# Patient Record
Sex: Female | Born: 1970 | Race: White | Hispanic: No | Marital: Married | Smoking: Never smoker
Health system: Southern US, Community
[De-identification: ages and names within clinical notes are randomized; demographics above are authoritative.]

## PROBLEM LIST (undated history)

## (undated) DIAGNOSIS — J302 Other seasonal allergic rhinitis: Secondary | ICD-10-CM

## (undated) DIAGNOSIS — E119 Type 2 diabetes mellitus without complications: Secondary | ICD-10-CM

## (undated) DIAGNOSIS — T7840XA Allergy, unspecified, initial encounter: Secondary | ICD-10-CM

## (undated) DIAGNOSIS — R112 Nausea with vomiting, unspecified: Secondary | ICD-10-CM

## (undated) DIAGNOSIS — Z9889 Other specified postprocedural states: Secondary | ICD-10-CM

## (undated) DIAGNOSIS — R7303 Prediabetes: Secondary | ICD-10-CM

## (undated) HISTORY — DX: Other seasonal allergic rhinitis: J30.2

## (undated) HISTORY — DX: Type 2 diabetes mellitus without complications: E11.9

## (undated) HISTORY — DX: Nausea with vomiting, unspecified: R11.2

## (undated) HISTORY — DX: Prediabetes: R73.03

## (undated) HISTORY — DX: Allergy, unspecified, initial encounter: T78.40XA

## (undated) HISTORY — DX: Other specified postprocedural states: Z98.890

---
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
## 1996-08-27 HISTORY — PX: BREAST REDUCTION SURGERY: SHX8

## 2007-10-31 ENCOUNTER — Emergency Department (HOSPITAL_COMMUNITY): Admission: EM | Admit: 2007-10-31 | Discharge: 2007-10-31 | Payer: Self-pay | Admitting: Family Medicine

## 2009-10-11 ENCOUNTER — Emergency Department (HOSPITAL_COMMUNITY): Admission: EM | Admit: 2009-10-11 | Discharge: 2009-10-11 | Payer: Self-pay | Admitting: Family Medicine

## 2010-01-28 ENCOUNTER — Emergency Department (HOSPITAL_COMMUNITY): Admission: EM | Admit: 2010-01-28 | Discharge: 2010-01-28 | Payer: Self-pay | Admitting: Emergency Medicine

## 2010-04-01 ENCOUNTER — Ambulatory Visit (HOSPITAL_COMMUNITY): Admission: RE | Admit: 2010-04-01 | Discharge: 2010-04-01 | Payer: Self-pay | Admitting: Family Medicine

## 2010-04-01 ENCOUNTER — Emergency Department (HOSPITAL_COMMUNITY): Admission: EM | Admit: 2010-04-01 | Discharge: 2010-04-01 | Payer: Self-pay | Admitting: Family Medicine

## 2010-04-01 IMAGING — CR DG ANKLE COMPLETE 3+V*L*
3 series · 3 of 3 positions shown · non-contrast
Comparison: None

CLINICAL DATA: Lateral pain, swelling, and bruising status post
fall

LEFT ANKLE COMPLETE - 3+ VIEW

[t ankle joint ap left]
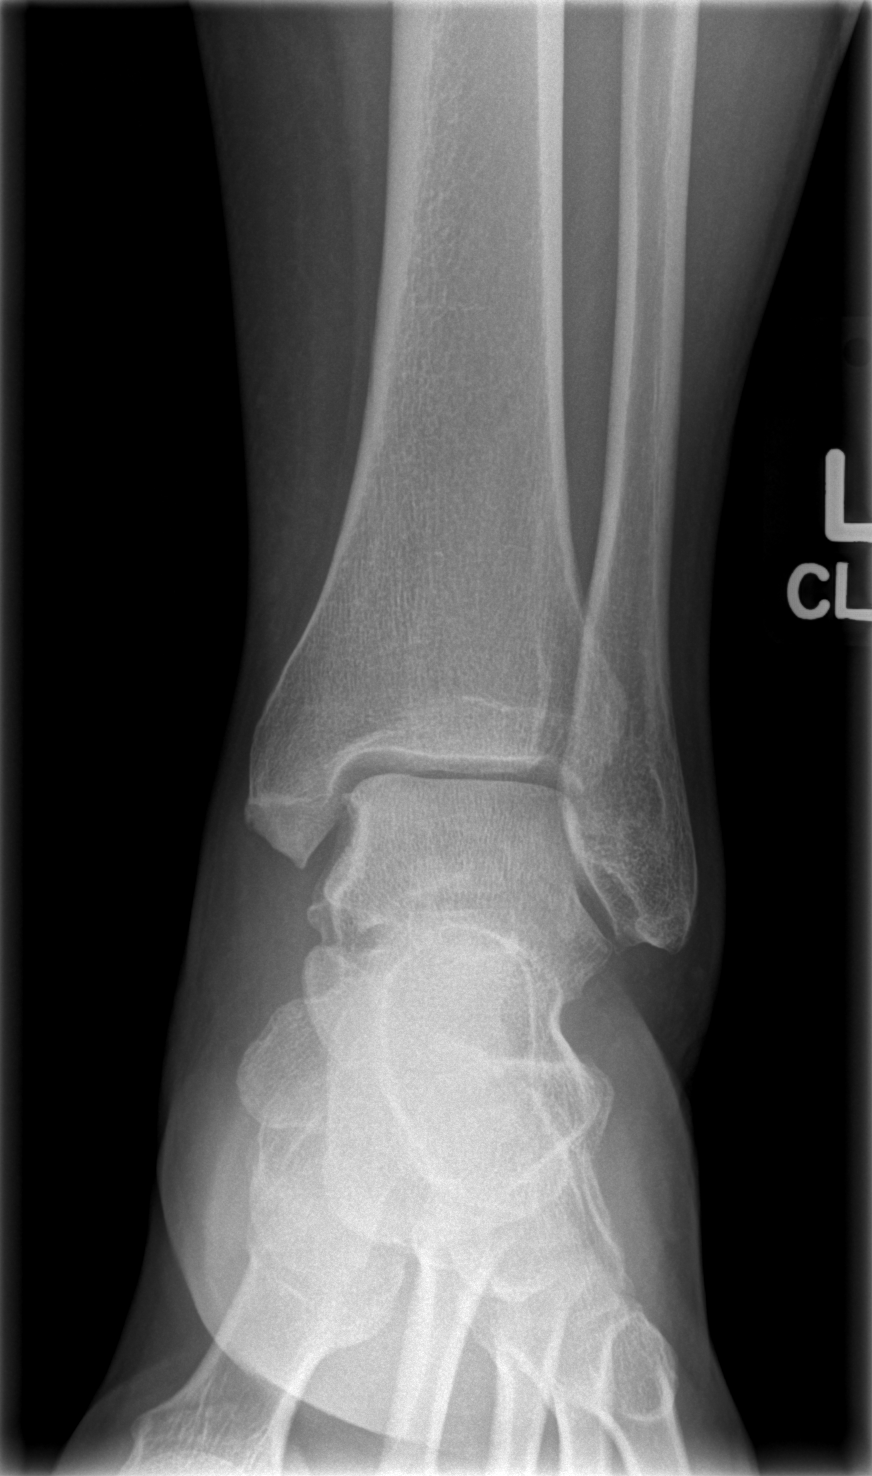

[t ankle joint oblique left]
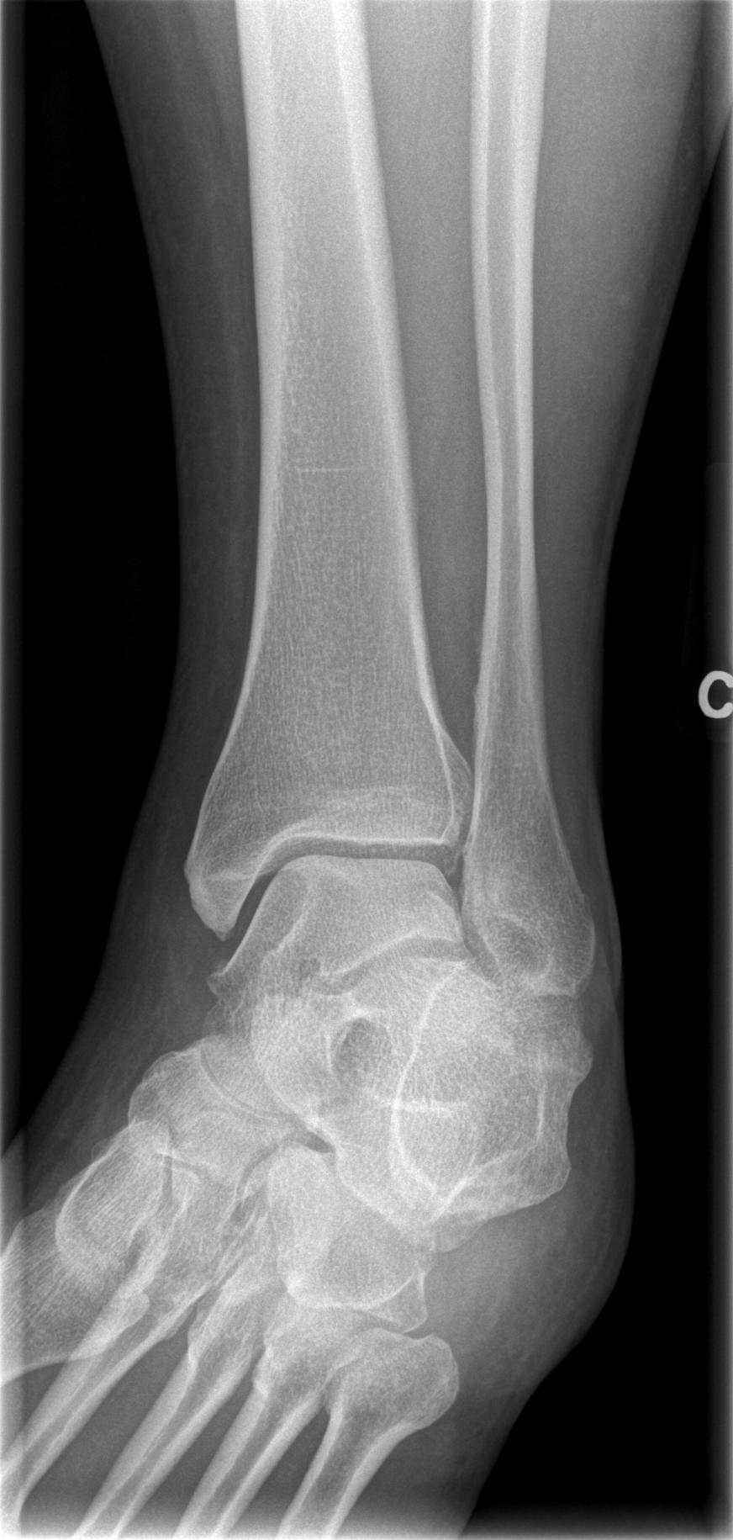

[t ankle joint lat left]
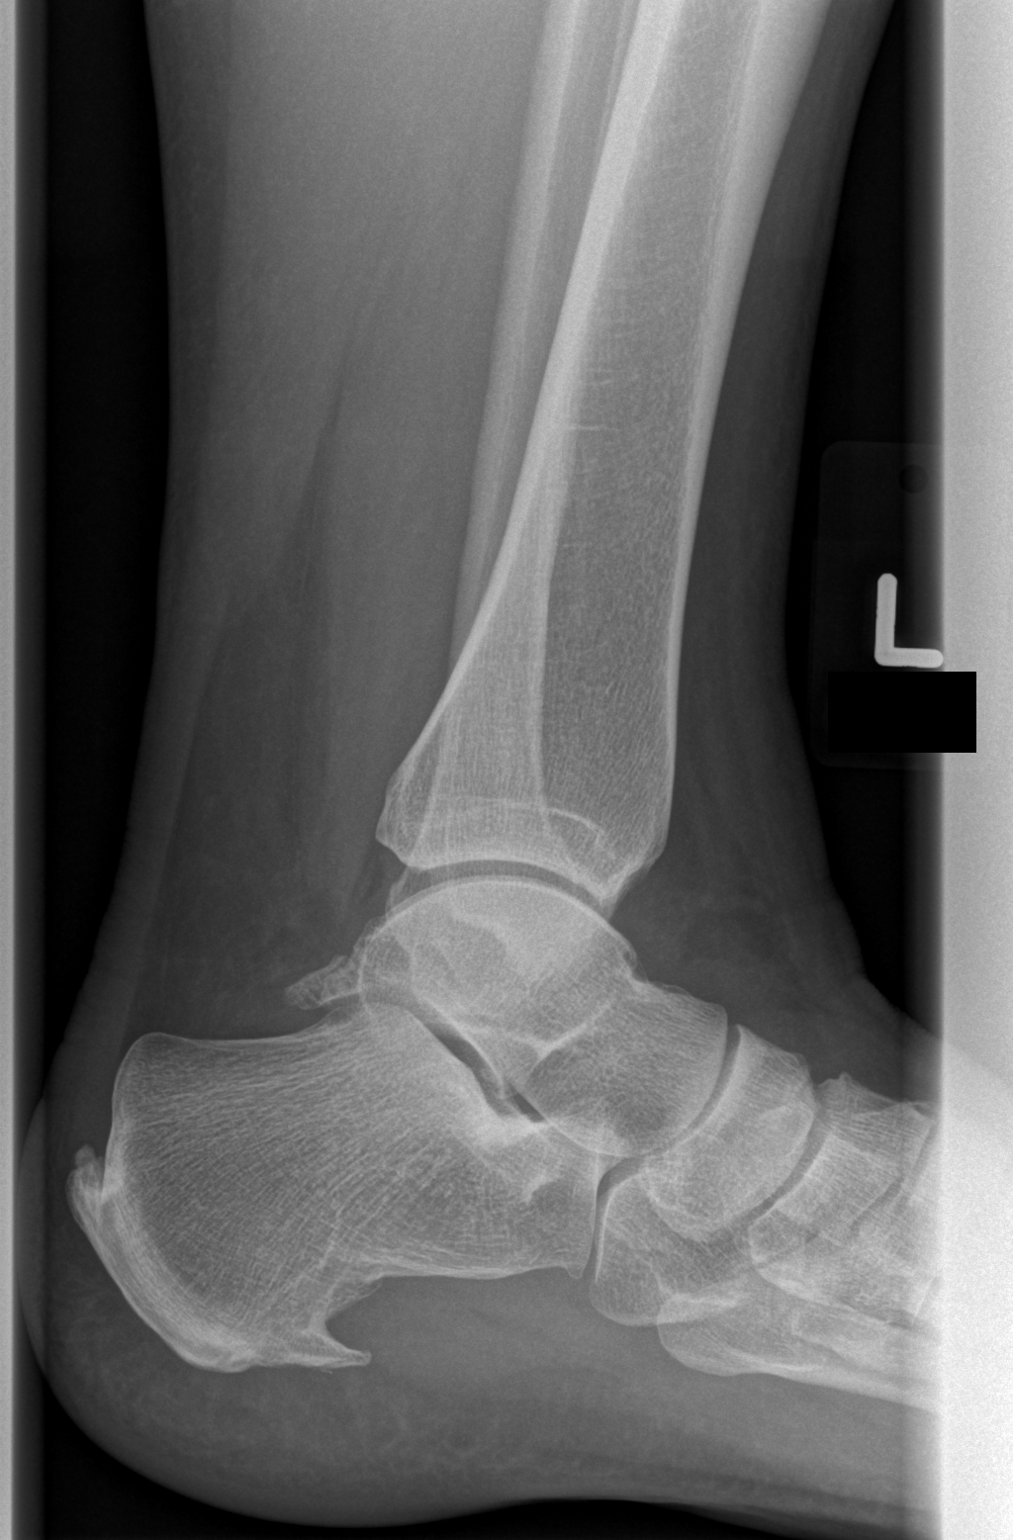

[3 of 3 positions shown; findings below may reference images not displayed]

FINDINGS: Ankle mortise intact.
Minimal soft tissue swelling.
Bone mineralization grossly normal.
No definite acute fracture, dislocation, or bone destruction.
IMPRESSION: No acute bony abnormalities.

## 2010-04-01 IMAGING — CR DG FOOT COMPLETE 3+V*L*
3 series · 3 of 3 positions shown · non-contrast
Comparison: None

CLINICAL DATA: Lateral pain, swelling and bruising status post fall

LEFT FOOT - COMPLETE 3+ VIEW

[t foot ap left]
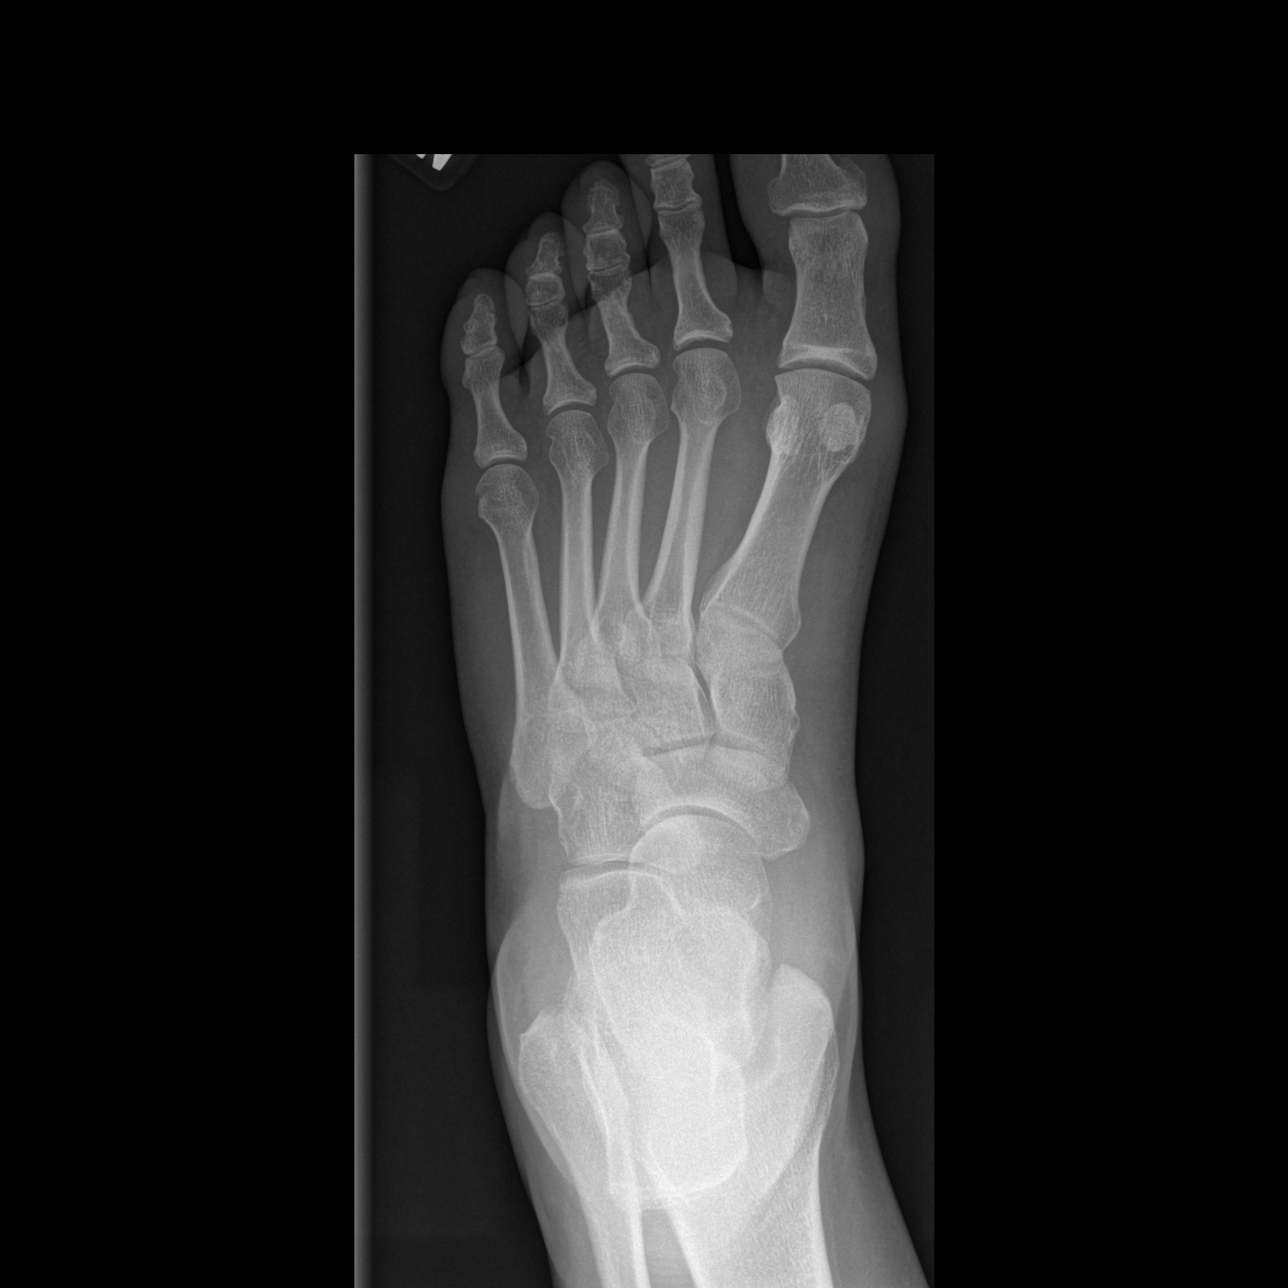

[t foot oblique left]
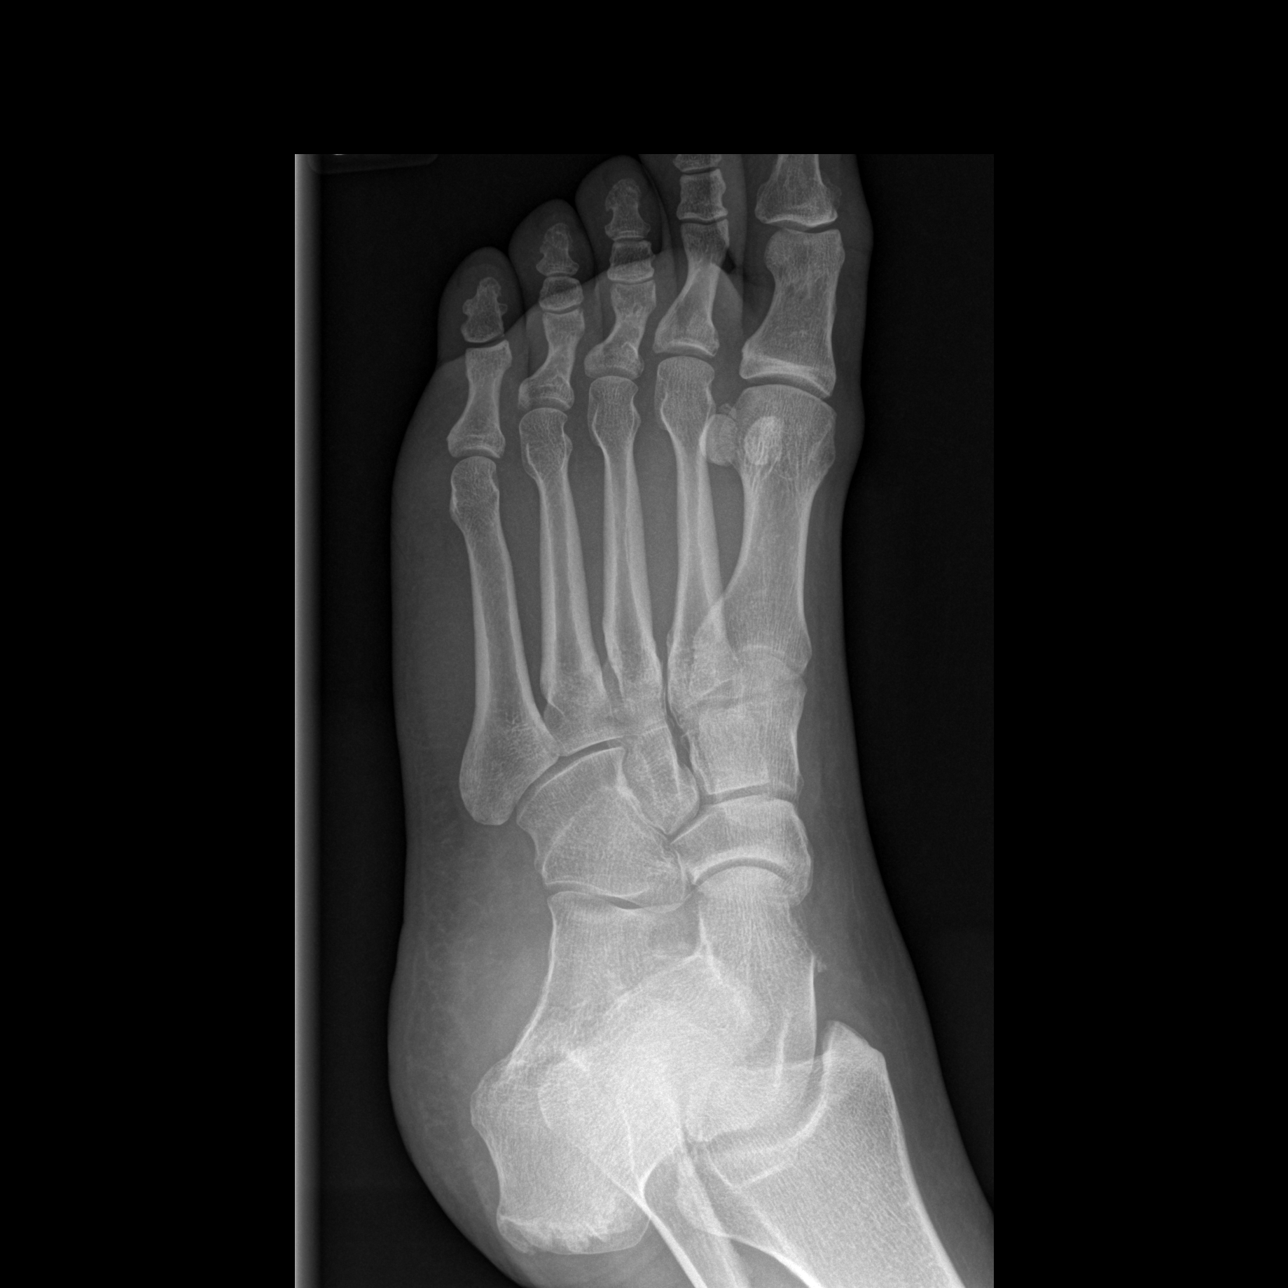

[t foot lat left]
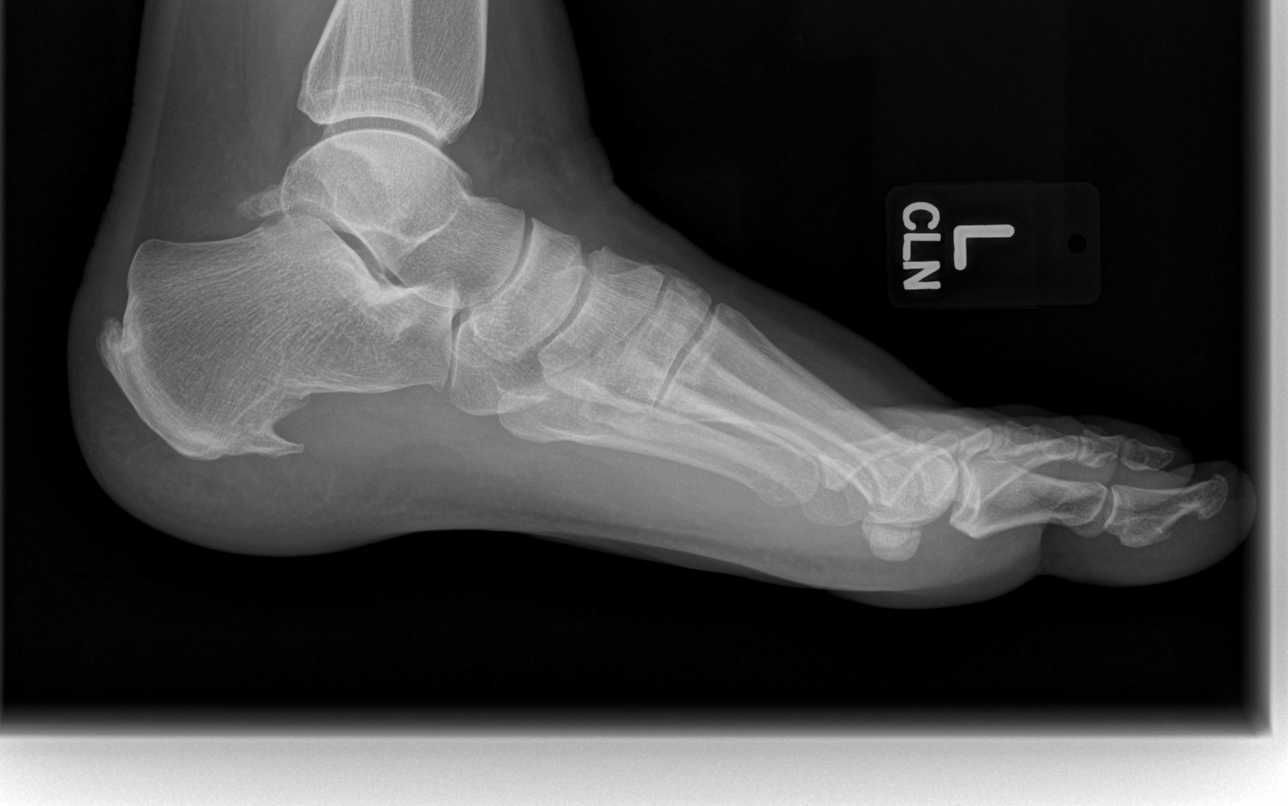

[3 of 3 positions shown; findings below may reference images not displayed]

FINDINGS: Achilles insertion and plantar calcaneal spurs.
Bone mineralization normal.
Joint spaces preserved.
No acute fracture, dislocation, or bone destruction.
IMPRESSION: No acute bony abnormalities.

## 2010-07-11 ENCOUNTER — Ambulatory Visit: Payer: Self-pay | Admitting: Family Medicine

## 2010-07-11 DIAGNOSIS — J309 Allergic rhinitis, unspecified: Secondary | ICD-10-CM | POA: Insufficient documentation

## 2010-07-17 LAB — CONVERTED CEMR LAB: IgE (Immunoglobulin E), Serum: 134.6 intl units/mL (ref 0.0–180.0)

## 2010-08-25 ENCOUNTER — Telehealth: Payer: Self-pay | Admitting: Family Medicine

## 2010-09-17 ENCOUNTER — Emergency Department (HOSPITAL_COMMUNITY)
Admission: EM | Admit: 2010-09-17 | Discharge: 2010-09-17 | Payer: Self-pay | Source: Home / Self Care | Admitting: Emergency Medicine

## 2010-09-26 NOTE — Assessment & Plan Note (Signed)
Summary: new to estab/cbs   Vital Signs:  Patient profile:   40 year old female Menstrual status:  regular LMP:     06/30/2010 Height:      62 inches Weight:      198.0 pounds BMI:     36.35 Temp:     97.6 degrees F oral Pulse rate:   88 / minute Pulse rhythm:   regular BP sitting:   112 / 74  (right arm) Cuff size:   regular  Vitals Entered By: Almeta Monas CMA Duncan Dull) (July 11, 2010 10:13 AM) CC: New est-- c/o allergies, congestion, itchy eyes, post nasal drainage, URI symptoms Comments Last pap smear was in March 2011. LMP (date): 06/30/2010     Menstrual Status regular Enter LMP: 06/30/2010   History of Present Illness:       This is a 40 year old woman who presents with URI symptoms.  The symptoms began >1 year ago.  Pt c/o year round red eyes and stuffy nose---- worse in fall and better in summer but never goes away completely.  The patient complains of nasal congestion, but denies clear nasal discharge, purulent nasal discharge, sore throat, dry cough, productive cough, earache, and sick contacts.  The patient denies fever, low-grade fever (<100.5 degrees), fever of 100.5-103 degrees, fever of 103.1-104 degrees, fever to >104 degrees, stiff neck, dyspnea, wheezing, rash, vomiting, diarrhea, use of an antipyretic, and response to antipyretic.  The patient also reports itchy watery eyes, itchy throat, sneezing, and response to antihistamine.  The patient denies the following risk factors for Strep sinusitis: unilateral facial pain, unilateral nasal discharge, poor response to decongestant, double sickening, tooth pain, Strep exposure, tender adenopathy, and absence of cough.  Pt has tried claritin D with no relief and is currently taking zyrtec and nasonex.      Preventive Screening-Counseling & Management  Alcohol-Tobacco     Smoking Status: never  Caffeine-Diet-Exercise     Does Patient Exercise: no      Drug Use:  no.    Current Medications (verified): 1)  Xyzal  5 Mg Tabs (Levocetirizine Dihydrochloride) .Marland Kitchen.. 1 By Mouth Once Daily As Needed 2)  Levora 0.15/30 (28) 0.15-30 Mg-Mcg Tabs (Levonorgestrel-Ethinyl Estrad) .Marland Kitchen.. 1 By Mouth Once Daily 3)  Nasonex 50 Mcg/act Susp (Mometasone Furoate) .... 2 Sprays Each Nostril Once Daily 4)  Astepro 0.15 % Soln (Azelastine Hcl) .... 2 Sprays Each  Nostril Once Daily  Allergies (verified): No Known Drug Allergies  Past History:  Family History: Last updated: 07/11/2010 Family History Breast cancer 1st degree relative <50---Mother Family History Diabetes 1st degree relative--Father Family History High cholesterol--Brother Family History Hypertension--Father and Brother  Social History: Last updated: 07/11/2010 Occupation:-- radiology tech Married Never Smoked Alcohol use-no Drug use-no Regular exercise-no  Risk Factors: Exercise: no (07/11/2010)  Risk Factors: Smoking Status: never (07/11/2010)  Past Medical History: Allergic rhinitis  Past Surgical History: Adenoids removed-- 1978 Caesarean section--2001 Breast Reduction--1998  Family History: Reviewed history and no changes required. Family History Breast cancer 1st degree relative <50---Mother Family History Diabetes 1st degree relative--Father Family History High cholesterol--Brother Family History Hypertension--Father and Brother  Social History: Reviewed history and no changes required. Occupation:-- radiology tech Married Never Smoked Alcohol use-no Drug use-no Regular exercise-no Occupation:  employed Smoking Status:  never Drug Use:  no Does Patient Exercise:  no  Review of Systems      See HPI  Physical Exam  General:  Well-developed,well-nourished,in no acute distress; alert,appropriate and cooperative throughout examination Ears:  External ear exam shows no significant lesions or deformities.  Otoscopic examination reveals clear canals, tympanic membranes are intact bilaterally without bulging, retraction,  inflammation or discharge. Hearing is grossly normal bilaterally. Nose:  no external deformity, mucosal erythema, and mucosal edema.   Mouth:  pharyngeal erythema and postnasal drip.   Neck:  No deformities, masses, or tenderness noted. Lungs:  Normal respiratory effort, chest expands symmetrically. Lungs are clear to auscultation, no crackles or wheezes. Heart:  Normal rate and regular rhythm. S1 and S2 normal without gallop, murmur, click, rub or other extra sounds. Cervical Nodes:  No lymphadenopathy noted Psych:  Cognition and judgment appear intact. Alert and cooperative with normal attention span and concentration. No apparent delusions, illusions, hallucinations   Impression & Recommendations:  Problem # 1:  ALLERGIC RHINITIS (ICD-477.9)  Her updated medication list for this problem includes:    Xyzal 5 Mg Tabs (Levocetirizine dihydrochloride) .Marland Kitchen... 1 by mouth once daily as needed    Nasonex 50 Mcg/act Susp (Mometasone furoate) .Marland Kitchen... 2 sprays each nostril once daily    Astepro 0.15 % Soln (Azelastine hcl) .Marland Kitchen... 2 sprays each  nostril once daily  Orders: Venipuncture ([address]6578) T-Allergy Profile Region [place], [place], [place], [place], [place] [phone])  Discussed use of allergy medications and environmental measures.   Complete Medication List: 1)  Xyzal 5 Mg Tabs (Levocetirizine dihydrochloride) .Marland Kitchen.. 1 by mouth once daily as needed 2)  Levora 0.15/30 (28) 0.15-30 Mg-mcg Tabs (Levonorgestrel-ethinyl estrad) .Marland Kitchen.. 1 by mouth once daily 3)  Nasonex 50 Mcg/act Susp (Mometasone furoate) .... 2 sprays each nostril once daily 4)  Astepro 0.15 % Soln (Azelastine hcl) .... 2 sprays each  nostril once daily  Patient Instructions: 1)  con't nasonex 2)  change to xyzal daily 3)  add astepro  4)  rto as needed  Prescriptions: XYZAL 5 MG TABS (LEVOCETIRIZINE DIHYDROCHLORIDE) 1 by mouth once daily as needed  #30 x 5   Entered and Authorized by:   Loreen Freud DO   Signed by:   Loreen Freud DO on 07/11/2010    Method used:   Electronically to        [facility]* (retail)       [address]       [address] Shipping/mailing       [place], [place]  [address]       Ph: [phone]       Fax: [phone]   RxID:   [id number]    Orders Added: 1)  Venipuncture [29518] 2)  T-Allergy Profile Region [place], [place], [place], [place], [place] [8416] 3)  New Patient Level III [[address]30]   Immunization History:  Tetanus/Td Immunization History:    Tetanus/Td:  historical (01/26/2004)   Immunization History:  Tetanus/Td Immunization History:    Tetanus/Td:  Historical (01/26/2004)

## 2010-09-28 NOTE — Progress Notes (Signed)
Summary: Rx   Phone Note Refill Request Call back at Work Phone [phone] Message from:  Patient  Refills Requested: Medication #1:  ASTEPRO 0.15 % SOLN 2 sprays each  nostril once daily. Pt was given sample of med and would now like a Rx. Pt states that this med works better than the nasonex and would like to know if it would be possible to use it in AM instead . Pls advise ...  [facility]..........Marland KitchenFelecia Deloach CMA  August 25, 2010 12:59 PM    Follow-up for Phone Call        that is fine Follow-up by: Loreen Freud DO,  August 25, 2010 1:06 PM    Prescriptions: ASTEPRO 0.15 % SOLN (AZELASTINE HCL) 2 sprays each  nostril once daily  #1 x 5   Entered and Authorized by:   Loreen Freud DO   Signed by:   Loreen Freud DO on 08/25/2010   Method used:   Electronically to        [facility]* (retail)       [address]       [address] Shipping/mailing       [place], [place]  [address]       Ph: [phone]       Fax: [phone]   RxID:   [id number]

## 2010-09-28 NOTE — Medication Information (Signed)
Summary: Care Consideration Regarding Levocetirizine Dihydrochloride/[facility]  Care Consideration Regarding Levocetirizine Dihydrochloride/[facility]   Imported By: Lanelle Bal 08/30/2010 14:27:12  _____________________________________________________________________  External Attachment:    Type:   Image     Comment:   External Document

## 2010-10-09 ENCOUNTER — Encounter: Payer: Self-pay | Admitting: Family Medicine

## 2010-10-09 ENCOUNTER — Ambulatory Visit (INDEPENDENT_AMBULATORY_CARE_PROVIDER_SITE_OTHER): Payer: 59 | Admitting: Family Medicine

## 2010-10-09 DIAGNOSIS — J019 Acute sinusitis, unspecified: Secondary | ICD-10-CM

## 2010-10-09 DIAGNOSIS — J0140 Acute pansinusitis, unspecified: Secondary | ICD-10-CM | POA: Insufficient documentation

## 2010-10-10 ENCOUNTER — Telehealth: Payer: Self-pay | Admitting: Family Medicine

## 2010-10-17 ENCOUNTER — Institutional Professional Consult (permissible substitution): Payer: Self-pay | Admitting: Internal Medicine

## 2010-10-18 NOTE — Assessment & Plan Note (Signed)
Summary: Sinus inf/URI not getting better/kb   Vital Signs:  Patient profile:   40 year old female Menstrual status:  regular Weight:      200.2 pounds Temp:     98.0 degrees F oral BP sitting:   116 / 72  (left arm) Cuff size:   regular  Vitals Entered By: Cassidy Gibson) (October 09, 2010 3:39 PM) CC: x3 weeks ago patient was treated for Sinusitis with Augmentin---symptoms came back, URI symptoms   History of Present Illness:       This is a 40 year old woman who presents with URI symptoms.  The symptoms began 2 weeks ago.  b/l sinus drainage.  The patient complains of nasal congestion, purulent nasal discharge, productive cough, earache, and sick contacts.  The patient denies fever, low-grade fever (<100.5 degrees), fever of 100.5-103 degrees, fever of 103.1-104 degrees, fever to >104 degrees, stiff neck, dyspnea, wheezing, rash, vomiting, diarrhea, use of an antipyretic, and response to antipyretic.  The patient also reports headache.  The patient denies the following risk factors for Strep sinusitis: unilateral facial pain, unilateral nasal discharge, poor response to decongestant, double sickening, tooth pain, Strep exposure, tender adenopathy, and absence of cough.    Problems Prior to Update: 1)  Sinusitis - Acute-nos  (ICD-461.9) 2)  Allergic Rhinitis  (ICD-477.9) 3)  Family History Diabetes 1st Degree Relative  (ICD-V18.0) 4)  Family History Breast Cancer 1st Degree Relative <50  (ICD-V16.3)  Medications Prior to Update: 1)  Xyzal 5 Mg Tabs (Levocetirizine Dihydrochloride) .Marland Kitchen.. 1 By Mouth Once Daily As Needed 2)  Levora 0.15/30 (28) 0.15-30 Mg-Mcg Tabs (Levonorgestrel-Ethinyl Estrad) .Marland Kitchen.. 1 By Mouth Once Daily 3)  Astepro 0.15 % Soln (Azelastine Hcl) .... 2 Sprays Each  Nostril Once Daily  Current Medications (verified): 1)  Xyzal 5 Mg Tabs (Levocetirizine Dihydrochloride) .Marland Kitchen.. 1 By Mouth Once Daily As Needed 2)  Levora 0.15/30 (28) 0.15-30 Mg-Mcg Tabs  (Levonorgestrel-Ethinyl Estrad) .Marland Kitchen.. 1 By Mouth Once Daily 3)  Astepro 0.15 % Soln (Azelastine Hcl) .... 2 Sprays Each  Nostril Once Daily 4)  Avelox 400 Mg Tabs (Moxifloxacin Hcl) .Marland Kitchen.. 1 By Mouth Once Daily 5)  Prednisone 10 Mg Tabs (Prednisone) .... 3 By Mouth Once Daily For 3 Days Then 2 By Mouth Once Daily For 3 Days Then 1 By Mouth Once Daily For 3 Days  Allergies (verified): No Known Drug Allergies  Past History:  Past medical, surgical, family and social histories (including risk factors) reviewed for relevance to current acute and chronic problems.  Past Medical History: Reviewed history from 07/11/2010 and no changes required. Allergic rhinitis  Past Surgical History: Reviewed history from 07/11/2010 and no changes required. Adenoids removed-- 1978 Caesarean section--2001 Breast Reduction--1998  Family History: Reviewed history from 07/11/2010 and no changes required. Family History Breast cancer 1st degree relative <50---Mother Family History Diabetes 1st degree relative--Father Family History High cholesterol--Brother Family History Hypertension--Father and Brother  Social History: Reviewed history from 07/11/2010 and no changes required. Occupation:-- radiology tech Married Never Smoked Alcohol use-no Drug use-no Regular exercise-no  Review of Systems      See HPI  Physical Exam  General:  Well-developed,well-nourished,in no acute distress; alert,appropriate and cooperative throughout examination Ears:  External ear exam shows no significant lesions or deformities.  Otoscopic examination reveals clear canals, tympanic membranes are intact bilaterally without bulging, retraction, inflammation or discharge. Hearing is grossly normal bilaterally. Nose:  no external deformity, L frontal sinus tenderness, L maxillary sinus tenderness, R frontal sinus  tenderness, and R maxillary sinus tenderness.   Mouth:  Oral mucosa and oropharynx without lesions or exudates.   Teeth in good repair. Neck:  No deformities, masses, or tenderness noted. Lungs:  Normal respiratory effort, chest expands symmetrically. Lungs are clear to auscultation, no crackles or wheezes. Heart:  Normal rate and regular rhythm. S1 and S2 normal without gallop, murmur, click, rub or other extra sounds. Psych:  Cognition and judgment appear intact. Alert and cooperative with normal attention span and concentration. No apparent delusions, illusions, hallucinations   Impression & Recommendations:  Problem # 1:  SINUSITIS - ACUTE-NOS (ICD-461.9)  Her updated medication list for this problem includes:    Astepro 0.15 % Soln (Azelastine hcl) .Marland Kitchen... 2 sprays each  nostril once daily    Avelox 400 Mg Tabs (Moxifloxacin hcl) .Marland Kitchen... 1 by mouth once daily  Orders: Depo- Medrol 80mg  (J1040) Admin of Therapeutic Inj  intramuscular or subcutaneous (55732)  Instructed on treatment. Call if symptoms persist or worsen.   Complete Medication List: 1)  Xyzal 5 Mg Tabs (Levocetirizine dihydrochloride) .Marland Kitchen.. 1 by mouth once daily as needed 2)  Levora 0.15/30 (28) 0.15-30 Mg-mcg Tabs (Levonorgestrel-ethinyl estrad) .Marland Kitchen.. 1 by mouth once daily 3)  Astepro 0.15 % Soln (Azelastine hcl) .... 2 sprays each  nostril once daily 4)  Avelox 400 Mg Tabs (Moxifloxacin hcl) .Marland Kitchen.. 1 by mouth once daily 5)  Prednisone 10 Mg Tabs (Prednisone) .... 3 by mouth once daily for 3 days then 2 by mouth once daily for 3 days then 1 by mouth once daily for 3 days 6)  Diflucan 150 Mg Tabs (Fluconazole) .... Take 1 by mouth once daily x1, may repeat in 3 days as needed Prescriptions: PREDNISONE 10 MG TABS (PREDNISONE) 3 by mouth once daily for 3 days then 2 by mouth once daily for 3 days then 1 by mouth once daily for 3 days  #18 x 0   Entered and Authorized by:   Loreen Freud DO   Signed by:   Loreen Freud DO on 10/09/2010   Method used:   Electronically to        [facility] (retail)       [address]        [place], [place]  [address]       Ph: [phone]       Fax: [phone]   RxID:   [id number] AVELOX 400 MG TABS (MOXIFLOXACIN HCL) 1 by mouth once daily  #10 x 0   Entered and Authorized by:   Loreen Freud DO   Signed by:   Loreen Freud DO on 10/09/2010   Method used:   Electronically to        [facility] (retail)       [address]       [place], [place]  [address]       Ph: [phone]       Fax: [phone]   RxID:   [id number]    Medication Administration  Injection # 1:    Medication: Depo- Medrol 80mg     Diagnosis: SINUSITIS - ACUTE-NOS (ICD-461.9)    Route: IM    Site: RUOQ gluteus    Exp Date: 02/25/2011    Lot #: [id number]    Mfr: Pharmacia    Patient tolerated injection without complications    Given by: Cassidy Gibson) (October 09, 2010 4:24 PM)  Orders Added: 1)  Est. Patient Level III [57846] 2)  Depo- Medrol 80mg  [J1040] 3)  Admin of Therapeutic Inj  intramuscular or subcutaneous [96295]

## 2010-10-18 NOTE — Progress Notes (Signed)
Summary: Rx   Phone Note Call from Patient   Caller: Patient Summary of Call: Pt would like to have a Rx for diflucan call in to pharmacy. Pt note that she was just on a antibiotics that caused her to get a yeast infection and now she being put on another one. [facility]. Pls advise.........Marland KitchenFelecia Deloach CMA  October 10, 2010 12:00 PM   Follow-up for Phone Call        diflucan 150mg   1 by mouth once daily x1, may repeat in 3 days as needed #2 Follow-up by: Loreen Freud DO,  October 10, 2010 12:05 PM  Additional Follow-up for Phone Call Additional follow up Details #1::        Left Pt detail message Rx sent to pharmacy.......Marland KitchenFelecia Deloach CMA  October 10, 2010 3:07 PM     New/Updated Medications: DIFLUCAN 150 MG TABS (FLUCONAZOLE) Take 1 by mouth once daily x1, may repeat in 3 days as needed #2 DIFLUCAN 150 MG TABS (FLUCONAZOLE) Take 1 by mouth once daily x1, may repeat in 3 days as needed Prescriptions: DIFLUCAN 150 MG TABS (FLUCONAZOLE) Take 1 by mouth once daily x1, may repeat in 3 days as needed  #2 x 0   Entered by:   Jeremy Johann CMA   Authorized by:   Loreen Freud DO   Signed by:   Jeremy Johann CMA on 10/10/2010   Method used:   Faxed to ...       [facility]* (retail)       [address]       [address] Shipping/mailing       [place], [place]  [address]       Ph: [phone]       Fax: [phone]   RxID:   [id number]

## 2011-02-23 ENCOUNTER — Telehealth: Payer: Self-pay

## 2011-02-23 NOTE — Telephone Encounter (Signed)
She would need an appointment

## 2011-02-23 NOTE — Telephone Encounter (Signed)
mssg left on work phone for return call     KP

## 2011-02-23 NOTE — Telephone Encounter (Signed)
Pt notified and will come on Monday.

## 2011-02-23 NOTE — Telephone Encounter (Signed)
Message from patient and she stated she is currently awaiting an appointment with the ENT and she is having the same symptoms that she was having when she was treated in March for a sinus infection. Her appt is July 30 and wanted to know if she could get something called in or will she have to come back in for an appt.    Please advise   KP

## 2011-02-26 ENCOUNTER — Ambulatory Visit (INDEPENDENT_AMBULATORY_CARE_PROVIDER_SITE_OTHER): Payer: 59 | Admitting: Family Medicine

## 2011-02-26 ENCOUNTER — Encounter: Payer: Self-pay | Admitting: Family Medicine

## 2011-02-26 VITALS — BP 118/72 | HR 87 | Temp 98.4°F | Wt 200.0 lb

## 2011-02-26 DIAGNOSIS — J329 Chronic sinusitis, unspecified: Secondary | ICD-10-CM

## 2011-02-26 MED ORDER — CEFUROXIME AXETIL 500 MG PO TABS: 500.0000 mg | ORAL_TABLET | Freq: Two times a day (BID) | ORAL | Status: AC

## 2011-02-26 MED ORDER — FLUTICASONE FUROATE 27.5 MCG/SPRAY NA SUSP: 2.0000 | Freq: Every day | NASAL | Status: DC

## 2011-02-26 MED ORDER — METHYLPREDNISOLONE ACETATE 80 MG/ML IJ SUSP
80.0000 mg | Freq: Once | INTRAMUSCULAR | Status: AC
  Administered 2011-02-26: 80 mg via INTRAMUSCULAR

## 2011-02-26 NOTE — Progress Notes (Signed)
  Subjective:     Cassidy Gibson is a 40 y.o. female who presents for evaluation of sinus pain. Symptoms include: congestion, facial pain, nasal congestion and sinus pressure. Onset of symptoms was 3 weeks ago. Symptoms have been gradually worsening since that time. Past history is significant for no history of pneumonia or bronchitis. Patient is a non-smoker.  The following portions of the patient's history were reviewed and updated as appropriate: allergies, current medications, past family history, past medical history, past social history, past surgical history and problem list.  Review of Systems Pertinent items are noted in HPI.   Objective:    BP 118/72  Pulse 87  Temp(Src) 98.4 F (36.9 C) (Oral)  Wt 200 lb (90.719 kg)  SpO2 97% General appearance: alert, cooperative, appears stated age and no distress Ears: normal TM's and external ear canals both ears Nose: Nares normal. Septum midline. Mucosa normal. No drainage or sinus tenderness., green discharge, mild congestion, turbinates red, swollen, sinus tenderness bilateral Throat: abnormal findings: mild oropharyngeal erythema Neck: mild anterior cervical adenopathy, supple, symmetrical, trachea midline and thyroid not enlarged, symmetric, no tenderness/mass/nodules Lungs: clear to auscultation bilaterally Heart: regular rate and rhythm, S1, S2 normal, no murmur, click, rub or gallop Lymph nodes: Cervical adenopathy: b/l adenopathy    Assessment:    Acute bacterial sinusitis.    Plan:    Nasal steroids per medication orders. Ceftin per medication orders. Referred to ENT. ceftin

## 2011-02-26 NOTE — Progress Notes (Signed)
Addended by: Arnette Norris on: 02/26/2011 04:44 PM   Modules accepted: Orders

## 2011-02-26 NOTE — Patient Instructions (Signed)

## 2011-03-26 ENCOUNTER — Other Ambulatory Visit: Payer: Self-pay | Admitting: Family Medicine

## 2011-05-14 ENCOUNTER — Encounter: Payer: Self-pay | Admitting: Family Medicine

## 2011-05-14 ENCOUNTER — Ambulatory Visit (INDEPENDENT_AMBULATORY_CARE_PROVIDER_SITE_OTHER): Payer: 59 | Admitting: Family Medicine

## 2011-05-14 DIAGNOSIS — R81 Glycosuria: Secondary | ICD-10-CM

## 2011-05-14 DIAGNOSIS — R7309 Other abnormal glucose: Secondary | ICD-10-CM

## 2011-05-14 DIAGNOSIS — N949 Unspecified condition associated with female genital organs and menstrual cycle: Secondary | ICD-10-CM

## 2011-05-14 DIAGNOSIS — R1031 Right lower quadrant pain: Secondary | ICD-10-CM

## 2011-05-14 DIAGNOSIS — R739 Hyperglycemia, unspecified: Secondary | ICD-10-CM

## 2011-05-14 DIAGNOSIS — R102 Pelvic and perineal pain: Secondary | ICD-10-CM

## 2011-05-14 LAB — POCT URINALYSIS DIPSTICK
Leukocytes, UA: NEGATIVE
Protein, UA: 30
Spec Grav, UA: 1.01

## 2011-05-14 LAB — GLUCOSE, POCT (MANUAL RESULT ENTRY): POC Glucose: 174

## 2011-05-14 NOTE — Patient Instructions (Signed)
Diabetes Meal Planning Guide The diabetes meal planning guide is a tool to help you plan your meals and snacks. It is important for people with diabetes to manage their blood sugar levels. Choosing the right foods and the right amounts throughout your day will help control your blood sugar. Eating right can even help you improve your blood pressure and reach or maintain a healthy weight. CARBOHYDRATE COUNTING MADE EASY When you eat carbohydrates, they turn to sugar (glucose). This raises your blood sugar level. Counting carbohydrates can help you control this level so you feel better. When you plan your meals by counting carbohydrates, you can have more flexibility in what you eat and balance your medicine with your food intake. Carbohydrate counting simply means adding up the total amount of carbohydrate grams (g) in your meals or snacks. Try to eat about the same amount at each meal. Foods with carbohydrates are listed below. Each portion below is 1 carbohydrate serving or 15 grams of carbohydrates. Ask your dietician how many grams of carbohydrates you should eat at each meal or snack. Grains and Starches 1 slice bread 1/2 English muffin or hotdog/hamburger bun 3/4 cup cold cereal (unsweetened) 1/3 cup cooked pasta or rice 1/2 cup starchy vegetables (corn, potatoes, peas, beans, winter squash) 1 tortilla (6 inches) 1/4 bagel 1 waffle or pancake (size of a CD) 1/2 cup cooked cereal 4 to 6 small crackers *Whole grain is recommended Fruit 1 cup fresh unsweetened berries, melon, papaya, pineapple 1 small fresh fruit 1/2 banana or mango 1/2 cup fruit juice (4 ounces unsweetened) 1/2 cup canned fruit in natural juice or water 2 tablespoons dried fruit 12 to 15 grapes or cherries Milk and Yogurt 1 cup fat-free or 1% milk 1 cup soy milk 6 ounces light yogurt with sugar-free sweetener 6 ounces low-fat soy yogurt 6 ounces plain yogurt Vegetables 1 cup raw or 1/2 cup cooked is counted as 0  carbohydrates or a "free" food. If you eat 3 or more servings at one meal, count them as 1 carbohydrate serving. Other Carbohydrates 3/4 ounces chips or pretzels 1/2 cup ice cream or frozen yogurt 1/4 cup sherbet or sorbet 2 inch square cake, no frosting 1 tablespoon honey, sugar, jam, jelly, or syrup 2 small cookies 3 squares of graham crackers 3 cups popcorn 6 crackers 1 cup broth-based soup Count 1 cup casserole or other mixed foods as 2 carbohydrate servings. Foods with less than 20 calories in a serving may be counted as 0 carbohydrates or a "free" food. You may want to purchase a book or computer software that lists the carbohydrate gram counts of different foods. In addition, the nutrition facts panel on the labels of the foods you eat are a good source of this information. The label will tell you how big the serving size is and the total number of carbohydrate grams you will be eating per serving. Divide this number by 15 to obtain the number of carbohydrate servings in a portion. Remember: 1 carbohydrate serving equals 15 grams of carbohydrate. SERVING SIZES Measuring foods and serving sizes helps you make sure you are getting the right amount of food. The list below tells how big or small some common serving sizes are.  1 ounce (oz) of cheese.................................4 stacked dice.   2 to 3 oz cooked meat..................................Deck of cards.   1 teaspoon (tsp)............................................Tip of little finger.   1 tablespoon (tbs).........................................Thumb.   2 tbs.............................................................Golf ball.    cup...........................................................Half of a fist.   1 cup............................................................A fist.    SAMPLE DIABETES MEAL PLAN Below is a sample meal plan that includes foods from the grain and starches, dairy, vegetable, fruit, and  meat groups. A dietician can individualize a meal plan to fit your calorie needs and tell you the number of servings needed from each food group. However, controlling the total amount of carbohydrates in your meal or snack is more important than making sure you include all of the food groups at every meal. You may interchange carbohydrate containing foods (dairy, starches, and fruits). The meal plan below is an example of a 2000 calorie diet using carbohydrate counting. This meal plan has 17 carbohydrate servings (carb choices). Breakfast 1 cup oatmeal (2 carb choices) 3/4 cup light yogurt (1 carb choice) 1 cup blueberries (1 carb choice) 1/4 cup almonds  Snack 1 large apple (2 carb choices) 1 low-fat string cheese stick  Lunch Chicken breast salad:  1 cup spinach   1/4 cup chopped tomatoes   2 oz chicken breast, sliced   2 tbs low-fat Italian dressing  12 whole-wheat crackers (2 carb choices) 12 to 15 grapes (1 carb choice) 1 cup low-fat milk (1 carb choice)  Snack 1 cup carrots 1/2 cup hummus (1 carb choice)  Dinner 3 oz broiled salmon 1 cup brown rice (3 carb choices)  Snack 1 1/2 cups steamed broccoli (1 carb choice) drizzled with 1 tsp olive oil and lemon juice 1 cup light pudding (2 carb choices)  DIABETES MEAL PLANNING WORKSHEET Your dietician can use this worksheet to help you decide how many servings of foods and what types of foods are right for you.  Breakfast Food Group and Servings Carb Choices Grain/Starches _______________________________________ Dairy ______________________________________________ Vegetable _______________________________________ Fruit _______________________________________________ Meat _______________________________________________ Fat _____________________________________________ Lunch Food Group and Servings Carb Choices Grain/Starches ________________________________________ Dairy _______________________________________________ Fruit  ________________________________________________ Meat ________________________________________________ Fat _____________________________________________ Dinner Food Group and Servings Carb Choices Grain/Starches ________________________________________ Dairy _______________________________________________ Fruit ________________________________________________ Meat ________________________________________________ Fat _____________________________________________ Snacks Food Group and Servings Carb Choices Grain/Starches ________________________________________ Dairy _______________________________________________ Vegetable ________________________________________ Fruit ________________________________________________ Meat ________________________________________________ Fat _____________________________________________ Daily Totals Starches _________________________ Vegetable __________________________ Fruit ______________________________ Dairy ______________________________ Meat ______________________________ Fat ________________________________  Document Released: 05/10/2005 Document Re-Released: 01/31/2010 [employer] Patient Information 2011 [employer]. 

## 2011-05-14 NOTE — Progress Notes (Signed)
  Subjective:     Cassidy Gibson is a 40 y.o. female who presents for evaluation of abdominal pain. Onset was 7 days ago. Symptoms have been gradually worsening. The pain is described as sharp, and is 5/10 in intensity. Pain is located in the RLQ without radiation.  Aggravating factors: movement.  Alleviating factors: none. Associated symptoms: none. The patient denies anorexia, arthralagias, belching, chills, constipation, diarrhea, dysuria, fever, flatus, frequency, headache, hematochezia, hematuria, melena, myalgias, nausea, sweats and vomiting.  The patient's history has been marked as reviewed and updated as appropriate.  Review of Systems Pertinent items are noted in HPI.     Objective:    BP 110/72  Pulse 84  Temp(Src) 99 F (37.2 C) (Oral)  Wt 201 lb 9.6 oz (91.445 kg)  SpO2 97% General appearance: alert, cooperative, appears stated age and mild distress Lungs: clear to auscultation bilaterally Heart: S1, S2 normal Abdomen: normal findings: bowel sounds normal and soft and abnormal findings:  obese and moderate tenderness in the RLQ Pelvic: cervix normal in appearance, external genitalia normal, no adnexal masses or tenderness, no cervical motion tenderness, rectovaginal septum normal, uterus normal size, shape, and consistency and vagina normal without discharge Extremities: extremities normal, atraumatic, no cyanosis or edema Lymph nodes: Cervical, supraclavicular, and axillary nodes normal.    Assessment:    Abdominal pain, r/o ap Hyperglycemia---- check labs ,  Pt given glucometer, refer to nutrition   Plan:  Check labs Check CT  The diagnosis was discussed with the patient and evaluation and treatment plans outlined. See orders for lab and imaging studies. Adhere to simple, bland diet. Further follow-up plans will be based on outcome of lab/imaging studies; see orders.

## 2011-05-15 ENCOUNTER — Ambulatory Visit (HOSPITAL_COMMUNITY)
Admission: RE | Admit: 2011-05-15 | Discharge: 2011-05-15 | Disposition: A | Discharge status: Home / Self Care | Payer: 59 | Source: Ambulatory Visit | Attending: Family Medicine | Admitting: Family Medicine

## 2011-05-15 ENCOUNTER — Other Ambulatory Visit: Payer: Self-pay | Admitting: Family Medicine

## 2011-05-15 DIAGNOSIS — R109 Unspecified abdominal pain: Secondary | ICD-10-CM | POA: Insufficient documentation

## 2011-05-15 DIAGNOSIS — R319 Hematuria, unspecified: Secondary | ICD-10-CM | POA: Insufficient documentation

## 2011-05-15 DIAGNOSIS — D259 Leiomyoma of uterus, unspecified: Secondary | ICD-10-CM | POA: Insufficient documentation

## 2011-05-15 DIAGNOSIS — R1031 Right lower quadrant pain: Secondary | ICD-10-CM | POA: Insufficient documentation

## 2011-05-15 DIAGNOSIS — K7689 Other specified diseases of liver: Secondary | ICD-10-CM | POA: Insufficient documentation

## 2011-05-15 LAB — CBC WITH DIFFERENTIAL/PLATELET
Basophils Absolute: 0.1 10*3/uL (ref 0.0–0.1)
Basophils Relative: 0.6 % (ref 0.0–3.0)
Eosinophils Relative: 2.6 % (ref 0.0–5.0)
HCT: 41 % (ref 36.0–46.0)
Lymphocytes Relative: 34.7 % (ref 12.0–46.0)
MCV: 89.6 fl (ref 78.0–100.0)
Monocytes Absolute: 0.7 10*3/uL (ref 0.1–1.0)
Monocytes Relative: 6.6 % (ref 3.0–12.0)
Neutro Abs: 5.8 10*3/uL (ref 1.4–7.7)
Neutrophils Relative %: 55.5 % (ref 43.0–77.0)
RDW: 13.4 % (ref 11.5–14.6)
WBC: 10.5 10*3/uL (ref 4.5–10.5)

## 2011-05-15 LAB — HEPATIC FUNCTION PANEL
Total Bilirubin: 0.2 mg/dL — ABNORMAL LOW (ref 0.3–1.2)
Total Protein: 7.1 g/dL (ref 6.0–8.3)

## 2011-05-15 LAB — HEMOGLOBIN A1C: Hgb A1c MFr Bld: 6.3 % (ref 4.6–6.5)

## 2011-05-15 LAB — BASIC METABOLIC PANEL
Creatinine, Ser: 0.6 mg/dL (ref 0.4–1.2)
Potassium: 4.3 mEq/L (ref 3.5–5.1)

## 2011-05-15 IMAGING — CT CT ABD-PELV W/ CM
2 of 4 series · 16 of 46 positions shown, 18 images · IV contrast (APPLIED)
Comparison: None.

CLINICAL DATA: Pelvic pain and bloating.  Right lower quadrant
pain.  Trace hematuria.  Possible urinary tract infection.

CT ABDOMEN AND PELVIS WITH CONTRAST
TECHNIQUE: Multidetector CT imaging of the abdomen and pelvis was
performed following the standard protocol during bolus
administration of intravenous contrast.
Contrast: 100mL OMNIPAQUE IOHEXOL 300 MG/ML IV SOLN

[Series 2: abd/pelv with 5.0 b31f st · axial · 0.73mm/px · z∈[-488,-73]mm · 13 of 91 slices shown, 15 images]
[im 4/91  soft-tissue]
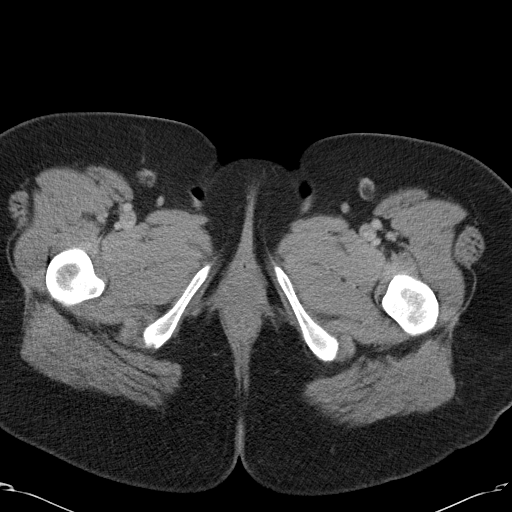
[im 4/91  bone]
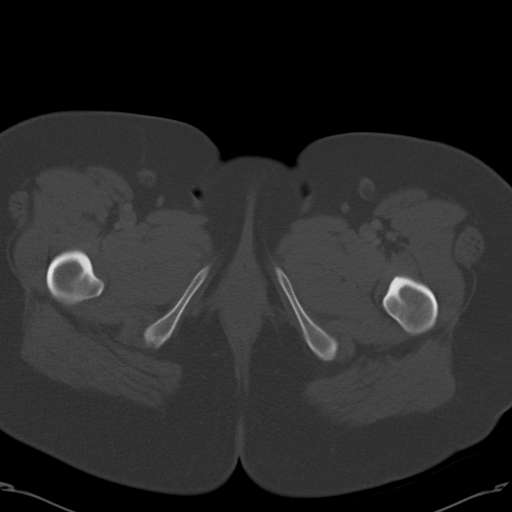
[im 11/91  soft-tissue]
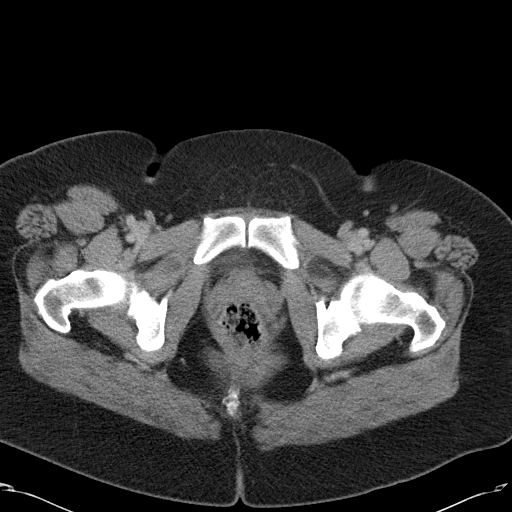
[im 19/91  soft-tissue]
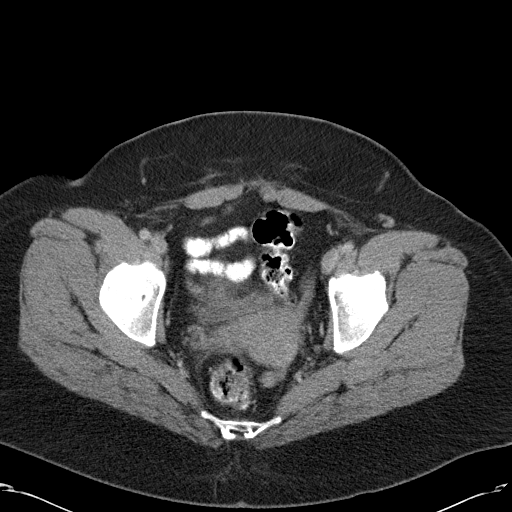
[im 26/91  soft-tissue]
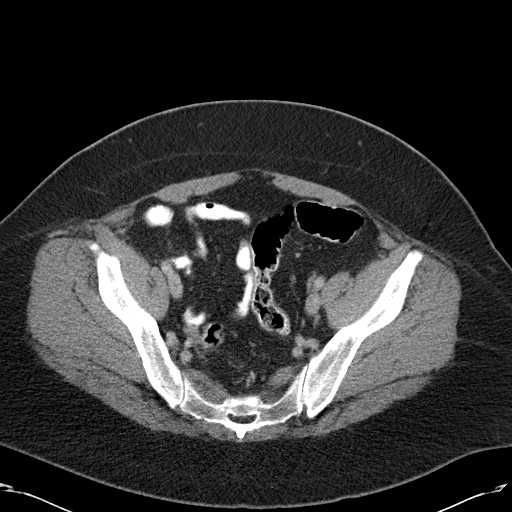
[im 33/91  soft-tissue]
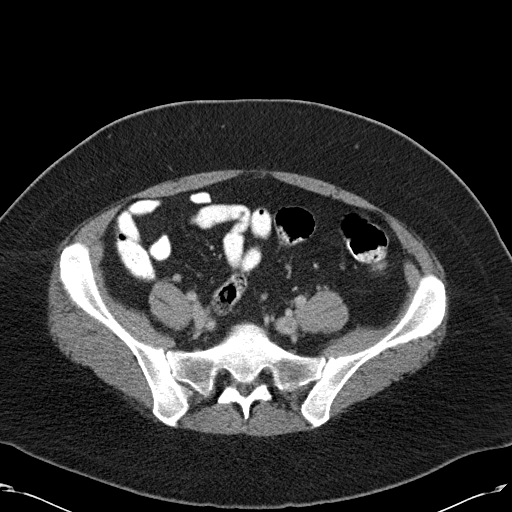
[im 40/91  soft-tissue]
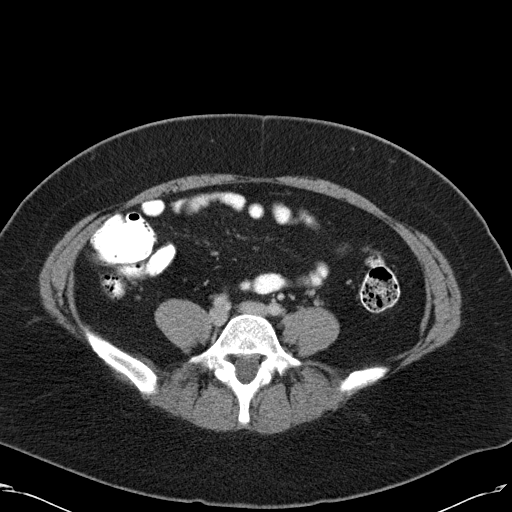
[im 47/91  soft-tissue]
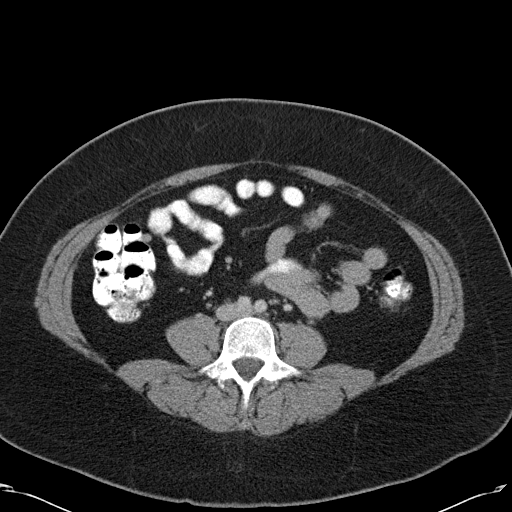
[im 51/91  soft-tissue]
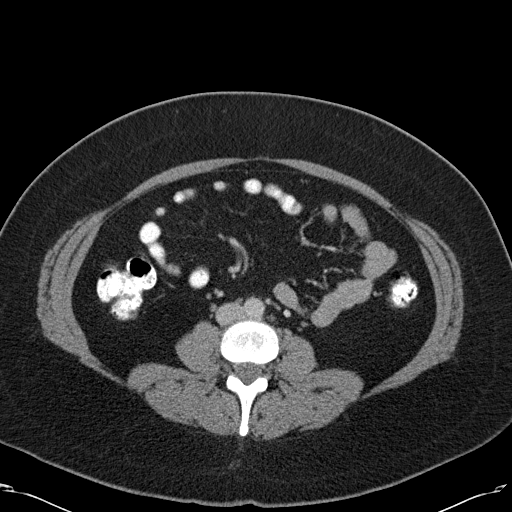
[im 58/91  soft-tissue]
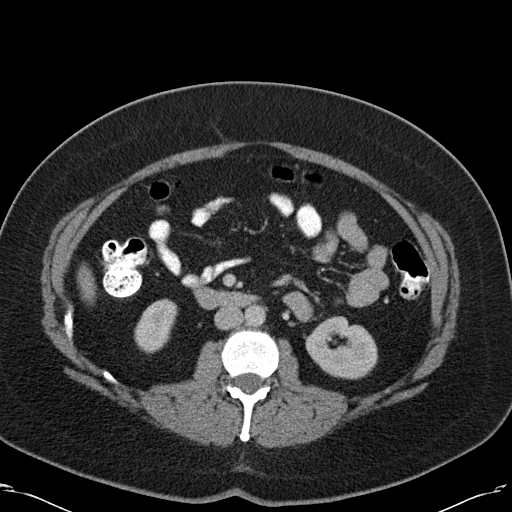
[im 58/91  bone]
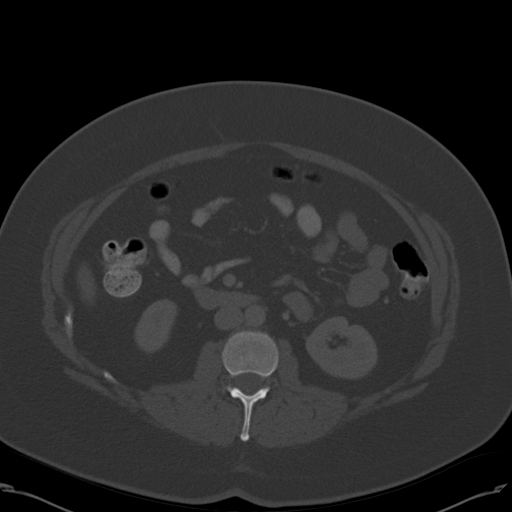
[im 65/91  soft-tissue]
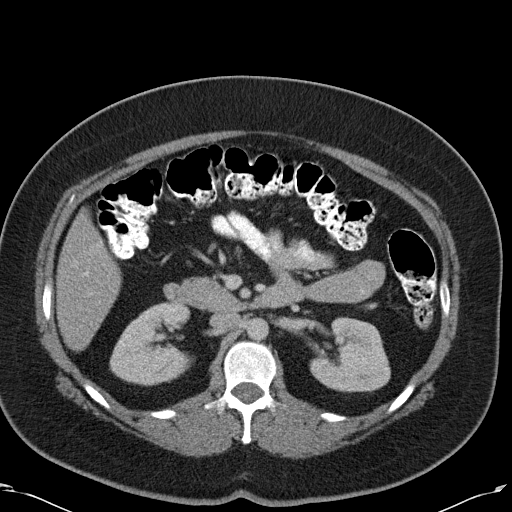
[im 73/91  soft-tissue]
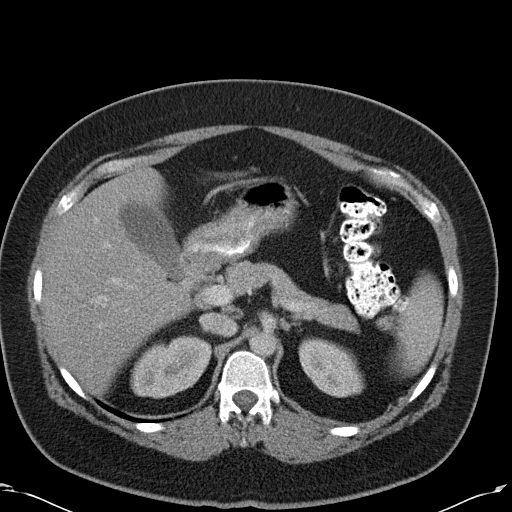
[im 80/91  soft-tissue]
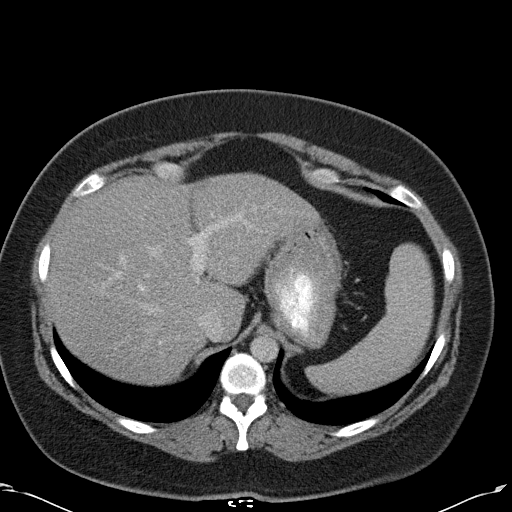
[im 87/91  soft-tissue]
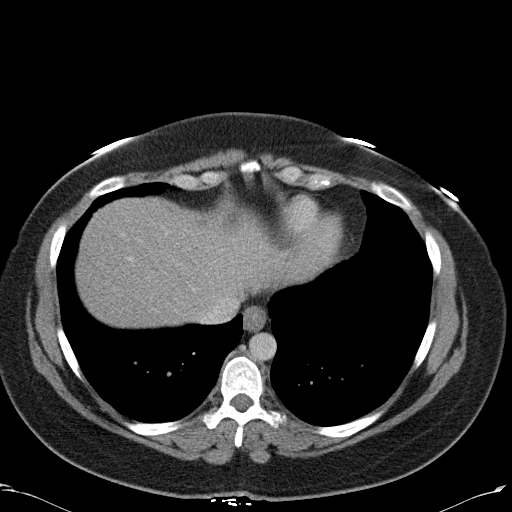

[Series 5: abd/pelv with 2.0 spo cor st · coronal · 0.70mm/px · 3 of 125 slices shown]
[im 42/125  soft-tissue]
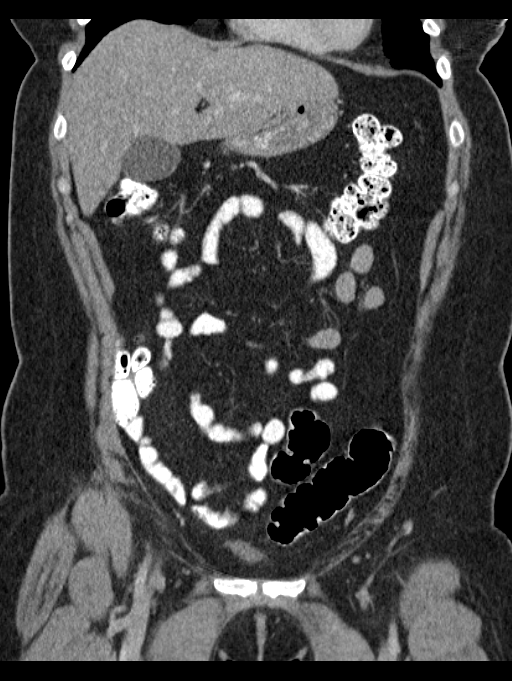
[im 56/125  soft-tissue]
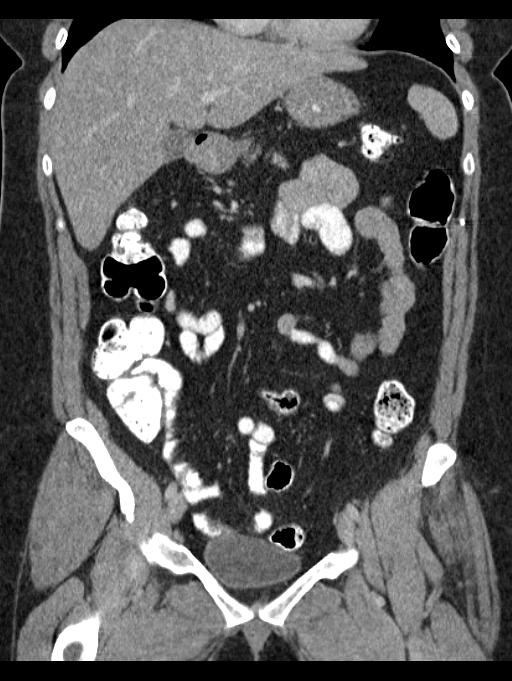
[im 69/125  soft-tissue]
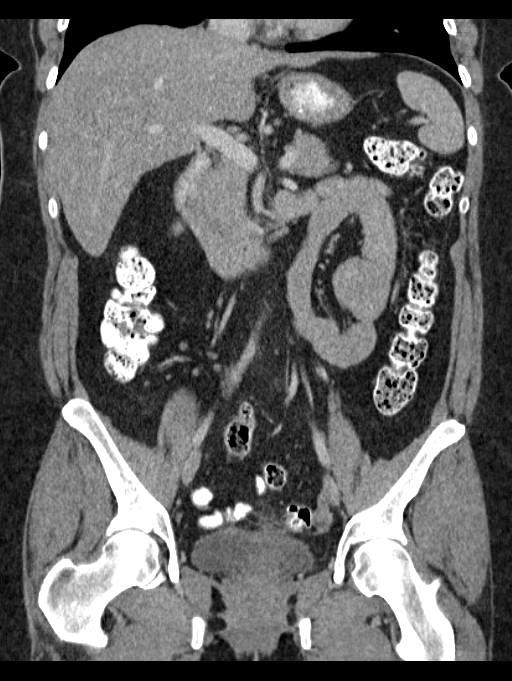

[16 of 46 positions shown; findings below may reference images not displayed]

FINDINGS: Clear lung bases. Normal heart size without pericardial
or pleural effusion.  A tiny hiatal hernia.  The superior-most 5 mm
of the hepatic dome are excluded.  Mild hepatic steatosis is
suspected.  No focal liver lesion.

Normal spleen, distal stomach, pancreas, gallbladder, biliary
tract, adrenal glands, kidneys. No retroperitoneal or retrocrural
adenopathy.

Normal colon, appendix, and terminal ileum.
Normal small bowel without abdominal ascites.

  No pelvic adenopathy.  There is no urinary bladder wall
thickening.  There is subtle edema identified between the bladder
and uterus.  Example transverse image 70 and sagittal image 81.
This is separate from the sigmoid colon.

A exophytic fibroid is suspected, measuring 2.1 cm off the uterine
fundus.  The uterus is retroverted.

The ovaries are within normal limits. No significant free fluid.

No acute osseous abnormality.
IMPRESSION: 1.  Subtle edema adjacent the urinary bladder and uterus.  Given
the clinical history, favored to be related to cystitis.
2.  Suspect a small uterine fundal fibroid, exophytic/subserosal.
3.  No other acute process or explanation for pain.
4.  Mild hepatic steatosis.

Findings were discussed with the patient, prior to this dictation,
at [DATE] a.m.

## 2011-05-15 MED ORDER — ONETOUCH DELICA LANCETS MISC: 1.0000 "application " | Freq: Two times a day (BID) | Status: DC

## 2011-05-15 MED ORDER — IOHEXOL 300 MG/ML  SOLN
100.0000 mL | Freq: Once | INTRAMUSCULAR | Status: AC | PRN
  Administered 2011-05-15: 100 mL via INTRAVENOUS

## 2011-05-15 MED ORDER — GLUCOSE BLOOD VI STRP: ORAL_STRIP | Status: AC

## 2011-05-15 NOTE — Telephone Encounter (Signed)
Faxed.   KP 

## 2011-05-16 LAB — URINE CULTURE

## 2011-05-21 ENCOUNTER — Telehealth: Payer: Self-pay | Admitting: Family Medicine

## 2011-05-23 NOTE — Telephone Encounter (Signed)
Discussed with patient    Cassidy Gibson 

## 2011-08-06 ENCOUNTER — Encounter: Payer: 59 | Attending: Family Medicine | Admitting: *Deleted

## 2011-08-06 ENCOUNTER — Encounter: Payer: Self-pay | Admitting: *Deleted

## 2011-08-06 DIAGNOSIS — R7309 Other abnormal glucose: Secondary | ICD-10-CM | POA: Insufficient documentation

## 2011-08-06 DIAGNOSIS — Z713 Dietary counseling and surveillance: Secondary | ICD-10-CM | POA: Insufficient documentation

## 2011-08-06 NOTE — Progress Notes (Signed)
Medical Nutrition Therapy:  Appt start time: 0830 end time:  0930.  Assessment:  Prediabetes.  Pt with recent A1c of 6.3% and strong FMH of T2DM here for prediabetes assessment. Checks BG 1x/wk per [facility] and reports FBGs of 120-133 mg/dL.  Recent results: 11/01: 119 11/7: 109 11/14: 119 11/21: 120 11/28: 133 12/1: 113   Has not started exercise yet, but plans to. Discussed how it helps regulate BG.  Pt works for [employer] team at [employer] and under high stress recently.  Eats excessive CHO from fast food daily. Has goal weight of 140-150 lbs; first goal: 175 lbs.   MEDICATIONS: See med list; reconciled with patient.   DIETARY INTAKE:  Usual eating pattern includes 3 meals and 0 snacks per day.   24-hr recall:  B ( AM): [employer] ham biscuit, diet coke or water  Snk ( AM): none  L ( PM): [employer] ceasar salad w/ sweet potato biscuits or [employer] sandwich and waffle fries, diet coke or water Snk ( PM): none D ( PM): Hot dog w/ bun, homemade chips, ranch dressing OR cheeseburger, fries; diet coke or water  Snk ( PM): none  Usual physical activity: None  Estimated energy needs: 1400 calories 155-160 g carbohydrates 105 g protein 40 g fat  Progress Towards Goal(s):  In progress.   Nutritional Diagnosis:  [place]-2.2 Altered nutrition-related laboratory related to glucose metabolism as evidenced by recent A1c of 6.3% and MD referral for diabetic education.    Intervention/Goals:  Follow Pre-Diabetes Meal Plan as instructed (yellow card).  Add lean protein foods to all meals/snacks.  Aim for <25 grams of sugar daily; <10 grams per serving.  Decrease meals away from home; look at nutrition information for restaurants to make healthy, low-carb  choices.  Aim for 150 mins of physical activity weekly.  Bring food record and glucose log to your next nutrition visit.  Monitoring/Evaluation:  Dietary intake, exercise, FBG, and body weight in 5 week(s).

## 2011-08-06 NOTE — Patient Instructions (Addendum)
Goals:  Follow Pre-Diabetes Meal Plan as instructed (yellow card).  Add lean protein foods to all meals/snacks.  Aim for <25 grams of sugar daily; <10 grams per serving.  Decrease meals away from home; look at nutrition information for restaurants to make healthy, low-carb  choices.  Aim for 150 mins of physical activity weekly.  Bring food record and glucose log to your next nutrition visit.

## 2011-09-10 ENCOUNTER — Encounter: Payer: 59 | Attending: Family Medicine | Admitting: *Deleted

## 2011-09-10 ENCOUNTER — Encounter: Payer: Self-pay | Admitting: *Deleted

## 2011-09-10 DIAGNOSIS — R7309 Other abnormal glucose: Secondary | ICD-10-CM | POA: Insufficient documentation

## 2011-09-10 DIAGNOSIS — Z713 Dietary counseling and surveillance: Secondary | ICD-10-CM | POA: Insufficient documentation

## 2011-09-10 NOTE — Patient Instructions (Signed)
Goals:  Continue to follow Pre-Diabetes meal plan as instructed (yellow card).  Add lean protein foods to all meals/snacks.  Try boxing up 1/2 your meal at restaurants.  Aim for 150 mins of physical activity weekly when cleared by MD.

## 2011-09-10 NOTE — Progress Notes (Signed)
Medical Nutrition Therapy:  Appt start time: 0830 end time:  0930.  Primary Concerns Today: Prediabetes, F/U.  Pt here for f/u. Still checks BG 1x/wk and reports FBGs of 108-113 mg/dL. Reports flare of plantar fascitis, but no pain reported. Taking naproxen BID and icing daily. Cannot exercise for 2 weeks.  Doing fast w/ church; cut out red meat and eating only chicken or fish. No fried foods; eating more whole grains, wild rice; No diet sodas, only water with [place] or diet green tea. Since the beginning of Jan, has been cooking at home and rarely eats out. Sunday nights usually has food out. More conscious of food labels and watching CHO/sugar.  Eating lots of almonds (cinnamon-sugar).   MEDICATIONS: See med list; reconciled with patient.   DIETARY INTAKE:  Usual eating pattern includes 3 meals and 0 snacks per day.   Usual physical activity:  None d/t MD order.   Estimated energy needs: 1400 calories 155-160 g carbohydrates 105 g protein 40 g fat  Progress Towards Goal(s):  In progress.   Nutritional Diagnosis:  [place]-2.2 Altered nutrition-related laboratory related to glucose metabolism as evidenced by recent A1c of 6.3% and MD referral for diabetic education.    Intervention/Goals:  Follow Pre-Diabetes Meal Plan as instructed (yellow card).  Add lean protein foods to all meals/snacks.  Aim for <25 grams of sugar daily; <10 grams per serving.  Decrease meals away from home; look at nutrition information for restaurants to make healthy, low-carb  choices.  Aim for 150 mins of physical activity weekly.  Bring food record and glucose log to your next nutrition visit.  Monitoring/Evaluation:  Dietary intake, exercise, FBG, and body weight in 5 week(s).

## 2011-10-22 ENCOUNTER — Ambulatory Visit: Payer: 59 | Admitting: *Deleted

## 2011-11-02 ENCOUNTER — Encounter: Payer: 59 | Attending: Family Medicine | Admitting: *Deleted

## 2011-11-02 ENCOUNTER — Encounter: Payer: Self-pay | Admitting: *Deleted

## 2011-11-02 DIAGNOSIS — R7309 Other abnormal glucose: Secondary | ICD-10-CM | POA: Insufficient documentation

## 2011-11-02 DIAGNOSIS — Z713 Dietary counseling and surveillance: Secondary | ICD-10-CM | POA: Insufficient documentation

## 2011-11-02 NOTE — Patient Instructions (Addendum)
Goals:  Continue to follow Pre-Diabetes meal plan as instructed (yellow card).  Add lean protein foods to all meals/snacks (especially "fast" sugars like fruit).  Aim for 150 mins of physical activity weekly.   Keep sugar and fat in single digits per serving.   Have new MD check A1c at visit next month.

## 2011-11-02 NOTE — Progress Notes (Signed)
Medical Nutrition Therapy:  Appt start time: 0830 end time:  0930.  Primary Concerns Today: Prediabetes, F/U.  Pt reports being sick with allergies for last week. Has felt like she had the flu.  Has gotten cleared to exercise, but unable to d/t illness. Curious about how much fruit she can have.  Reports FBG of 101-117 mg since January '13, down from 130s. Continues to check every Wednesday. Reports counting CHO at all meals now. Recent dinner includes  Pork chop, broccoli, and 1 cup brown rice. Also eating shrimp, 1/2 6" sub, and choosing healthier snacks. Has decreased meals away from home and eating smaller portions at meals.   TANITA  BODY COMP RESULTS  09/20/11 11/02/11    %Fat  46.6%    FM (lbs)  91.5    FFM (lbs)  105.0    TBW (lbs)  77.0     MEDICATIONS: See med list; reconciled with patient.   DIETARY INTAKE:  Usual eating pattern includes 3 meals and 0 snacks per day.   Usual physical activity:  None d/t illness. Pt is cleared by MD now.   Estimated energy needs: 1400 calories 155-160 g carbohydrates 105 g protein 40 g fat  Progress Towards Goal(s):  In progress.   Nutritional Diagnosis:  [place]-2.2 Altered nutrition-related laboratory related to glucose metabolism as evidenced by recent A1c of 6.3% and MD referral for diabetic education.    Intervention/Goals:  Nutrition education.  Monitoring/Evaluation:  Dietary intake, exercise, FBG, and body weight in 3 months.

## 2011-12-03 ENCOUNTER — Encounter: Payer: Self-pay | Admitting: Family Medicine

## 2011-12-03 ENCOUNTER — Ambulatory Visit (INDEPENDENT_AMBULATORY_CARE_PROVIDER_SITE_OTHER): Payer: 59 | Admitting: Family Medicine

## 2011-12-03 VITALS — BP 114/80 | HR 68 | Temp 97.8°F | Ht 62.0 in | Wt 201.0 lb

## 2011-12-03 DIAGNOSIS — Z0000 Encounter for general adult medical examination without abnormal findings: Secondary | ICD-10-CM | POA: Insufficient documentation

## 2011-12-03 DIAGNOSIS — Z136 Encounter for screening for cardiovascular disorders: Secondary | ICD-10-CM

## 2011-12-03 DIAGNOSIS — R7303 Prediabetes: Secondary | ICD-10-CM

## 2011-12-03 DIAGNOSIS — R7309 Other abnormal glucose: Secondary | ICD-10-CM

## 2011-12-03 DIAGNOSIS — E1110 Type 2 diabetes mellitus with ketoacidosis without coma: Secondary | ICD-10-CM | POA: Insufficient documentation

## 2011-12-03 LAB — COMPREHENSIVE METABOLIC PANEL
AST: 36 U/L (ref 0–37)
Albumin: 3.9 g/dL (ref 3.5–5.2)
Calcium: 9 mg/dL (ref 8.4–10.5)
Creatinine, Ser: 0.7 mg/dL (ref 0.4–1.2)
GFR: 103.62 mL/min (ref >60.00)
Glucose, Bld: 120 mg/dL — ABNORMAL HIGH (ref 70–99)
Sodium: 138 mEq/L (ref 135–145)
Total Bilirubin: 0.3 mg/dL (ref 0.3–1.2)

## 2011-12-03 LAB — LIPID PANEL
HDL: 56.3 mg/dL (ref >39.00)
Triglycerides: 75 mg/dL (ref 0.0–149.0)
VLDL: 15 mg/dL (ref 0.0–40.0)

## 2011-12-03 NOTE — Patient Instructions (Signed)
It was so nice to meet you. We will call you with your lab results this week. Have a wonderful week and enjoy the warm weather.

## 2011-12-03 NOTE — Progress Notes (Signed)
Subjective:    Patient ID: Cassidy Gibson, female    DOB: [birth year], 41 y.o.   MRN: [id number]  HPI  41 yo here to establish care.  Prediabetes- Lab Results  Component Value Date   HGBA1C 6.3 05/14/2011   Has been attending diabetes nutrition, feels she has changed her diet and doing well. Strong FH of Diabetes.  Well woman-G1P1- see Dr. Renaldo Fiddler, due for CPX this June. No h/o abnormal pap smears.  Patient Active Problem List  Diagnoses  . ALLERGIC RHINITIS  . SINUSITIS - ACUTE-NOS  . Prediabetes  . Routine general medical examination at a health care facility   Past Medical History  Diagnosis Date  . Allergy   . Prediabetes    Past Surgical History  Procedure Date  . Cesarean section 2001  . Adenoidectomy 1978  . Breast reduction surgery 1998  . Tympanostomy tube placement 1979   History  Substance Use Topics  . Smoking status: Never Smoker   . Smokeless tobacco: Never Used  . Alcohol Use: No   Family History  Problem Relation Age of Onset  . Breast cancer Mother   . GER disease Mother   . Cancer Mother     Breast  . Diabetes Father   . Hypertension Father   . Hyperlipidemia Brother   . Hypertension Brother   . Diabetes Maternal Aunt   . Diabetes Maternal Uncle   . Diabetes Paternal Aunt   . Diabetes Paternal Uncle   . Diabetes Maternal Grandmother   . Diabetes Paternal Grandmother   . Heart disease Paternal Grandfather    No Known Allergies Current Outpatient Prescriptions on File Prior to Visit  Medication Sig Dispense Refill  . fluticasone (VERAMYST) 27.5 MCG/SPRAY nasal spray Place 2 sprays into the nose daily as needed.        Marland Kitchen glucose blood test strip Check glucose twice a day  100 each  12  . ibuprofen (ADVIL,MOTRIN) 800 MG tablet Take 800 mg by mouth every 8 (eight) hours as needed.        Marland Kitchen levocetirizine (XYZAL) 5 MG tablet TAKE 1 TABLET BY MOUTH DAILY AS NEEDED  30 tablet  5  . levonorgestrel-ethinyl estradiol (LEVORA 0.15/30, 28,)  0.15-30 MG-MCG per tablet Take 1 tablet by mouth daily.        . naproxen (NAPROSYN) 500 MG tablet Take 500 mg by mouth 2 (two) times daily with a meal.      . ONETOUCH DELICA LANCETS MISC 1 application by Does not apply route 2 (two) times daily.  100 each  2   The PMH, PSH, Social History, Family History, Medications, and allergies have been reviewed in [facility], and have been updated if relevant.   Review of Systems    See HPI Patient reports no  vision/ hearing changes,anorexia, weight change, fever ,adenopathy, persistant / recurrent hoarseness, swallowing issues, chest pain, edema,persistant / recurrent cough, hemoptysis, dyspnea(rest, exertional, paroxysmal nocturnal), gastrointestinal  bleeding (melena, rectal bleeding), abdominal pain, excessive heart burn, GU symptoms(dysuria, hematuria, pyuria, voiding/incontinence  Issues) syncope, focal weakness, severe memory loss, concerning skin lesions, depression, anxiety, abnormal bruising/bleeding, major joint swelling, breast masses or abnormal vaginal bleeding.    Objective:   Physical Exam BP 114/80  Pulse 68  Temp(Src) 97.8 F (36.6 C) (Oral)  Ht 5\' 2"  (1.575 m)  Wt 201 lb (91.173 kg)  BMI 36.76 kg/m2  General:  Well-developed,well-nourished,in no acute distress; alert,appropriate and cooperative throughout examination Head:  normocephalic and atraumatic.   Eyes:  vision grossly intact, pupils equal, pupils round, and pupils reactive to light.   Ears:  R ear normal and L ear normal.   Nose:  no external deformity.   Mouth:  good dentition.   Lungs:  Normal respiratory effort, chest expands symmetrically. Lungs are clear to auscultation, no crackles or wheezes. Heart:  Normal rate and regular rhythm. S1 and S2 normal without gallop, murmur, click, rub or other extra sounds. Abdomen:  Bowel sounds positive,abdomen soft and non-tender without masses, organomegaly or hernias noted. Msk:  No deformity or scoliosis noted of thoracic or  lumbar spine.   Extremities:  No clubbing, cyanosis, edema, or deformity noted with normal full range of motion of all joints.   Neurologic:  alert & oriented X3 and gait normal.   Skin:  Intact without suspicious lesions or rashes Psych:  Cognition and judgment appear intact. Alert and cooperative with normal attention span and concentration. No apparent delusions, illusions, hallucinations     Assessment & Plan:   1. Prediabetes  Diet controlled. Recheck a1c today. Hemoglobin A1c  2. Screening for ischemic heart disease  Lipid Panel  3. Routine general medical examination at a health care facility  Reviewed preventive care protocols, scheduled due services, and updated immunizations Discussed nutrition, exercise, diet, and healthy lifestyle.  Comprehensive metabolic panel

## 2011-12-04 ENCOUNTER — Ambulatory Visit: Payer: 59 | Admitting: *Deleted

## 2011-12-21 ENCOUNTER — Ambulatory Visit: Payer: 59 | Admitting: Family Medicine

## 2012-01-15 ENCOUNTER — Encounter: Payer: 59 | Attending: Family Medicine | Admitting: *Deleted

## 2012-01-15 ENCOUNTER — Encounter: Payer: Self-pay | Admitting: *Deleted

## 2012-01-15 VITALS — Ht 62.0 in | Wt 201.5 lb

## 2012-01-15 DIAGNOSIS — R7303 Prediabetes: Secondary | ICD-10-CM

## 2012-01-15 DIAGNOSIS — R7309 Other abnormal glucose: Secondary | ICD-10-CM | POA: Insufficient documentation

## 2012-01-15 DIAGNOSIS — Z713 Dietary counseling and surveillance: Secondary | ICD-10-CM | POA: Insufficient documentation

## 2012-01-15 NOTE — Patient Instructions (Signed)
Goals:  Continue to follow Pre-Diabetes meal plan as instructed (yellow card).  Add lean protein foods to all meals/snacks (especially "fast" sugars like fruit).  Aim for 150 mins of physical activity weekly.   Keep sugar and fat in single digits per serving.

## 2012-01-15 NOTE — Progress Notes (Signed)
Medical Nutrition Therapy:  Appt start time: 0830 end time:  0930.  Primary Concerns Today: Prediabetes, F/U.  Wt goal of 160 lbs will put her at ~30% body fat. Reports new MD with A1c 6.3%, FBG around 113 mg. Continues better choices and counting CHO at meals. Has decreased fast food and now gets salads with grilled chicken since the beginning of May; reports eating french fries only one time a week.  Drinks diet green tea and less diet drinks. Candace Gallus  BODY COMP RESULTS  09/20/11 11/02/11 01/15/12  %Fat  46.6% 45.4%  FM (lbs)  91.5 91.5  FFM (lbs)  105.0 110.0  TBW (lbs)  77.0 80.5   MEDICATIONS: See med list; reconciled with patient.   DIETARY INTAKE:  Usual eating pattern includes 3 meals and 0 snacks per day.   Usual physical activity:  New project at work has her walking everywhere   Estimated energy needs: 1400 calories 155-160 g carbohydrates 105 g protein 40 g fat  Progress Towards Goal(s):  In progress.   Nutritional Diagnosis:  [place]-2.2 Altered nutrition-related laboratory related to glucose metabolism as evidenced by recent A1c of 6.3% and MD referral for diabetic education.    Intervention/Goals:  Nutrition education.  Monitoring/Evaluation:  Dietary intake, exercise, FBG, and body weight in 3 months.

## 2012-02-01 ENCOUNTER — Ambulatory Visit (INDEPENDENT_AMBULATORY_CARE_PROVIDER_SITE_OTHER): Payer: 59 | Admitting: Family Medicine

## 2012-02-01 ENCOUNTER — Encounter: Payer: Self-pay | Admitting: Family Medicine

## 2012-02-01 VITALS — BP 110/80 | HR 88 | Temp 98.2°F | Wt 202.0 lb

## 2012-02-01 DIAGNOSIS — J019 Acute sinusitis, unspecified: Secondary | ICD-10-CM

## 2012-02-01 MED ORDER — AMOXICILLIN-POT CLAVULANATE 875-125 MG PO TABS: 1.0000 | ORAL_TABLET | Freq: Two times a day (BID) | ORAL | Status: AC

## 2012-02-01 NOTE — Progress Notes (Signed)
SUBJECTIVE:  Cassidy Gibson is a 41 y.o. female who complains of coryza, congestion, sneezing, sore throat, dry cough, productive cough and bilateral sinus pain for 7 days. She denies a history of anorexia, chest pain and chills and denies a history of asthma. Patient denies smoke cigarettes.   Patient Active Problem List  Diagnoses  . ALLERGIC RHINITIS  . SINUSITIS - ACUTE-NOS  . Prediabetes  . Routine general medical examination at a health care facility   Past Medical History  Diagnosis Date  . Allergy   . Prediabetes    Past Surgical History  Procedure Date  . Cesarean section 2001  . Adenoidectomy 1978  . Breast reduction surgery 1998  . Tympanostomy tube placement 1979   History  Substance Use Topics  . Smoking status: Never Smoker   . Smokeless tobacco: Never Used  . Alcohol Use: No   Family History  Problem Relation Age of Onset  . Breast cancer Mother   . GER disease Mother   . Cancer Mother     Breast  . Diabetes Father   . Hypertension Father   . Hyperlipidemia Brother   . Hypertension Brother   . Diabetes Maternal Aunt   . Diabetes Maternal Uncle   . Diabetes Paternal Aunt   . Diabetes Paternal Uncle   . Diabetes Maternal Grandmother   . Diabetes Paternal Grandmother   . Heart disease Paternal Grandfather    No Known Allergies Current Outpatient Prescriptions on File Prior to Visit  Medication Sig Dispense Refill  . fluticasone (VERAMYST) 27.5 MCG/SPRAY nasal spray Place 2 sprays into the nose daily as needed.        Marland Kitchen glucose blood test strip Check glucose twice a day  100 each  12  . ibuprofen (ADVIL,MOTRIN) 800 MG tablet Take 800 mg by mouth every 8 (eight) hours as needed.        Marland Kitchen levocetirizine (XYZAL) 5 MG tablet TAKE 1 TABLET BY MOUTH DAILY AS NEEDED  30 tablet  5  . levonorgestrel-ethinyl estradiol (LEVORA 0.15/30, 28,) 0.15-30 MG-MCG per tablet Take 1 tablet by mouth daily.        Letta Pate DELICA LANCETS MISC 1 application by Does not  apply route 2 (two) times daily.  100 each  2   The PMH, PSH, Social History, Family History, Medications, and allergies have been reviewed in [facility], and have been updated if relevant.  OBJECTIVE: BP 110/80  Pulse 88  Temp 98.2 F (36.8 C)  Wt 202 lb (91.627 kg)  She appears well, vital signs are as noted. Ears normal.  Throat and pharynx normal.  Neck supple. No adenopathy in the neck. Nose is congested. Sinuses non tender. The chest is clear, without wheezes or rales.  ASSESSMENT:  sinusitis  PLAN: Given duration and progression of symptoms, will treat for bacterial sinusitis. Symptomatic therapy suggested: push fluids, rest and return office visit prn if symptoms persist or worsen.  Call or return to clinic prn if these symptoms worsen or fail to improve as anticipated.

## 2012-05-26 ENCOUNTER — Other Ambulatory Visit: Payer: Self-pay | Admitting: Family Medicine

## 2012-06-13 ENCOUNTER — Encounter: Payer: Self-pay | Admitting: Family Medicine

## 2012-06-13 ENCOUNTER — Ambulatory Visit (INDEPENDENT_AMBULATORY_CARE_PROVIDER_SITE_OTHER): Payer: 59 | Admitting: Family Medicine

## 2012-06-13 VITALS — BP 110/70 | HR 84 | Temp 98.2°F | Wt 196.0 lb

## 2012-06-13 DIAGNOSIS — J069 Acute upper respiratory infection, unspecified: Secondary | ICD-10-CM

## 2012-06-13 DIAGNOSIS — J309 Allergic rhinitis, unspecified: Secondary | ICD-10-CM

## 2012-06-13 NOTE — Assessment & Plan Note (Signed)
Stop Xyzal and try allegra given not effective.

## 2012-06-13 NOTE — Assessment & Plan Note (Signed)
Symptomatic care. See pt instructions.

## 2012-06-13 NOTE — Patient Instructions (Addendum)
Can change xyzal to allegra and continue veramyst. Start mucinex 600 mg twice daily and nasal saline irrigation. Call if not improving in 7-10 days.

## 2012-06-13 NOTE — Progress Notes (Signed)
  Subjective:    Patient ID: Cassidy Gibson, female    DOB: 12-21-1970, 41 y.o.   MRN: [id number]  HPI 41 year old female pt of Dr. Elmer Sow presents with  2 days of post nassal drip, head congestion, sore throat.  Head feels full. Green nasal discharge.  Cough and sneeze.  Cough dry, keeping her up at night. No fever. No SOB, no wheeze. Some chest heaviness.  Has used Advil Cold.Marland Kitchen Helped minimally.  Has history of allergies on veramyst and Xyzal.  Just got back 3 weeks ago from [place], she feels like her nose has not been right since.    Review of Systems  Constitutional: Positive for fatigue. Negative for fever.  HENT: Negative for ear pain.   Eyes: Negative for pain.  Respiratory: Negative for chest tightness and shortness of breath.   Cardiovascular: Negative for chest pain, palpitations and leg swelling.  Gastrointestinal: Negative for abdominal pain.  Genitourinary: Negative for dysuria.       Objective:   Physical Exam  Constitutional: Vital signs are normal. She appears well-developed and well-nourished. She is cooperative.  Non-toxic appearance. She does not appear ill. No distress.  HENT:  Head: Normocephalic.  Right Ear: Hearing, tympanic membrane, external ear and ear canal normal. Tympanic membrane is not erythematous, not retracted and not bulging.  Left Ear: Hearing, tympanic membrane, external ear and ear canal normal. Tympanic membrane is not erythematous, not retracted and not bulging.  Nose: Mucosal edema and rhinorrhea present. Right sinus exhibits no maxillary sinus tenderness and no frontal sinus tenderness. Left sinus exhibits no maxillary sinus tenderness and no frontal sinus tenderness.  Mouth/Throat: Uvula is midline, oropharynx is clear and moist and mucous membranes are normal.  Eyes: Conjunctivae normal, EOM and lids are normal. Pupils are equal, round, and reactive to light. No foreign bodies found.  Neck: Trachea normal and normal range of motion.  Neck supple. Carotid bruit is not present. No mass and no thyromegaly present.  Cardiovascular: Normal rate, regular rhythm, S1 normal, S2 normal, normal heart sounds, intact distal pulses and normal pulses.  Exam reveals no gallop and no friction rub.   No murmur heard. Pulmonary/Chest: Effort normal and breath sounds normal. Not tachypneic. No respiratory distress. She has no decreased breath sounds. She has no wheezes. She has no rhonchi. She has no rales.  Neurological: She is alert.  Skin: Skin is warm, dry and intact. No rash noted.  Psychiatric: Her speech is normal and behavior is normal. Judgment normal. Her mood appears not anxious. Cognition and memory are normal. She does not exhibit a depressed mood.          Assessment & Plan:

## 2013-01-06 ENCOUNTER — Encounter: Payer: Self-pay | Admitting: Family Medicine

## 2013-01-06 ENCOUNTER — Ambulatory Visit (INDEPENDENT_AMBULATORY_CARE_PROVIDER_SITE_OTHER): Payer: 59 | Admitting: Family Medicine

## 2013-01-06 VITALS — BP 110/72 | HR 72 | Temp 98.1°F | Wt 197.0 lb

## 2013-01-06 DIAGNOSIS — R7303 Prediabetes: Secondary | ICD-10-CM

## 2013-01-06 DIAGNOSIS — R7309 Other abnormal glucose: Secondary | ICD-10-CM

## 2013-01-06 DIAGNOSIS — L290 Pruritus ani: Secondary | ICD-10-CM

## 2013-01-06 LAB — HEMOGLOBIN A1C: Hgb A1c MFr Bld: 6.5 % (ref 4.6–6.5)

## 2013-01-06 NOTE — Patient Instructions (Addendum)
Good to see you. I will call you with your lab and stool culture results.

## 2013-01-06 NOTE — Addendum Note (Signed)
Addended by: Alvina Chou on: 01/06/2013 10:23 AM   Modules accepted: Orders

## 2013-01-06 NOTE — Progress Notes (Signed)
Subjective:    Patient ID: Cassidy Gibson, female    DOB: 05/19/1971, 42 y.o.   MRN: [id number]  HPI  Very pleasant 42 yo female here with complaint of anal itching x 2 months.  No changes in her bowel habits, no rectal pain or pain with defecation.  No h/o hemorrhoid. She had one minor episode of bleeding but she thought it was due to a pimple that has resolved.  She does travel out of country frequently, returned from her last mission trip to [place] 6 months ago.  No one in her household has had similar symptoms.  Patient Active Problem List   Diagnosis Date Noted  . Anal itching 01/06/2013  . Prediabetes 12/03/2011  . ALLERGIC RHINITIS 07/11/2010   Past Medical History  Diagnosis Date  . Allergy   . Prediabetes    Past Surgical History  Procedure Laterality Date  . Cesarean section  2001  . Adenoidectomy  1978  . Breast reduction surgery  1998  . Tympanostomy tube placement  1979   History  Substance Use Topics  . Smoking status: Never Smoker   . Smokeless tobacco: Never Used  . Alcohol Use: No   Family History  Problem Relation Age of Onset  . Breast cancer Mother   . GER disease Mother   . Cancer Mother     Breast  . Diabetes Father   . Hypertension Father   . Hyperlipidemia Brother   . Hypertension Brother   . Diabetes Maternal Aunt   . Diabetes Maternal Uncle   . Diabetes Paternal Aunt   . Diabetes Paternal Uncle   . Diabetes Maternal Grandmother   . Diabetes Paternal Grandmother   . Heart disease Paternal Grandfather    No Known Allergies Current Outpatient Prescriptions on File Prior to Visit  Medication Sig Dispense Refill  . ibuprofen (ADVIL,MOTRIN) 800 MG tablet Take 800 mg by mouth every 8 (eight) hours as needed.        Marland Kitchen levonorgestrel-ethinyl estradiol (LEVORA 0.15/30, 28,) 0.15-30 MG-MCG per tablet Take 1 tablet by mouth daily.        Letta Pate DELICA LANCETS MISC 1 application by Does not apply route 2 (two) times daily.  100 each  2    No current facility-administered medications on file prior to visit.   The PMH, PSH, Social History, Family History, Medications, and allergies have been reviewed in [facility], and have been updated if relevant.   Review of Systems    See HPI No abdominal pain Objective:   Physical Exam BP 110/72  Pulse 72  Temp(Src) 98.1 F (36.7 C)  Wt 197 lb (89.359 kg)  BMI 36.02 kg/m2  General:  Well-developed,well-nourished,in no acute distress; alert,appropriate and cooperative throughout examination Head:  normocephalic and atraumatic.   Abdomen:  Bowel sounds positive,abdomen soft and non-tender without masses, organomegaly or hernias noted. Rectal:  no external abnormalities.   Msk:  No deformity or scoliosis noted of thoracic or lumbar spine.   Psych:  Cognition and judgment appear intact. Alert and cooperative with normal attention span and concentration. No apparent delusions, illusions, hallucinations     Assessment & Plan:  1. Anal itching No abnormalities on exam- no hemorrhoids or fissures. ?pinworm vs parasite given h/o travel. Will check stool cx.  She will take OTC pinworm tx. Check a1c as increased CBGs can cause anal itching. The patient indicates understanding of these issues and agrees with the plan.  - Giardia, EIA; Ova/Parasite  2. Prediabetes  -  Hemoglobin A1c

## 2013-01-08 LAB — OVA AND PARASITE EXAMINATION: OP: NONE SEEN

## 2013-01-12 ENCOUNTER — Ambulatory Visit (INDEPENDENT_AMBULATORY_CARE_PROVIDER_SITE_OTHER): Payer: 59 | Admitting: Family Medicine

## 2013-01-12 ENCOUNTER — Encounter: Payer: Self-pay | Admitting: Family Medicine

## 2013-01-12 VITALS — BP 118/80 | HR 76 | Temp 98.1°F | Wt 196.0 lb

## 2013-01-12 DIAGNOSIS — E119 Type 2 diabetes mellitus without complications: Secondary | ICD-10-CM

## 2013-01-12 MED ORDER — METFORMIN HCL 500 MG PO TABS: 500.0000 mg | ORAL_TABLET | Freq: Every day | ORAL | Status: DC

## 2013-01-12 NOTE — Progress Notes (Signed)
Subjective:    Patient ID: Cassidy Gibson, female    DOB: 04-18-1971, 42 y.o.   MRN: [id number]  HPI  Very pleasant 42 yo female here to discuss new onset diabetes.  Has been prediabetic for years (on and off), managed by diet.  a1c last week was 6.5, which does now classify her as a diabetic.  She does have a family history of diabetes.  Since we called her to schedule this appointment, she has already been working on cutting back on carbs and walking. In 2007, went through diabetic teaching and she has meter at home.  She also "knows what to do."  Checked her fasting CBG this am was 122. Lab Results  Component Value Date   HGBA1C 6.5 01/06/2013    Denies any increased thirst or urination.    Patient Active Problem List   Diagnosis Date Noted  . Newly diagnosed diabetes 12/03/2011  . ALLERGIC RHINITIS 07/11/2010   Past Medical History  Diagnosis Date  . Allergy   . Prediabetes    Past Surgical History  Procedure Laterality Date  . Cesarean section  2001  . Adenoidectomy  1978  . Breast reduction surgery  1998  . Tympanostomy tube placement  1979   History  Substance Use Topics  . Smoking status: Never Smoker   . Smokeless tobacco: Never Used  . Alcohol Use: No   Family History  Problem Relation Age of Onset  . Breast cancer Mother   . GER disease Mother   . Cancer Mother     Breast  . Diabetes Father   . Hypertension Father   . Hyperlipidemia Brother   . Hypertension Brother   . Diabetes Maternal Aunt   . Diabetes Maternal Uncle   . Diabetes Paternal Aunt   . Diabetes Paternal Uncle   . Diabetes Maternal Grandmother   . Diabetes Paternal Grandmother   . Heart disease Paternal Grandfather    No Known Allergies Current Outpatient Prescriptions on File Prior to Visit  Medication Sig Dispense Refill  . ibuprofen (ADVIL,MOTRIN) 800 MG tablet Take 800 mg by mouth every 8 (eight) hours as needed.        Marland Kitchen levonorgestrel-ethinyl estradiol (LEVORA 0.15/30, 28,)  0.15-30 MG-MCG per tablet Take 1 tablet by mouth daily.        . Olopatadine HCl (PATADAY) 0.2 % SOLN One drop in each eye daily      . ONETOUCH DELICA LANCETS MISC 1 application by Does not apply route 2 (two) times daily.  100 each  2  . triamcinolone (NASACORT) 55 MCG/ACT nasal inhaler Place 2 sprays into the nose daily.       No current facility-administered medications on file prior to visit.   The PMH, PSH, Social History, Family History, Medications, and allergies have been reviewed in [facility], and have been updated if relevant.   Review of Systems    See HPI  Objective:   Physical Exam BP 118/80  Pulse 76  Temp(Src) 98.1 F (36.7 C)  Wt 196 lb (88.905 kg)  BMI 35.84 kg/m2  General:  Well-developed,well-nourished,in no acute distress; alert,appropriate and cooperative throughout examination Psych:  Cognition and judgment appear intact. Alert and cooperative with normal attention span and concentration. No apparent delusions, illusions, hallucinations     Assessment & Plan:   1. Newly diagnosed diabetes She is very motivated to reverse this. >25 min spent with face to face with patient, >50% counseling and/or coordinating care.  She is reluctant to start  medication but did agree to start low dose Metformin- discussed possible side effects and adverse reactions.  She will check her fasting CBG daily x 2 weeks.  Also given eat right diet hand out and discussed.  Follow up in 3 months.  See AVS.

## 2013-01-12 NOTE — Patient Instructions (Addendum)
Good to see you! I am so proud you...stay motivated! We are starting Metformin 500 mg daily with breakfast. Check your sugars daily x 2 weeks.  Call me if any blood sugars above 130 or if you have any low blood sugars.  Please follow up with me in 3 months.

## 2013-03-19 ENCOUNTER — Ambulatory Visit (INDEPENDENT_AMBULATORY_CARE_PROVIDER_SITE_OTHER): Payer: 59 | Admitting: Family Medicine

## 2013-03-19 ENCOUNTER — Encounter: Payer: Self-pay | Admitting: Family Medicine

## 2013-03-19 VITALS — BP 102/74 | HR 60 | Temp 97.9°F | Ht 62.0 in | Wt 194.0 lb

## 2013-03-19 DIAGNOSIS — M531 Cervicobrachial syndrome: Secondary | ICD-10-CM

## 2013-03-19 DIAGNOSIS — M5481 Occipital neuralgia: Secondary | ICD-10-CM | POA: Insufficient documentation

## 2013-03-19 MED ORDER — GABAPENTIN 100 MG PO CAPS: ORAL_CAPSULE | ORAL | Status: DC

## 2013-03-19 MED ORDER — ALCOHOL WIPES PADS: 1.0000 "application " | MEDICATED_PAD | Freq: Three times a day (TID) | Status: DC

## 2013-03-19 MED ORDER — GLUCOSE BLOOD VI STRP: ORAL_STRIP | Status: DC

## 2013-03-19 NOTE — Progress Notes (Signed)
Subjective:    Patient ID: Cassidy Gibson, female    DOB: Jul 14, 1971, 42 y.o.   MRN: [id number]  HPI  42 yo female here with intermittent shooting pain and tingling face, head, neck x 5 days.  Pain is bilateral.  NO UE weakness or facial droop.  No dyphagia.  Pain shoots from back of neck to face, bilaterally.  Can last minutes.  Never had anything like this before.  Patient Active Problem List   Diagnosis Date Noted  . Occipital neuralgia 03/19/2013  . Newly diagnosed diabetes 12/03/2011  . ALLERGIC RHINITIS 07/11/2010   Past Medical History  Diagnosis Date  . Allergy   . Prediabetes    Past Surgical History  Procedure Laterality Date  . Cesarean section  2001  . Adenoidectomy  1978  . Breast reduction surgery  1998  . Tympanostomy tube placement  1979   History  Substance Use Topics  . Smoking status: Never Smoker   . Smokeless tobacco: Never Used  . Alcohol Use: No   Family History  Problem Relation Age of Onset  . Breast cancer Mother   . GER disease Mother   . Cancer Mother     Breast  . Diabetes Father   . Hypertension Father   . Hyperlipidemia Brother   . Hypertension Brother   . Diabetes Maternal Aunt   . Diabetes Maternal Uncle   . Diabetes Paternal Aunt   . Diabetes Paternal Uncle   . Diabetes Maternal Grandmother   . Diabetes Paternal Grandmother   . Heart disease Paternal Grandfather    No Known Allergies Current Outpatient Prescriptions on File Prior to Visit  Medication Sig Dispense Refill  . levonorgestrel-ethinyl estradiol (LEVORA 0.15/30, 28,) 0.15-30 MG-MCG per tablet Take 1 tablet by mouth daily.        . metFORMIN (GLUCOPHAGE) 500 MG tablet Take 1 tablet (500 mg total) by mouth daily with breakfast.  30 tablet  3  . Olopatadine HCl (PATADAY) 0.2 % SOLN One drop in each eye daily      . ONETOUCH DELICA LANCETS MISC 1 application by Does not apply route 2 (two) times daily.  100 each  2  . triamcinolone (NASACORT) 55 MCG/ACT nasal  inhaler Place 2 sprays into the nose daily.      Marland Kitchen ibuprofen (ADVIL,MOTRIN) 800 MG tablet Take 800 mg by mouth every 8 (eight) hours as needed.         No current facility-administered medications on file prior to visit.   The PMH, PSH, Social History, Family History, Medications, and allergies have been reviewed in [facility], and have been updated if relevant.    Review of Systems    See HPI Objective:   Physical Exam  Constitutional: She is oriented to person, place, and time. She appears well-developed and well-nourished. No distress.  HENT:  Head: Normocephalic and atraumatic.  Eyes: Pupils are equal, round, and reactive to light.  Cardiovascular: Normal rate.   Pulmonary/Chest: Effort normal.  Abdominal: Soft.  Musculoskeletal:       Arms: Neurological: She is alert and oriented to person, place, and time. She has normal strength and normal reflexes. She displays a negative Romberg sign.  Skin: Skin is warm, dry and intact.  Psychiatric: She has a normal mood and affect. Her speech is normal and behavior is normal. Judgment and thought content normal. Cognition and memory are normal.   BP 102/74  Pulse 60  Temp(Src) 97.9 F (36.6 C)  Ht 5\' 2"  (1.575  m)  Wt 194 lb (87.998 kg)  BMI 35.47 kg/m2        Assessment & Plan:  1. Occipital neuralgia New- no other focal neuro symptoms and reassuring exam. Will treat with Gabapentin.   Pt to call me immediately if she develops any other symptoms, follow up in 2 weeks. See AVS. The patient indicates understanding of these issues and agrees with the plan.

## 2013-03-19 NOTE — Patient Instructions (Addendum)
Occipital Neuralgia Neuralgias are attacks of sharp stabbing pain. They may be intermittent (comes and goes) or constant in nature. They may be brief attacks that last seconds to minutes and may come back for days to weeks. The neuralgias can occur as a result of a herpes zoster (shingles), chickenpox infection, or even following a herpes simplex infection (cold sore). TYPES OF NEURALGIA  When these pains are located in the back of the head and neck they are called occipital neuralgias.  When the pain is located between ribs it is called intercostal neuralgia.  When the pain is located in the face it is called trigeminal neuralgia. This is the most common neuralgia. It causes sharp, shock like pain on one side of your face. The neuralgias, which follow herpes zoster infections, often produce a constant burning pain. They may last from weeks to months and even years. The attacks of pain may come from injury or inflammation (irritation) to a nerve. Often the cause is unknown. The episodes of pain may be caused by light touch, movement, or even eating and sneezing. Usually these neuralgias occur after age 32. The neuralgias following shingles and trigeminal neuralgia are the most common. Although painful, these episodes do not threaten life and tend to lessen as we grow older. TREATMENT  There are many medications that may be helpful in the treatment of this disorder. Sometimes several medications may have to be tried before the right combination can be found for you. Some of these medications are:  Only take over-the-counter or prescription medications for pain, discomfort, or fever as directed by your caregiver.  Narcotic medications may be used to control the pain.  Antidepressants and medications used in epilepsy (seizure disorders) may be useful. LET YOUR CAREGIVER KNOW ABOUT:  If you do not obtain relief from medications.  Problems that are getting worse rather than better.  Troubling  side effects that you think are coming from the medication. Do not be discouraged if you do not obtain instant relief from the medications or help given you. Your caregiver can help you get through these episodes of pain with some persistence (continued trying) on your part also. Document Released: 08/07/2001 Document Revised: 11/05/2011 Document Reviewed: 08/13/2005 [facility] Patient Information 2014 [place], [place].   We are trying Gabapentin, please also get a massage.

## 2013-04-14 ENCOUNTER — Ambulatory Visit: Payer: 59 | Admitting: Family Medicine

## 2013-04-22 ENCOUNTER — Encounter: Payer: Self-pay | Admitting: Family Medicine

## 2013-04-22 ENCOUNTER — Ambulatory Visit (INDEPENDENT_AMBULATORY_CARE_PROVIDER_SITE_OTHER): Payer: 59 | Admitting: Family Medicine

## 2013-04-22 VITALS — BP 102/70 | HR 70 | Temp 97.9°F | Ht 62.0 in | Wt 192.0 lb

## 2013-04-22 DIAGNOSIS — E119 Type 2 diabetes mellitus without complications: Secondary | ICD-10-CM

## 2013-04-22 DIAGNOSIS — J309 Allergic rhinitis, unspecified: Secondary | ICD-10-CM

## 2013-04-22 DIAGNOSIS — M5481 Occipital neuralgia: Secondary | ICD-10-CM

## 2013-04-22 DIAGNOSIS — M531 Cervicobrachial syndrome: Secondary | ICD-10-CM

## 2013-04-22 LAB — BASIC METABOLIC PANEL
Calcium: 9 mg/dL (ref 8.4–10.5)
Chloride: 108 mEq/L (ref 96–112)
GFR: 93.21 mL/min (ref >60.00)
Glucose, Bld: 105 mg/dL — ABNORMAL HIGH (ref 70–99)
Potassium: 4.1 mEq/L (ref 3.5–5.1)

## 2013-04-22 MED ORDER — METFORMIN HCL 500 MG PO TABS: 500.0000 mg | ORAL_TABLET | Freq: Every day | ORAL | Status: DC

## 2013-04-22 NOTE — Progress Notes (Signed)
Subjective:    Patient ID: Cassidy Gibson, female    DOB: 09-28-1970, 42 y.o.   MRN: [id number]  HPI  Very pleasant 42 yo female here for 3 month follow up new onset diabetes.  Had been prediabetic for years (on and off), managed by diet.  In May, a1c was 6.5, which does now classify her as a diabetic.  She does have a family history of diabetes.  She was very reluctant to start medication but finally agreed to low dose Metformin- 500 mg daily.  Initially did have stomach upset but this has resolved.  Lab Results  Component Value Date   HGBA1C 6.5 01/06/2013   Working on diet- has lost some weight. Wt Readings from Last 3 Encounters:  04/22/13 192 lb (87.091 kg)  03/19/13 194 lb (87.998 kg)  01/12/13 196 lb (88.905 kg)       Patient Active Problem List   Diagnosis Date Noted  . Occipital neuralgia 03/19/2013  . Newly diagnosed diabetes 12/03/2011  . ALLERGIC RHINITIS 07/11/2010   Past Medical History  Diagnosis Date  . Allergy   . Prediabetes    Past Surgical History  Procedure Laterality Date  . Cesarean section  2001  . Adenoidectomy  1978  . Breast reduction surgery  1998  . Tympanostomy tube placement  1979   History  Substance Use Topics  . Smoking status: Never Smoker   . Smokeless tobacco: Never Used  . Alcohol Use: No   Family History  Problem Relation Age of Onset  . Breast cancer Mother   . GER disease Mother   . Cancer Mother     Breast  . Diabetes Father   . Hypertension Father   . Hyperlipidemia Brother   . Hypertension Brother   . Diabetes Maternal Aunt   . Diabetes Maternal Uncle   . Diabetes Paternal Aunt   . Diabetes Paternal Uncle   . Diabetes Maternal Grandmother   . Diabetes Paternal Grandmother   . Heart disease Paternal Grandfather    No Known Allergies Current Outpatient Prescriptions on File Prior to Visit  Medication Sig Dispense Refill  . Alcohol Swabs (ALCOHOL WIPES) PADS 1 application by Does not apply route 3 (three)  times daily.  90 each  3  . gabapentin (NEURONTIN) 100 MG capsule 1 capsule at bedtime, increase in 3 capsules at bedtime after 2 nights.  Ok to increase up to 3 capsules three times daily if tolerating.  60 capsule  0  . glucose blood (ONE TOUCH TEST STRIPS) test strip Use as instructed  100 each  12  . ibuprofen (ADVIL,MOTRIN) 800 MG tablet Take 800 mg by mouth every 8 (eight) hours as needed.        Marland Kitchen levonorgestrel-ethinyl estradiol (LEVORA 0.15/30, 28,) 0.15-30 MG-MCG per tablet Take 1 tablet by mouth daily.        . metFORMIN (GLUCOPHAGE) 500 MG tablet Take 1 tablet (500 mg total) by mouth daily with breakfast.  30 tablet  3  . Olopatadine HCl (PATADAY) 0.2 % SOLN One drop in each eye daily      . ONETOUCH DELICA LANCETS MISC 1 application by Does not apply route 2 (two) times daily.  100 each  2  . triamcinolone (NASACORT) 55 MCG/ACT nasal inhaler Place 2 sprays into the nose daily.       No current facility-administered medications on file prior to visit.   The PMH, PSH, Social History, Family History, Medications, and allergies have been reviewed in  [facility], and have been updated if relevant.   Review of Systems    See HPI  Objective:   Physical Exam BP 102/70  Pulse 70  Temp(Src) 97.9 F (36.6 C)  Ht 5\' 2"  (1.575 m)  Wt 192 lb (87.091 kg)  BMI 35.11 kg/m2 Wt Readings from Last 3 Encounters:  04/22/13 192 lb (87.091 kg)  03/19/13 194 lb (87.998 kg)  01/12/13 196 lb (88.905 kg)    General:  Well-developed,well-nourished,in no acute distress; alert,appropriate and cooperative throughout examination Psych:  Cognition and judgment appear intact. Alert and cooperative with normal attention span and concentration. No apparent delusions, illusions, hallucinations Diabetic foot exam: Normal inspection No skin breakdown No calluses  Normal DP pulses Normal sensation to light touch and monofilament Nails normal      Assessment & Plan:   1. Newly diagnosed  diabetes Improving.  Recheck labs today.  Continue current dose of Metformin.  She ultimately would like to continue diabetes with diet alone. Orders Placed This Encounter  Procedures  . Hemoglobin A1c  . Basic Metabolic Panel  . HM Diabetes Foot Exam

## 2013-04-22 NOTE — Patient Instructions (Signed)
Good to see you. We will call you with your lab results and you can view them on mychart.

## 2013-07-02 ENCOUNTER — Other Ambulatory Visit: Payer: Self-pay

## 2013-07-22 ENCOUNTER — Ambulatory Visit: Payer: 59 | Admitting: Family Medicine

## 2013-10-16 ENCOUNTER — Encounter (HOSPITAL_COMMUNITY): Payer: Self-pay | Admitting: Emergency Medicine

## 2013-10-16 ENCOUNTER — Emergency Department (HOSPITAL_COMMUNITY)
Admission: EM | Admit: 2013-10-16 | Discharge: 2013-10-16 | Disposition: A | Discharge status: Home / Self Care | Payer: 59 | Source: Home / Self Care | Attending: Family Medicine | Admitting: Family Medicine

## 2013-10-16 DIAGNOSIS — J329 Chronic sinusitis, unspecified: Secondary | ICD-10-CM

## 2013-10-16 MED ORDER — CEFTRIAXONE SODIUM 1 G IJ SOLR
INTRAMUSCULAR | Status: AC
  Filled 2013-10-16: qty 10

## 2013-10-16 MED ORDER — AMOXICILLIN-POT CLAVULANATE 875-125 MG PO TABS: 1.0000 | ORAL_TABLET | Freq: Two times a day (BID) | ORAL | Status: DC

## 2013-10-16 MED ORDER — DEXAMETHASONE SODIUM PHOSPHATE 10 MG/ML IJ SOLN
INTRAMUSCULAR | Status: AC
  Filled 2013-10-16: qty 1

## 2013-10-16 MED ORDER — METHYLPREDNISOLONE 4 MG PO KIT: PACK | ORAL | Status: DC

## 2013-10-16 MED ORDER — CEFTRIAXONE SODIUM 1 G IJ SOLR
1.0000 g | Freq: Once | INTRAMUSCULAR | Status: AC
  Administered 2013-10-16: 1 g via INTRAMUSCULAR

## 2013-10-16 MED ORDER — LIDOCAINE HCL (PF) 1 % IJ SOLN
INTRAMUSCULAR | Status: AC
  Filled 2013-10-16: qty 5

## 2013-10-16 MED ORDER — DEXAMETHASONE SODIUM PHOSPHATE 10 MG/ML IJ SOLN
10.0000 mg | Freq: Once | INTRAMUSCULAR | Status: AC
  Administered 2013-10-16: 10 mg via INTRAMUSCULAR

## 2013-10-16 NOTE — ED Notes (Signed)
C/o sinus pressure and pain.  Popping in ears with swallowing.  States "head feels full".  Symptoms present x 1 wk.  No relief with otc meds.  Denies fever and any other symptoms.

## 2013-10-16 NOTE — ED Notes (Signed)
Pt given injection will discharge at 8:20 p.m

## 2013-10-16 NOTE — Discharge Instructions (Signed)

## 2013-10-16 NOTE — ED Provider Notes (Signed)
CSN: [id number]     Arrival date & time 10/16/13  1757 History   First MD Initiated Contact with Patient 10/16/13 2001     Chief Complaint  Patient presents with  . Facial Pain     (Consider location/radiation/quality/duration/timing/severity/associated sxs/prior Treatment) HPI Comments: 43 year old female presents complaining of just over one week of sinus pressure, severe nasal congestion, posterior nasal drainage, sore throat. She also says that her right maxillary sinuses are very tender. She also admits to some mild bilateral ear fullness. She has had a sinus infection a couple of times in the past and she believes that may be what is going on. She has been taking numerous over-the-counter medications which are not providing significant relief. She is also been using Nasacort allergy, 1 spray per nostril once daily, she has not had a noticeable change since starting this year. She denies fever, chills, cough, shortness of breath. No recent travel or sick contacts   Past Medical History  Diagnosis Date  . Allergy   . Prediabetes    Past Surgical History  Procedure Laterality Date  . Cesarean section  2001  . Adenoidectomy  1978  . Breast reduction surgery  1998  . Tympanostomy tube placement  1979   Family History  Problem Relation Age of Onset  . Breast cancer Mother   . GER disease Mother   . Cancer Mother     Breast  . Diabetes Father   . Hypertension Father   . Hyperlipidemia Brother   . Hypertension Brother   . Heart disease Brother   . Diabetes Maternal Aunt   . Diabetes Maternal Uncle   . Diabetes Paternal Aunt   . Diabetes Paternal Uncle   . Diabetes Maternal Grandmother   . Diabetes Paternal Grandmother   . Heart disease Paternal Grandfather    History  Substance Use Topics  . Smoking status: Never Smoker   . Smokeless tobacco: Never Used  . Alcohol Use: No   OB History   Grav Para Term Preterm Abortions TAB SAB Ect Mult Living                  Review of Systems  Constitutional: Negative for fever and chills.  HENT: Positive for congestion, ear pain, postnasal drip, rhinorrhea, sinus pressure and sore throat. Negative for ear discharge, sneezing and trouble swallowing.   Eyes: Negative for visual disturbance.  Respiratory: Negative for cough, chest tightness and shortness of breath.   Cardiovascular: Negative for chest pain, palpitations and leg swelling.  Gastrointestinal: Negative for nausea, vomiting and abdominal pain.  Endocrine: Negative for polydipsia and polyuria.  Genitourinary: Negative for dysuria, urgency and frequency.  Musculoskeletal: Negative for arthralgias and myalgias.  Skin: Negative for rash.  Neurological: Negative for dizziness, weakness and light-headedness.      Allergies  Review of patient's allergies indicates no known allergies.  Home Medications   Current Outpatient Rx  Name  Route  Sig  Dispense  Refill  . levonorgestrel-ethinyl estradiol (LEVORA 0.15/30, 28,) 0.15-30 MG-MCG per tablet   Oral   Take 1 tablet by mouth daily.           . metFORMIN (GLUCOPHAGE) 500 MG tablet   Oral   Take 1 tablet (500 mg total) by mouth daily with breakfast.   90 tablet   3   . triamcinolone (NASACORT) 55 MCG/ACT nasal inhaler   Nasal   Place 2 sprays into the nose daily.         Marland Kitchen  Alcohol Swabs (ALCOHOL WIPES) PADS   Does not apply   1 application by Does not apply route 3 (three) times daily.   90 each   3   . amoxicillin-clavulanate (AUGMENTIN) 875-125 MG per tablet   Oral   Take 1 tablet by mouth every 12 (twelve) hours.   20 tablet   0   . gabapentin (NEURONTIN) 100 MG capsule      1 capsule at bedtime, increase in 3 capsules at bedtime after 2 nights.  Ok to increase up to 3 capsules three times daily if tolerating.   60 capsule   0   . glucose blood (ONE TOUCH TEST STRIPS) test strip      Use as instructed   100 each   12   . ibuprofen (ADVIL,MOTRIN) 800 MG tablet    Oral   Take 800 mg by mouth every 8 (eight) hours as needed.           . methylPREDNISolone (MEDROL DOSEPAK) 4 MG tablet      follow package directions   21 tablet   0     Dispense as written.   . Olopatadine HCl (PATADAY) 0.2 % SOLN      One drop in each eye daily         . ONETOUCH DELICA LANCETS MISC   Does not apply   1 application by Does not apply route 2 (two) times daily.   100 each   2    BP 121/79  Pulse 66  Temp(Src) 98 F (36.7 C) (Oral)  Resp 16  SpO2 100%  LMP 10/09/2013 Physical Exam  Nursing note and vitals reviewed. Constitutional: She is oriented to person, place, and time. Vital signs are normal. She appears well-developed and well-nourished. No distress.  HENT:  Head: Normocephalic and atraumatic.  Right Ear: Tympanic membrane, external ear and ear canal normal.  Left Ear: Tympanic membrane, external ear and ear canal normal.  Nose: Mucosal edema, rhinorrhea and sinus tenderness present. Right sinus exhibits maxillary sinus tenderness. Right sinus exhibits no frontal sinus tenderness. Left sinus exhibits maxillary sinus tenderness. Left sinus exhibits no frontal sinus tenderness.  Mouth/Throat: Uvula is midline, oropharynx is clear and moist and mucous membranes are normal. No oropharyngeal exudate.  Neck: Normal range of motion. Neck supple.  Pulmonary/Chest: Effort normal. No respiratory distress.  Lymphadenopathy:    She has cervical adenopathy (tonsillar, bilateral).  Neurological: She is alert and oriented to person, place, and time. She has normal strength. Coordination normal.  Skin: Skin is warm and dry. No rash noted. She is not diaphoretic.  Psychiatric: She has a normal mood and affect. Judgment normal.    ED Course  Procedures (including critical care time) Labs Review Labs Reviewed - No data to display Imaging Review No results found.    MDM   Final diagnoses:  Sinusitis    Probable bacterial sinusitis given her  worsening after one entire week. We'll give Decadron and Rocephin here and discharged on Augmentin and Medrol Dosepak. She will increase her dose of Nasacort allergy to 2 sprays per nostril twice daily for a period of 4 days and then go back down to her regular dosage. She will followup if she is not improving in a few days.   Meds ordered this encounter  Medications  . dexamethasone (DECADRON) injection 10 mg    Sig:   . cefTRIAXone (ROCEPHIN) injection 1 g    Sig:   . amoxicillin-clavulanate (AUGMENTIN) 875-125 MG per  tablet    Sig: Take 1 tablet by mouth every 12 (twelve) hours.    Dispense:  20 tablet    Refill:  0    Order Specific Question:  Supervising Provider    Answer:  Lynne Leader, [place]  . methylPREDNISolone (MEDROL DOSEPAK) 4 MG tablet    Sig: follow package directions    Dispense:  21 tablet    Refill:  0    Order Specific Question:  Supervising Provider    Answer:  Lynne Leader, [place]       Liam Graham, PA-C 10/16/13 2008

## 2013-10-18 NOTE — ED Provider Notes (Signed)
Medical screening examination/treatment/procedure(s) were performed by a resident physician or non-physician practitioner and as the supervising physician I was immediately available for consultation/collaboration.  Lynne Leader, MD    Gregor Hams, MD 10/18/13 [phone]

## 2013-12-02 ENCOUNTER — Ambulatory Visit (INDEPENDENT_AMBULATORY_CARE_PROVIDER_SITE_OTHER): Payer: 59 | Admitting: Family Medicine

## 2013-12-02 ENCOUNTER — Encounter: Payer: Self-pay | Admitting: Family Medicine

## 2013-12-02 VITALS — BP 118/72 | HR 82 | Temp 97.9°F | Wt 193.2 lb

## 2013-12-02 DIAGNOSIS — E049 Nontoxic goiter, unspecified: Secondary | ICD-10-CM

## 2013-12-02 DIAGNOSIS — J029 Acute pharyngitis, unspecified: Secondary | ICD-10-CM

## 2013-12-02 DIAGNOSIS — E010 Iodine-deficiency related diffuse (endemic) goiter: Secondary | ICD-10-CM

## 2013-12-02 MED ORDER — AMOXICILLIN 875 MG PO TABS: 875.0000 mg | ORAL_TABLET | Freq: Two times a day (BID) | ORAL | Status: DC

## 2013-12-02 MED ORDER — PREDNISONE 20 MG PO TABS: ORAL_TABLET | ORAL | Status: DC

## 2013-12-02 MED ORDER — AMOXICILLIN-POT CLAVULANATE 875-125 MG PO TABS: 1.0000 | ORAL_TABLET | Freq: Two times a day (BID) | ORAL | Status: DC

## 2013-12-02 NOTE — Assessment & Plan Note (Signed)
?  R thyroid nodule - will order thyroid ultrasound.  Pt agrees.  Planning on leaving country in August for 3 yrs ([place] trip to [place]).

## 2013-12-02 NOTE — Patient Instructions (Signed)
Pass by Marion's office to schedule thyroid ultrasound You have pharyngitis and uvulitis or inflammation of uvula. Treat with prednisone taper as well as antibiotic course. Push fluids and plenty of rest. Salt water gargles. Suck on cold things like popsicles or warm things like herbal teas (whichever soothes the throat better). Return if fever >101.5, worsening pain, or trouble opening/closing mouth, or hoarse voice. Good to see you today, call clinic with questions.

## 2013-12-02 NOTE — Assessment & Plan Note (Addendum)
With uvulitis and tonsillar exudates - high suspicion for strep.  Check RST today negative. Regardless treat with antibiotic (amox) and prednisone taper. Supportive care as per instructions. Red flags to return provided. Pt agrees with plan.

## 2013-12-02 NOTE — Progress Notes (Signed)
BP 118/72  Pulse 82  Temp(Src) 97.9 F (36.6 C) (Oral)  Wt 193 lb 4 oz (87.658 kg)  SpO2 97%   CC: swollen uvula  Subjective:    Patient ID: Cassidy Gibson, female    DOB: 12-Sep-1970, 43 y.o.   MRN: [id number]  HPI: Cassidy Gibson is a 43 y.o. female presenting on 12/02/2013 for uvulitis and Sore Throat   Cassidy Gibson presents today with concerns over sore throat and swollen uvula that started 2 days ago.  Progressively worsening.  Gagging with anything she eats or drinks.  H/o this in past, treated with abx and sxs resolved.  + productive cough of green mucous and nasal congestion.  + PNDrainage.  No fevers/chills, headache or abd pain/nausea, ear or tooth pain.  No sick contacts at home. No smokers at home.  No h/o asthma.  + seasonal allergies but currently doing well.  Treated with claritin and pataday and nasacort.  Some R thyroid enlargement on exam - pt denies radiation exposure, personal or fmhx thyroid disease.  Relevant past medical, surgical, family and social history reviewed and updated as indicated.  Allergies and medications reviewed and updated. Current Outpatient Prescriptions on File Prior to Visit  Medication Sig  . Alcohol Swabs (ALCOHOL WIPES) PADS 1 application by Does not apply route 3 (three) times daily.  Marland Kitchen gabapentin (NEURONTIN) 100 MG capsule 1 capsule at bedtime, increase in 3 capsules at bedtime after 2 nights.  Ok to increase up to 3 capsules three times daily if tolerating.  Marland Kitchen glucose blood (ONE TOUCH TEST STRIPS) test strip Use as instructed  . ibuprofen (ADVIL,MOTRIN) 800 MG tablet Take 800 mg by mouth every 8 (eight) hours as needed.    . metFORMIN (GLUCOPHAGE) 500 MG tablet Take 1 tablet (500 mg total) by mouth daily with breakfast.  . Olopatadine HCl (PATADAY) 0.2 % SOLN One drop in each eye daily  . ONETOUCH DELICA LANCETS MISC 1 application by Does not apply route 2 (two) times daily.  Marland Kitchen triamcinolone (NASACORT) 55 MCG/ACT nasal inhaler Place 2  sprays into the nose daily.   No current facility-administered medications on file prior to visit.    Review of Systems Per HPI unless specifically indicated above    Objective:    BP 118/72  Pulse 82  Temp(Src) 97.9 F (36.6 C) (Oral)  Wt 193 lb 4 oz (87.658 kg)  SpO2 97%  Physical Exam  Nursing note and vitals reviewed. Constitutional: She appears well-developed and well-nourished. No distress.  HENT:  Head: Normocephalic and atraumatic.  Right Ear: Hearing, tympanic membrane, external ear and ear canal normal.  Left Ear: Hearing, tympanic membrane, external ear and ear canal normal.  Nose: Mucosal edema present. No rhinorrhea. Right sinus exhibits no maxillary sinus tenderness and no frontal sinus tenderness. Left sinus exhibits no maxillary sinus tenderness and no frontal sinus tenderness.  Mouth/Throat: Uvula is midline and mucous membranes are normal. Uvula swelling present. Oropharyngeal exudate, posterior oropharyngeal edema and posterior oropharyngeal erythema present. No tonsillar abscesses.  Scarring L TM L tonsillar exudate.  + erythematous and swollen uvula. Hoarse voice  Eyes: Conjunctivae and EOM are normal. Pupils are equal, round, and reactive to light. No scleral icterus.  Neck: Normal range of motion. Neck supple. Thyromegaly (?R thyroid nodule) present.  Cardiovascular: Normal rate, regular rhythm, normal heart sounds and intact distal pulses.   No murmur heard. Pulmonary/Chest: Effort normal and breath sounds normal. No respiratory distress. She has no wheezes. She has no  rales.  Lymphadenopathy:    She has cervical adenopathy (R AC LAD).  Skin: Skin is warm and dry. No rash noted.       Assessment & Plan:   Problem List Items Addressed This Visit   Thyromegaly     ?R thyroid nodule - will order thyroid ultrasound.  Pt agrees.  Planning on leaving country in August for 3 yrs ([place] trip to [place]).    Relevant Orders      US Soft Tissue  Head/Neck   Acute pharyngitis - Primary     With uvulitis and tonsillar exudates - high suspicion for strep.  Check RST today negative. Regardless treat with antibiotic (amox) and prednisone taper. Supportive care as per instructions. Red flags to return provided. Pt agrees with plan.        Follow up plan: Return if symptoms worsen or fail to improve.

## 2013-12-02 NOTE — Progress Notes (Signed)
Pre visit review using our clinic review tool, if applicable. No additional management support is needed unless otherwise documented below in the visit note. 

## 2013-12-03 ENCOUNTER — Other Ambulatory Visit: Payer: Self-pay

## 2013-12-04 ENCOUNTER — Ambulatory Visit (HOSPITAL_COMMUNITY)
Admission: RE | Admit: 2013-12-04 | Discharge: 2013-12-04 | Disposition: A | Discharge status: Home / Self Care | Payer: 59 | Source: Ambulatory Visit | Attending: Family Medicine | Admitting: Family Medicine

## 2013-12-04 DIAGNOSIS — E041 Nontoxic single thyroid nodule: Secondary | ICD-10-CM | POA: Insufficient documentation

## 2013-12-04 DIAGNOSIS — E010 Iodine-deficiency related diffuse (endemic) goiter: Secondary | ICD-10-CM

## 2013-12-04 IMAGING — US US SOFT TISSUE HEAD/NECK
1 series · 14 of 25 positions shown · non-contrast
Comparison: None

CLINICAL DATA: Thyromegaly

EXAM:
THYROID ULTRASOUND
TECHNIQUE: Ultrasound examination of the thyroid gland and adjacent soft
tissues was performed.

[Series 1: us soft tissue head/neck · 0.07mm/px · 14 of 36 slices shown]
[im 1/36]
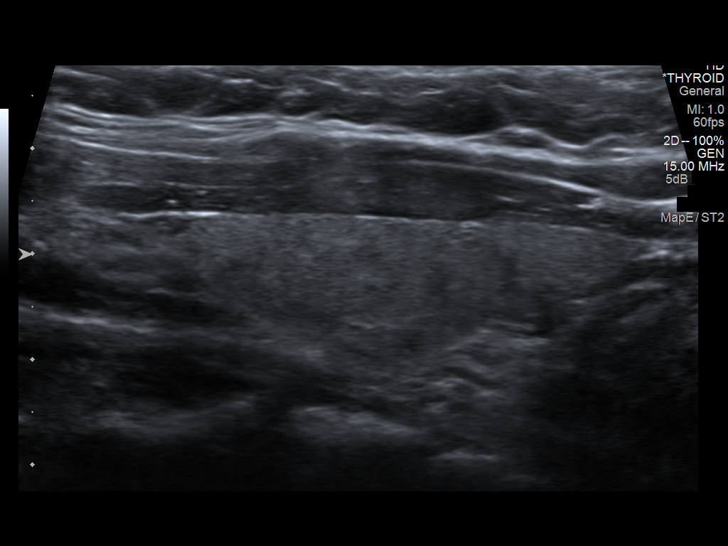
[im 3/36]
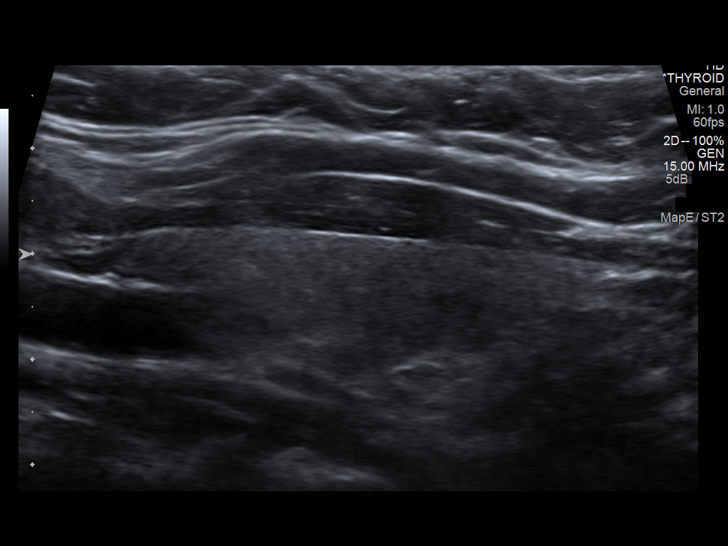
[im 6/36]
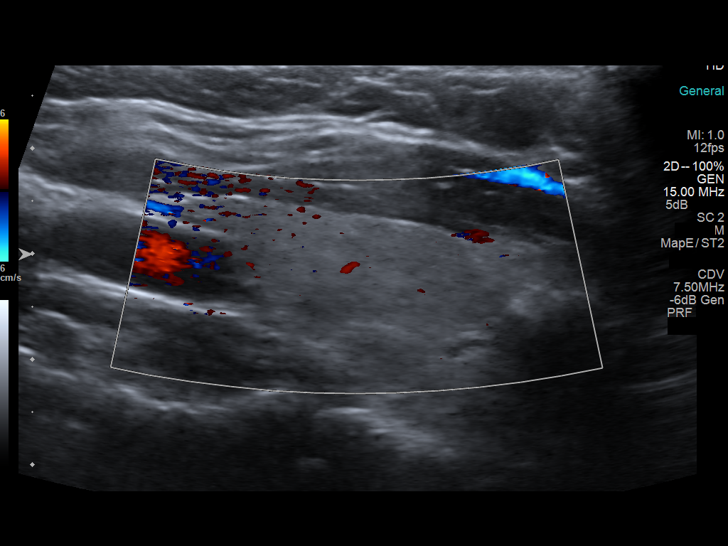
[im 9/36]
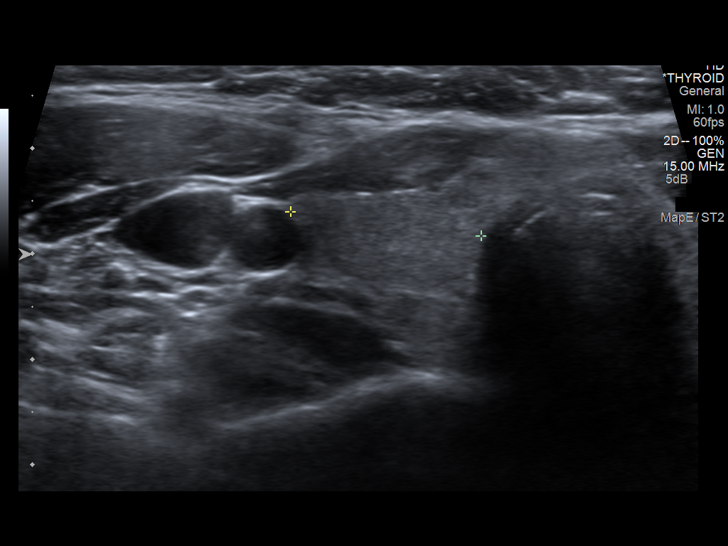
[im 12/36]
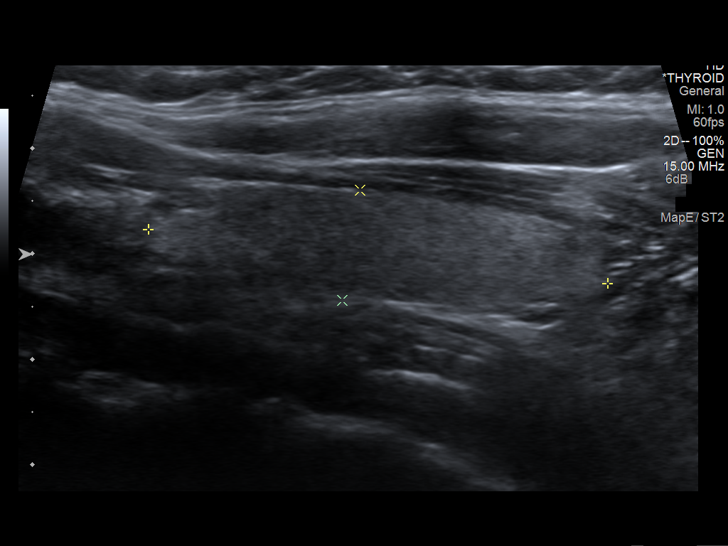
[im 14/36]
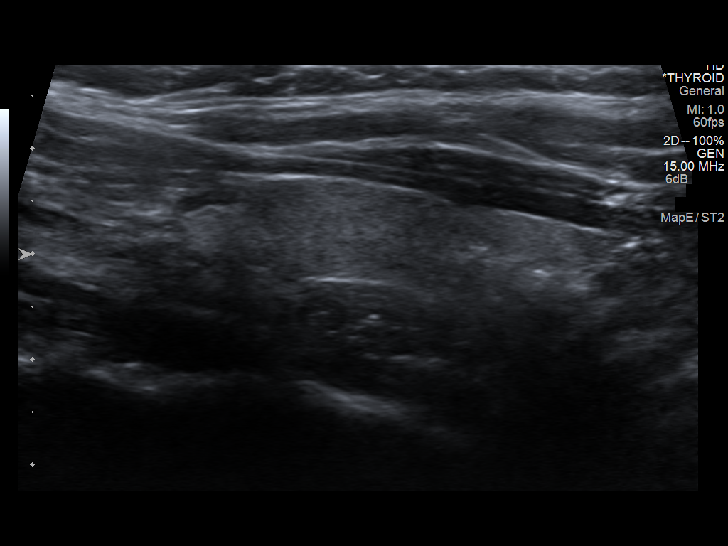
[im 17/36]
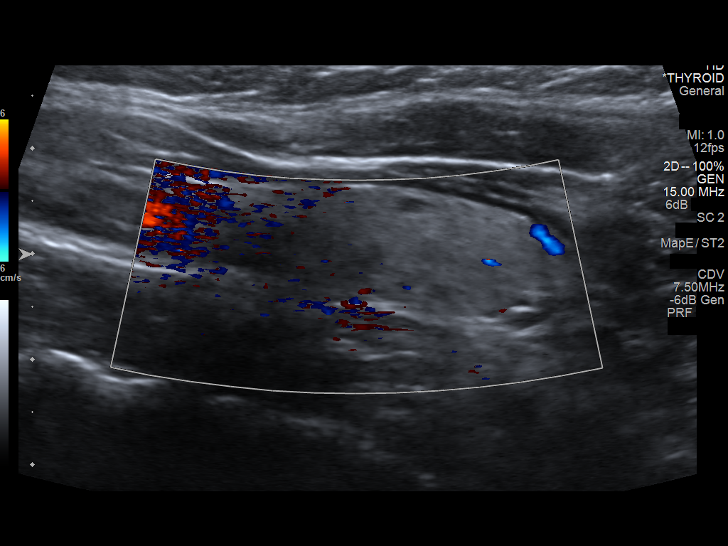
[im 19/36]
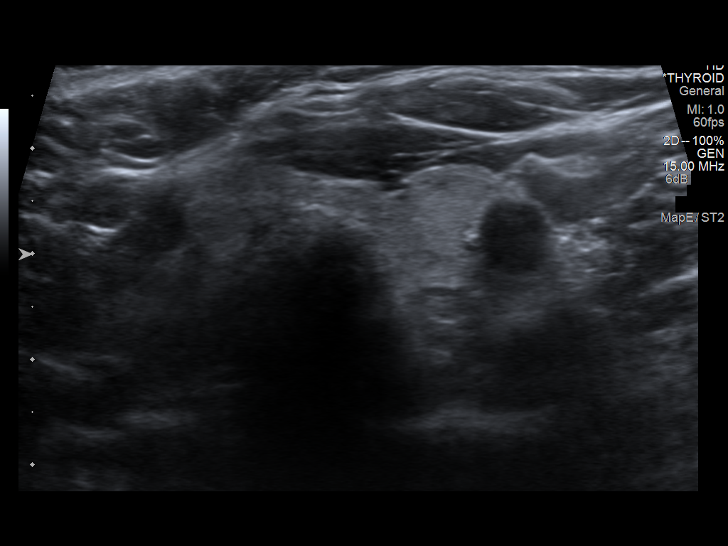
[im 22/36]
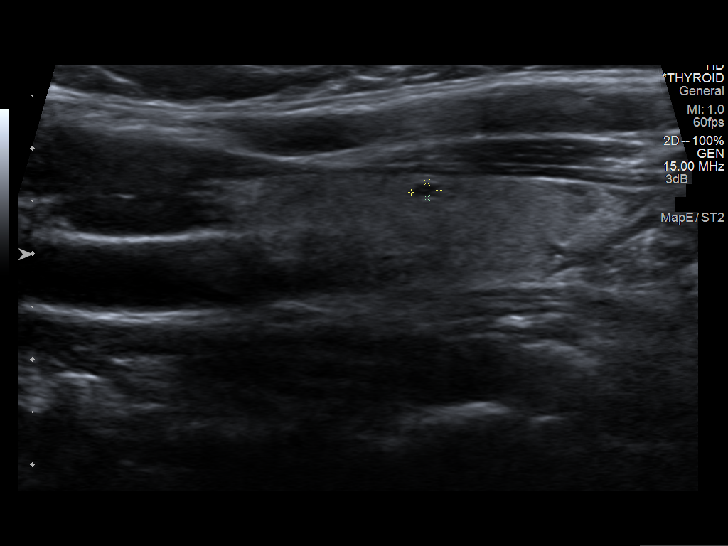
[im 24/36]
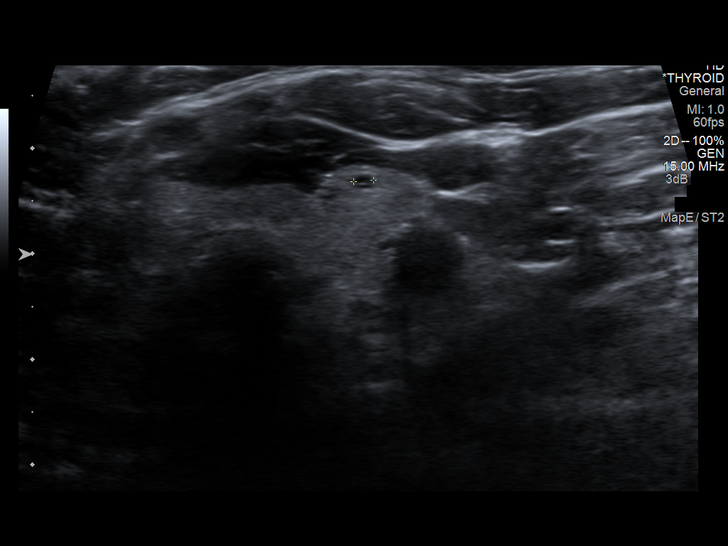
[im 27/36]
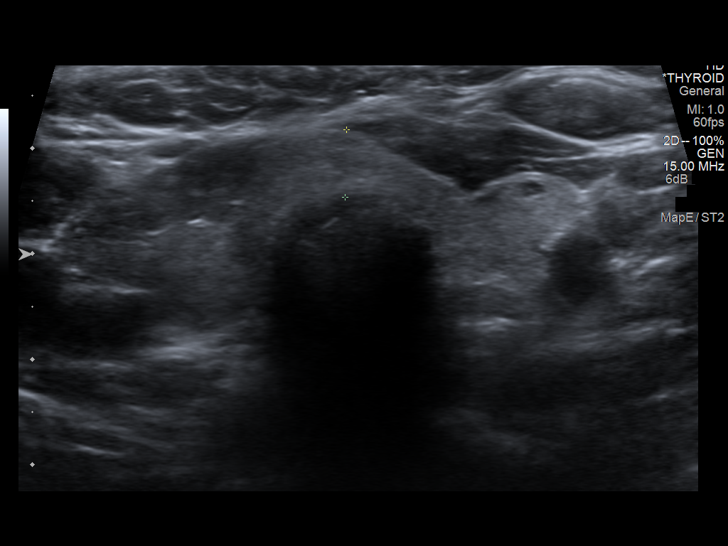
[im 30/36]
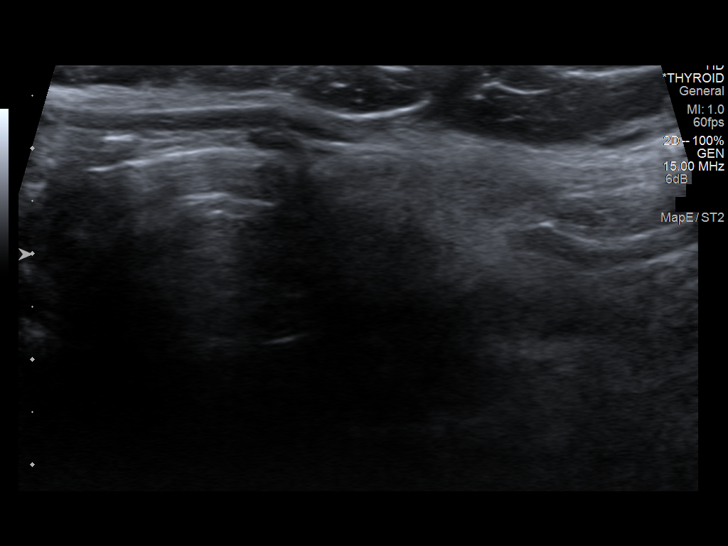
[im 33/36]
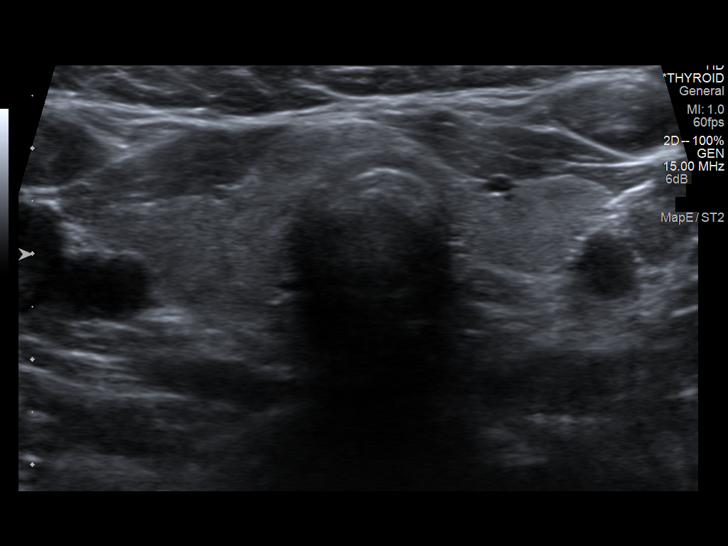
[im 36/36]
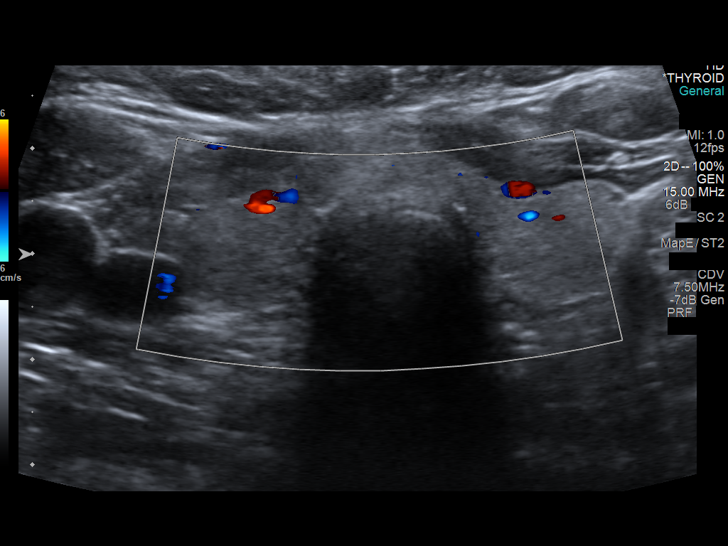

[14 of 25 positions shown; findings below may reference images not displayed]

FINDINGS: Right thyroid lobe

Measurements: 4.7 x 1.6 x 1.8 cm. Homogeneous echogenicity without
mass or calcification.

Left thyroid lobe

Measurements: 4.4 x 1.1 x 1.2 cm. Tiny cyst mid left lobe 3 x 2 x 2
mm. No solid mass or calcification.

Isthmus

Thickness: 6 mm thick. Solid isthmic nodule questioned, poorly
defined, isoechoic to thyroid parenchyma, located at right lateral
aspect of isthmus, 11 x 7 x 11 mm.

Lymphadenopathy

None
IMPRESSION: Nonspecific 11 x 7 x 11 mm isthmic nodule.

Findings do not meet current SRU consensus criteria for biopsy.
Follow-up by clinical exam is recommended. If patient has known risk
factors for thyroid carcinoma, consider follow-up ultrasound in 12
months. If patient is clinically hyperthyroid, consider nuclear
medicine thyroid uptake and scan. Reference: Management of Thyroid
Nodules Detected at US: Society of Radiologists in Ultrasound

## 2014-04-19 ENCOUNTER — Ambulatory Visit (INDEPENDENT_AMBULATORY_CARE_PROVIDER_SITE_OTHER): Payer: 59 | Admitting: Family Medicine

## 2014-04-19 ENCOUNTER — Encounter: Payer: Self-pay | Admitting: Family Medicine

## 2014-04-19 VITALS — BP 114/70 | HR 96 | Temp 98.1°F | Wt 200.8 lb

## 2014-04-19 DIAGNOSIS — E1069 Type 1 diabetes mellitus with other specified complication: Secondary | ICD-10-CM

## 2014-04-19 LAB — MICROALBUMIN / CREATININE URINE RATIO
CREATININE, U: 219.2 mg/dL
Microalb Creat Ratio: 0.3 mg/g (ref 0.0–30.0)
Microalb, Ur: 0.7 mg/dL (ref 0.0–1.9)

## 2014-04-19 LAB — LIPID PANEL
CHOLESTEROL: 150 mg/dL (ref 0–200)
HDL: 54.5 mg/dL (ref >39.00)
LDL Cholesterol: 82 mg/dL (ref 0–99)
NonHDL: 95.5
TRIGLYCERIDES: 66 mg/dL (ref 0.0–149.0)
Total CHOL/HDL Ratio: 3
VLDL: 13.2 mg/dL (ref 0.0–40.0)

## 2014-04-19 LAB — COMPREHENSIVE METABOLIC PANEL
ALBUMIN: 3.9 g/dL (ref 3.5–5.2)
ALT: 40 U/L — ABNORMAL HIGH (ref 0–35)
AST: 27 U/L (ref 0–37)
Alkaline Phosphatase: 68 U/L (ref 39–117)
BUN: 12 mg/dL (ref 6–23)
CO2: 28 mEq/L (ref 19–32)
Calcium: 9.1 mg/dL (ref 8.4–10.5)
Chloride: 104 mEq/L (ref 96–112)
Creatinine, Ser: 0.7 mg/dL (ref 0.4–1.2)
GFR: 106.06 mL/min (ref >60.00)
GLUCOSE: 94 mg/dL (ref 70–99)
POTASSIUM: 4.5 meq/L (ref 3.5–5.1)
Sodium: 139 mEq/L (ref 135–145)
Total Bilirubin: 0.6 mg/dL (ref 0.2–1.2)
Total Protein: 7.4 g/dL (ref 6.0–8.3)

## 2014-04-19 LAB — HEMOGLOBIN A1C: Hgb A1c MFr Bld: 6.2 % (ref 4.6–6.5)

## 2014-04-19 NOTE — Progress Notes (Signed)
Subjective:    Patient ID: Cassidy Gibson, female    DOB: May 09, 1971, 42 y.o.   MRN: [id number]  HPI  Very pleasant 43 yo female here for follow up diabetes. I have not seen her in over a year.  In May 2014, a1c was 6.5, which does now classify her as a diabetic.  She does have a family history of diabetes.  She was very reluctant to start medication but finally agreed to low dose Metformin- 500 mg twice daily.  Initially did have stomach upset but this has resolved.  Overdue for labs.  Checks fasting FSBS twice a week- ranging in low 80s. Denies any episodes of hypoglycemia.  Lab Results  Component Value Date   HGBA1C 6.2 04/22/2013   Working on diet with nutritionist.  Working out 5 days per week for last month. 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    Patient Active Problem List   Diagnosis Date Noted  . Thyromegaly 12/02/2013  . Occipital neuralgia 03/19/2013  . Type II or unspecified type diabetes mellitus without mention of complication, not stated as uncontrolled 12/03/2011  . ALLERGIC RHINITIS 07/11/2010   Past Medical History  Diagnosis Date  . Allergy   . Prediabetes    Past Surgical History  Procedure Laterality Date  . Cesarean section  2001  . Adenoidectomy  1978  . Breast reduction surgery  1998  . Tympanostomy tube placement  1979   History  Substance Use Topics  . Smoking status: Never Smoker   . Smokeless tobacco: Never Used  . Alcohol Use: No   Family History  Problem Relation Age of Onset  . Breast cancer Mother   . GER disease Mother   . Cancer Mother    Breast  . Diabetes Father   . Hypertension Father   . Hyperlipidemia Brother   . Hypertension Brother   . Heart disease Brother   . Diabetes Maternal Aunt   . Diabetes Maternal Uncle   . Diabetes Paternal Aunt   . Diabetes Paternal Uncle   . Diabetes Maternal Grandmother   . Diabetes Paternal Grandmother   . Heart disease Paternal Grandfather    No Known Allergies Current Outpatient Prescriptions on File Prior to Visit  Medication Sig Dispense Refill  . Alcohol Swabs (ALCOHOL WIPES) PADS 1 application by Does not apply route 3 (three) times daily.  90 each  3  . glucose blood (ONE TOUCH TEST STRIPS) test strip Use as instructed  100 each  12  . ibuprofen (ADVIL,MOTRIN) 800 MG tablet Take 800 mg by mouth every 8 (eight) hours as needed.        . metFORMIN (GLUCOPHAGE) 500 MG tablet Take 1 tablet (500 mg total) by mouth daily with breakfast.  90 tablet  3  . Olopatadine HCl (PATADAY) 0.2 % SOLN One drop in each eye daily      . ONETOUCH DELICA LANCETS MISC 1 application by Does not apply route 2 (two) times daily.  100 each  2  .  triamcinolone (NASACORT) 55 MCG/ACT nasal inhaler Place 2 sprays into the nose daily.       No current facility-administered medications on file prior to visit.   The PMH, PSH, Social History, Family History, Medications, and allergies have been reviewed in [facility], and have been updated if relevant.   Review of Systems    See HPI Denies increased thirst or urination No n/v/d No LE edema No abdominal pain No syncope No blurred vision Objective:   Physical Exam BP 114/70  Pulse 96  Temp(Src) 98.1 F (36.7 C) (Oral)  Wt 200 lb 12 oz (91.06 kg)  SpO2 97% Wt Readings from Last 3 Encounters:  04/19/14 200 lb 12 oz (91.06 kg)  12/02/13 193 lb 4 oz (87.658 kg)  04/22/13 192 lb (87.091 kg)    General:  Well-developed,well-nourished,in no acute distress; alert,appropriate and cooperative throughout examination CVS:  RRR Resp:  CTA bilaterally Psych:   Cognition and judgment appear intact. Alert and cooperative with normal attention span and concentration. No apparent delusions, illusions, hallucinations Neuro:  CNII- XII grossly intact, normal gait      Assessment & Plan:

## 2014-04-19 NOTE — Assessment & Plan Note (Signed)
Improved per pt report. Due for labs and urine micro today.  We may be able to wean down Metformin dose based on those results. The patient indicates understanding of these issues and agrees with the plan.  Orders Placed This Encounter  Procedures  . Microalbumin / creatinine urine ratio  . Hemoglobin A1c  . Lipid panel  . Comprehensive metabolic panel

## 2014-04-19 NOTE — Progress Notes (Signed)
Pre visit review using our clinic review tool, if applicable. No additional management support is needed unless otherwise documented below in the visit note. 

## 2014-04-19 NOTE — Patient Instructions (Signed)
Great to see you. We will call you with your lab results.  Come see me before you leave.

## 2014-04-20 ENCOUNTER — Encounter: Payer: Self-pay | Admitting: Family Medicine

## 2014-04-20 NOTE — Telephone Encounter (Signed)
Pt responded to via mychart and advised per Dr Deborra Medina to stop taking Metformin completely. Pt also advised to have labs repeated in 54mos

## 2014-06-11 ENCOUNTER — Other Ambulatory Visit: Payer: Self-pay

## 2014-09-06 ENCOUNTER — Telehealth: Payer: Self-pay | Admitting: Radiology

## 2014-09-06 ENCOUNTER — Other Ambulatory Visit: Payer: Self-pay | Admitting: Family Medicine

## 2014-09-06 DIAGNOSIS — IMO0002 Reserved for concepts with insufficient information to code with codable children: Secondary | ICD-10-CM

## 2014-09-06 NOTE — Telephone Encounter (Signed)
Patient and her family are going on a mission, they need to have blood type and RH done on all three family members.  Cassidy Gibson - 5.30.1966 ( Dr Josefine Class pt) I did not send to him Cassidy Gibson - 03/31/1971 Cassidy Gibson - 8.3.2000

## 2014-09-06 NOTE — Telephone Encounter (Signed)
Not sure if insurance will cover it.  I guess you can try: [id number]

## 2014-09-06 NOTE — Telephone Encounter (Signed)
Ok to order labs for them.

## 2014-09-06 NOTE — Telephone Encounter (Signed)
What DX do I use?

## 2014-09-06 NOTE — Telephone Encounter (Signed)
[facility] notifying pt they can call and schedule appt

## 2014-09-09 ENCOUNTER — Other Ambulatory Visit (INDEPENDENT_AMBULATORY_CARE_PROVIDER_SITE_OTHER): Payer: 59

## 2014-09-09 DIAGNOSIS — Z0000 Encounter for general adult medical examination without abnormal findings: Secondary | ICD-10-CM

## 2014-09-09 DIAGNOSIS — IMO0002 Reserved for concepts with insufficient information to code with codable children: Secondary | ICD-10-CM

## 2014-09-10 LAB — ABO AND RH: Rh Type: POSITIVE

## 2014-09-14 ENCOUNTER — Telehealth: Payer: Self-pay | Admitting: Family Medicine

## 2014-09-14 NOTE — Telephone Encounter (Signed)
Pt called to get lab results she wanted to know her blood type. Can leave detail message on cell phone

## 2014-09-14 NOTE — Telephone Encounter (Signed)
Spoke to pt and advised her of blood type

## 2014-09-14 NOTE — Telephone Encounter (Signed)
A positive.  Please give her a copy of results if she would like them.

## 2014-10-20 ENCOUNTER — Ambulatory Visit: Payer: 59 | Admitting: Family Medicine

## 2014-10-20 ENCOUNTER — Encounter: Payer: Self-pay | Admitting: Family Medicine

## 2014-10-20 ENCOUNTER — Ambulatory Visit (INDEPENDENT_AMBULATORY_CARE_PROVIDER_SITE_OTHER): Payer: 59 | Admitting: Family Medicine

## 2014-10-20 VITALS — BP 110/70 | HR 98 | Temp 98.6°F | Wt 205.5 lb

## 2014-10-20 DIAGNOSIS — J0100 Acute maxillary sinusitis, unspecified: Secondary | ICD-10-CM

## 2014-10-20 MED ORDER — AMOXICILLIN-POT CLAVULANATE 875-125 MG PO TABS: 1.0000 | ORAL_TABLET | Freq: Two times a day (BID) | ORAL | Status: DC

## 2014-10-20 MED ORDER — ALBUTEROL SULFATE HFA 108 (90 BASE) MCG/ACT IN AERS: 1.0000 | INHALATION_SPRAY | Freq: Four times a day (QID) | RESPIRATORY_TRACT | Status: DC | PRN

## 2014-10-20 MED ORDER — BENZONATATE 200 MG PO CAPS: 200.0000 mg | ORAL_CAPSULE | Freq: Three times a day (TID) | ORAL | Status: DC | PRN

## 2014-10-20 NOTE — Patient Instructions (Signed)
Tessalon and albuterol for cough.  Start augmentin today.  Drink plenty of fluids, take tylenol as needed, and gargle with warm salt water for your throat.  This should gradually improve.  Take care.  Let us know if you have other concerns.   Out of work today and tomorrow.  Go back when the cough is improved.

## 2014-10-20 NOTE — Progress Notes (Signed)
Pre visit review using our clinic review tool, if applicable. No additional management support is needed unless otherwise documented below in the visit note.  Sick for about 1.5 weeks.  Sent home from work today.  Started with ST and postnasal gtt, throat congestion.  Voice is altered.  Greenish sputum/discharge coughed up.  Coughing episodes, to the point of gagging.  She can get SOB.  No fevers.  No vomiting.  No ear pain but ears feel full, likely her head is in a tunnel.  No facial pain.  Some rhinorrhea, clear.  Sick contacts.  Had a flu shot.   Meds, vitals, and allergies reviewed.   ROS: See HPI.  Otherwise, noncontributory.  GEN: nad, alert and oriented HEENT: mucous membranes moist, tm w/o erythema, nasal exam w/o erythema, clear discharge noted,  OP with cobblestoning, max sinuses ttp B NECK: supple w/o LA CV: rrr.   PULM: ctab, no inc wob EXT: no edema SKIN: no acute rash

## 2014-10-21 DIAGNOSIS — J0100 Acute maxillary sinusitis, unspecified: Secondary | ICD-10-CM | POA: Insufficient documentation

## 2014-10-21 NOTE — Assessment & Plan Note (Addendum)
Given the duration and max sinus tenderness, would treat.  Start augmentin, f/u prn.  Supportive care o/w.  She agrees.  Nontoxic.  Use tessalon and SABA prn cough.

## 2015-01-06 ENCOUNTER — Ambulatory Visit: Payer: 59 | Admitting: Family Medicine

## 2015-01-06 ENCOUNTER — Encounter: Payer: Self-pay | Admitting: Family Medicine

## 2015-01-06 ENCOUNTER — Ambulatory Visit (INDEPENDENT_AMBULATORY_CARE_PROVIDER_SITE_OTHER): Payer: 59 | Admitting: Family Medicine

## 2015-01-06 VITALS — BP 100/60 | HR 92 | Temp 98.7°F | Ht 62.0 in | Wt 206.5 lb

## 2015-01-06 DIAGNOSIS — B49 Unspecified mycosis: Secondary | ICD-10-CM | POA: Diagnosis not present

## 2015-01-06 DIAGNOSIS — H6240 Otitis externa in other diseases classified elsewhere, unspecified ear: Principal | ICD-10-CM

## 2015-01-06 DIAGNOSIS — B369 Superficial mycosis, unspecified: Secondary | ICD-10-CM

## 2015-01-06 MED ORDER — ACETIC ACID 2 % OT SOLN: 4.0000 [drp] | Freq: Three times a day (TID) | OTIC | Status: DC

## 2015-01-06 NOTE — Progress Notes (Signed)
Pre visit review using our clinic review tool, if applicable. No additional management support is needed unless otherwise documented below in the visit note. 

## 2015-01-06 NOTE — Progress Notes (Signed)
Patient ID: Cassidy Gibson MRN: [id number], DOB: 06-22-1971, 44 y.o. Date of Encounter: 01/06/2015  Primary Physician:  Arnette Norris, MD   Chief Complaint:  Chief Complaint  Patient presents with  . Ear Issues    Burn & Itch   Subjective:   History of Present Illness:  Cassidy Gibson is a 44 y.o. pleasant patient who presents with the following:  Lab Results  Component Value Date   HGBA1C 6.2 04/19/2014    Fungal OE: very pleasant lady who is been having some burning and itching in both of her ears for some time.  It is worsened within the last week.  She has had some discharge, and external itching and burning.  She has not had any deep pain, and she also has not had any kind of fever.  She denies sinus pain.  The PMH, PSH, Social History, Family History, Medications, and allergies have been reviewed in [facility], and have been updated if relevant.  Review of Systems:  GEN: No acute illnesses, no fevers, chills. GI: No n/v/d, eating normally Pulm: No SOB Interactive and getting along well at home.  Otherwise, ROS is as per the HPI.  Objective:   Physical Examination: BP 100/60 mmHg  Pulse 92  Temp(Src) 98.7 F (37.1 C) (Oral)  Ht 5\' 2"  (1.575 m)  Wt 206 lb 8 oz (93.668 kg)  BMI 37.76 kg/m2   GEN: WDWN, NAD, Non-toxic, A & O x 3 HEENT: Atraumatic, Normocephalic. Neck supple. No masses, No LAD. Ears and Nose: No external deformity. Both canals appear somewhat swollen with some whitish material in the canal CV: RRR, No M/G/R. No JVD. No thrill. No extra heart sounds. PULM: CTA B, no wheezes, crackles, rhonchi. No retractions. No resp. distress. No accessory muscle use. EXTR: No c/c/e NEURO Normal gait.  PSYCH: Normally interactive. Conversant. Not depressed or anxious appearing.  Calm demeanor.   Laboratory and Imaging Data:  Assessment & Plan:   Fungal otitis externa  Clinical parents is most consistent with fungal otitis externa.  We will treat as such.  She  is going to call if her symptoms do not clear up.  Follow-up: No Follow-up on file.  New Prescriptions   ACETIC ACID (VOSOL) 2 % OTIC SOLUTION    Place 4 drops into both ears 3 (three) times daily.   No orders of the defined types were placed in this encounter.    Signed,  Cassidy Deed. Dakin Madani, MD   Patient's Medications  New Prescriptions   ACETIC ACID (VOSOL) 2 % OTIC SOLUTION    Place 4 drops into both ears 3 (three) times daily.  Previous Medications   ALCOHOL SWABS (ALCOHOL WIPES) PADS    1 application by Does not apply route 3 (three) times daily.   GLUCOSE BLOOD (ONE TOUCH TEST STRIPS) TEST STRIP    Use as instructed   ONETOUCH DELICA LANCETS MISC    1 application by Does not apply route 2 (two) times daily.   TRIAMCINOLONE (NASACORT) 55 MCG/ACT NASAL INHALER    Place 2 sprays into the nose daily.  Modified Medications   No medications on file  Discontinued Medications   ALBUTEROL (PROVENTIL HFA;VENTOLIN HFA) 108 (90 BASE) MCG/ACT INHALER    Inhale 1-2 puffs into the lungs every 6 (six) hours as needed (cough).   AMOXICILLIN-CLAVULANATE (AUGMENTIN) 875-125 MG PER TABLET    Take 1 tablet by mouth 2 (two) times daily.   BENZONATATE (TESSALON) 200 MG CAPSULE    Take  1 capsule (200 mg total) by mouth 3 (three) times daily as needed.   IBUPROFEN (ADVIL,MOTRIN) 800 MG TABLET    Take 800 mg by mouth every 8 (eight) hours as needed.     OLOPATADINE HCL (PATADAY) 0.2 % SOLN    One drop in each eye daily

## 2015-02-21 ENCOUNTER — Other Ambulatory Visit: Payer: Self-pay

## 2015-08-12 ENCOUNTER — Other Ambulatory Visit: Payer: Self-pay | Admitting: Internal Medicine

## 2015-08-12 ENCOUNTER — Other Ambulatory Visit (INDEPENDENT_AMBULATORY_CARE_PROVIDER_SITE_OTHER): Payer: Managed Care, Other (non HMO)

## 2015-08-12 ENCOUNTER — Other Ambulatory Visit: Payer: Self-pay | Admitting: Family Medicine

## 2015-08-12 DIAGNOSIS — Z0000 Encounter for general adult medical examination without abnormal findings: Secondary | ICD-10-CM

## 2015-08-12 LAB — CBC
HCT: 44.7 % (ref 36.0–46.0)
Hemoglobin: 14.7 g/dL (ref 12.0–15.0)
MCHC: 32.9 g/dL (ref 30.0–36.0)
MCV: 91 fl (ref 78.0–100.0)
Platelets: 285 K/uL (ref 150.0–400.0)
RBC: 4.91 Mil/uL (ref 3.87–5.11)
RDW: 13.3 % (ref 11.5–15.5)
WBC: 9.2 K/uL (ref 4.0–10.5)

## 2015-08-12 LAB — COMPREHENSIVE METABOLIC PANEL
ALK PHOS: 66 U/L (ref 39–117)
ALT: 55 U/L — ABNORMAL HIGH (ref 0–35)
AST: 38 U/L — ABNORMAL HIGH (ref 0–37)
Albumin: 4.1 g/dL (ref 3.5–5.2)
BUN: 13 mg/dL (ref 6–23)
CALCIUM: 9.4 mg/dL (ref 8.4–10.5)
CO2: 29 meq/L (ref 19–32)
Chloride: 101 mEq/L (ref 96–112)
Creatinine, Ser: 0.73 mg/dL (ref 0.40–1.20)
GFR: 92.19 mL/min (ref >60.00)
Glucose, Bld: 120 mg/dL — ABNORMAL HIGH (ref 70–99)
Potassium: 4.2 mEq/L (ref 3.5–5.1)
Sodium: 138 mEq/L (ref 135–145)
Total Bilirubin: 0.5 mg/dL (ref 0.2–1.2)
Total Protein: 7.2 g/dL (ref 6.0–8.3)

## 2015-08-12 LAB — MICROALBUMIN / CREATININE URINE RATIO
Creatinine,U: 177 mg/dL
Microalb Creat Ratio: 0.4 mg/g (ref 0.0–30.0)
Microalb, Ur: 0.7 mg/dL (ref 0.0–1.9)

## 2015-08-12 LAB — LIPID PANEL
CHOL/HDL RATIO: 3
Cholesterol: 161 mg/dL (ref 0–200)
HDL: 55 mg/dL (ref >39.00)
LDL Cholesterol: 92 mg/dL (ref 0–99)
NonHDL: 106.1
TRIGLYCERIDES: 69 mg/dL (ref 0.0–149.0)
VLDL: 13.8 mg/dL (ref 0.0–40.0)

## 2015-08-12 LAB — HEMOGLOBIN A1C: Hgb A1c MFr Bld: 6.5 % (ref 4.6–6.5)

## 2015-08-12 NOTE — Addendum Note (Signed)
Addended by: Ellamae Sia on: 08/12/2015 02:07 PM   Modules accepted: Orders

## 2015-08-16 ENCOUNTER — Encounter: Payer: Self-pay | Admitting: Family Medicine

## 2015-08-16 ENCOUNTER — Ambulatory Visit (INDEPENDENT_AMBULATORY_CARE_PROVIDER_SITE_OTHER): Payer: Managed Care, Other (non HMO) | Admitting: Family Medicine

## 2015-08-16 VITALS — BP 122/78 | HR 88 | Temp 97.9°F | Ht 62.0 in | Wt 207.0 lb

## 2015-08-16 DIAGNOSIS — Z23 Encounter for immunization: Secondary | ICD-10-CM | POA: Diagnosis not present

## 2015-08-16 DIAGNOSIS — Z0000 Encounter for general adult medical examination without abnormal findings: Secondary | ICD-10-CM

## 2015-08-16 DIAGNOSIS — E119 Type 2 diabetes mellitus without complications: Secondary | ICD-10-CM

## 2015-08-16 DIAGNOSIS — Z01419 Encounter for gynecological examination (general) (routine) without abnormal findings: Secondary | ICD-10-CM | POA: Insufficient documentation

## 2015-08-16 NOTE — Patient Instructions (Signed)
Wonderful to see you. Merry Christmas!

## 2015-08-16 NOTE — Progress Notes (Signed)
Pre visit review using our clinic review tool, if applicable. No additional management support is needed unless otherwise documented below in the visit note. 

## 2015-08-16 NOTE — Assessment & Plan Note (Signed)
Deteriorated. Motivated to control with diet- will cut out sugary drink and recheck a1c abroad.

## 2015-08-16 NOTE — Assessment & Plan Note (Signed)
Reviewed preventive care protocols, scheduled due services, and updated immunizations Discussed nutrition, exercise, diet, and healthy lifestyle.  Influenza vaccine today.  

## 2015-08-16 NOTE — Progress Notes (Signed)
Subjective:   Patient ID: Cassidy Gibson, female    DOB: 04/12/1971, 44 y.o.   MRN: [id number]  Cassidy Gibson is a pleasant 44 y.o. year old female who presents to clinic today with Annual Exam  on 08/16/2015  HPI:  Doing well.  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  G1P1- sees Dr. Orvan Seen. Has IUD. Mammogram was done in June in [place].  Diabetes- deteriorated.  a1c now 6.5.  Has not been taking Metformin. Has been drinking regular cokes in [place]. Lab Results  Component Value Date   HGBA1C 6.5 08/12/2015   Lab Results  Component Value Date   CHOL 161 08/12/2015   HDL 55.00 08/12/2015   LDLCALC 92 08/12/2015   TRIG 69.0 08/12/2015   CHOLHDL 3 08/12/2015   Lab Results  Component Value Date   CREATININE 0.73 08/12/2015   Lab Results  Component Value Date   ALT 55* 08/12/2015   AST 38* 08/12/2015   ALKPHOS 66 08/12/2015   BILITOT 0.5 08/12/2015   No results found for: TSH    Current Outpatient Prescriptions on File Prior to Visit  Medication Sig Dispense Refill  . triamcinolone (NASACORT) 55 MCG/ACT nasal inhaler Place 2 sprays into the nose 2 (two) times daily as needed.     . Alcohol Swabs (ALCOHOL WIPES) PADS 1 application by Does not apply route 3 (three) times daily. (Patient not taking: Reported on 08/16/2015) 90 each 3  . glucose blood (ONE TOUCH TEST STRIPS) test strip Use as instructed (Patient not taking: Reported on 08/16/2015) 100 each 12  . ONETOUCH DELICA LANCETS MISC 1 application by Does not apply route 2 (two) times daily. (Patient not taking: Reported on 08/16/2015) 100 each 2   No current facility-administered medications on file prior to visit.    No Known Allergies  Past Medical History  Diagnosis Date  . Allergy   . Prediabetes     Past Surgical History  Procedure Laterality Date  . Cesarean section  2001  . Adenoidectomy  1978  . Breast reduction surgery  1998  . Tympanostomy tube placement   1979    Family History  Problem Relation Age of Onset  . Breast cancer Mother   . GER disease Mother   . Cancer Mother     Breast  . Diabetes Father   . Hypertension Father   . Hyperlipidemia Brother   . Hypertension Brother   . Heart disease Brother   . Diabetes Maternal Aunt   . Diabetes Maternal Uncle   . Diabetes Paternal Aunt   . Diabetes Paternal Uncle   . Diabetes Maternal Grandmother   . Diabetes Paternal Grandmother   . Heart disease Paternal Grandfather     Social History   Social History  . Marital Status: Married    Spouse Name: N/A  . Number of Children: N/A  . Years of Education: N/A   Occupational History  . Not on file.   Social History Main Topics  . Smoking status: Never Smoker   . Smokeless tobacco: Never Used  . Alcohol Use: No  . Drug Use: No  . Sexual Activity: Yes   Other Topics Concern  . Not on file   Social History Narrative   The PMH, PSH, Social History, Family History, Medications, and allergies have been reviewed in [facility], and have been updated if relevant.   Review of Systems  Constitutional: Negative.   HENT: Negative.  Eyes: Negative.   Respiratory: Negative.   Cardiovascular: Negative.   Gastrointestinal: Negative.   Endocrine: Negative.   Genitourinary: Negative.   Musculoskeletal: Negative.   Skin: Negative.   Allergic/Immunologic: Negative.   Neurological: Negative.   Hematological: Negative.   Psychiatric/Behavioral: Negative.   All other systems reviewed and are negative.      Objective:    BP 122/78 mmHg  Pulse 88  Temp(Src) 97.9 F (36.6 C) (Oral)  Ht 5\' 2"  (1.575 m)  Wt 207 lb (93.895 kg)  BMI 37.85 kg/m2  Wt Readings from Last 3 Encounters:  08/16/15 207 lb (93.895 kg)  01/06/15 206 lb 8 oz (93.668 kg)  10/20/14 205 lb 8 oz (93.214 kg)    Physical Exam   General:  Well-developed,well-nourished,in no acute distress; alert,appropriate and cooperative throughout examination Head:   normocephalic and atraumatic.   Eyes:  vision grossly intact, pupils equal, pupils round, and pupils reactive to light.   Ears:  R ear normal and L ear normal.   Nose:  no external deformity.   Mouth:  good dentition.   Neck:  No deformities, masses, or tenderness noted. Breasts:  No mass, nodules, thickening, tenderness, bulging, retraction, inflamation, nipple discharge or skin changes noted.   Lungs:  Normal respiratory effort, chest expands symmetrically. Lungs are clear to auscultation, no crackles or wheezes. Heart:  Normal rate and regular rhythm. S1 and S2 normal without gallop, murmur, click, rub or other extra sounds. Abdomen:  Bowel sounds positive,abdomen soft and non-tender without masses, organomegaly or hernias noted. Msk:  No deformity or scoliosis noted of thoracic or lumbar spine.   Extremities:  No clubbing, cyanosis, edema, or deformity noted with normal full range of motion of all joints.   Neurologic:  alert & oriented X3 and gait normal.   Skin:  Intact without suspicious lesions or rashes Cervical Nodes:  No lymphadenopathy noted Axillary Nodes:  No palpable lymphadenopathy Psych:  Cognition and judgment appear intact. Alert and cooperative with normal attention span and concentration. No apparent delusions, illusions, hallucinations       Assessment & Plan:   Well woman exam  Diabetes mellitus, new onset ([place])  Need for influenza vaccination - Plan: Flu Vaccine QUAD 36+ mos PF IM (Fluarix & Fluzone Quad PF) No Follow-up on file.

## 2015-08-17 ENCOUNTER — Other Ambulatory Visit: Payer: Self-pay

## 2015-08-17 MED ORDER — ALCOHOL WIPES PADS: MEDICATED_PAD | Status: DC

## 2015-08-17 MED ORDER — ONETOUCH DELICA LANCETS 33G MISC: Status: DC

## 2015-08-17 MED ORDER — GLUCOSE BLOOD VI STRP: ORAL_STRIP | Status: DC

## 2015-08-17 NOTE — Telephone Encounter (Signed)
Pt request 90 day refill to [facility] for diabetic supplies while pt is in town; living in [place] now. Pt seen 08/16/15. Refill done per protocol. Pt notified done.

## 2016-02-27 ENCOUNTER — Telehealth: Payer: Self-pay

## 2016-02-27 MED ORDER — FLUCONAZOLE 150 MG PO TABS: 150.0000 mg | ORAL_TABLET | Freq: Once | ORAL | Status: AC

## 2016-02-27 NOTE — Telephone Encounter (Signed)
eRx sent and please wish her a safe trip. Make sure she is seen in [place] if symptoms persist.

## 2016-02-27 NOTE — Telephone Encounter (Signed)
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 Pt going back to [place] on 02/28/16; pt request cb.

## 2017-01-28 ENCOUNTER — Encounter: Payer: Self-pay | Admitting: Family Medicine

## 2017-01-28 ENCOUNTER — Telehealth: Payer: Self-pay | Admitting: Family Medicine

## 2017-01-28 NOTE — Telephone Encounter (Signed)
Pt needs a letter stating her Blood type for her [facility].  Please advise if we can provide for her and her husband and daughter Addison.  Best number to call is [phone]

## 2017-01-28 NOTE — Telephone Encounter (Signed)
She and Addison are both A positive.  Do I see her husband as well?  What is his name?

## 2017-10-28 ENCOUNTER — Ambulatory Visit (INDEPENDENT_AMBULATORY_CARE_PROVIDER_SITE_OTHER): Payer: Managed Care, Other (non HMO) | Admitting: Family Medicine

## 2017-10-28 ENCOUNTER — Encounter: Payer: Self-pay | Admitting: Family Medicine

## 2017-10-28 VITALS — BP 108/74 | HR 78 | Temp 99.4°F | Ht 62.25 in | Wt 196.8 lb

## 2017-10-28 DIAGNOSIS — E119 Type 2 diabetes mellitus without complications: Secondary | ICD-10-CM | POA: Diagnosis not present

## 2017-10-28 DIAGNOSIS — Z0000 Encounter for general adult medical examination without abnormal findings: Secondary | ICD-10-CM

## 2017-10-28 LAB — COMPREHENSIVE METABOLIC PANEL
ALBUMIN: 3.8 g/dL (ref 3.5–5.2)
ALK PHOS: 74 U/L (ref 39–117)
ALT: 26 U/L (ref 0–35)
AST: 17 U/L (ref 0–37)
BUN: 11 mg/dL (ref 6–23)
CO2: 26 mEq/L (ref 19–32)
Calcium: 9.2 mg/dL (ref 8.4–10.5)
Chloride: 105 mEq/L (ref 96–112)
Creatinine, Ser: 0.67 mg/dL (ref 0.40–1.20)
GFR: 100.76 mL/min (ref >60.00)
GLUCOSE: 110 mg/dL — AB (ref 70–99)
POTASSIUM: 5 meq/L (ref 3.5–5.1)
SODIUM: 141 meq/L (ref 135–145)
TOTAL PROTEIN: 6.7 g/dL (ref 6.0–8.3)
Total Bilirubin: 0.4 mg/dL (ref 0.2–1.2)

## 2017-10-28 LAB — LIPID PANEL
Cholesterol: 169 mg/dL (ref 0–200)
HDL: 58.5 mg/dL (ref >39.00)
LDL Cholesterol: 94 mg/dL (ref 0–99)
NONHDL: 110.63
Total CHOL/HDL Ratio: 3
Triglycerides: 83 mg/dL (ref 0.0–149.0)
VLDL: 16.6 mg/dL (ref 0.0–40.0)

## 2017-10-28 LAB — HEMOGLOBIN A1C: Hgb A1c MFr Bld: 6.2 % (ref 4.6–6.5)

## 2017-10-28 LAB — TSH: TSH: 1.26 u[IU]/mL (ref 0.35–4.50)

## 2017-10-28 NOTE — Assessment & Plan Note (Signed)
Currently diet controlled. Due for labs today. Orders Placed This Encounter  Procedures  . Comprehensive metabolic panel  . Lipid panel  . TSH  . Hemoglobin A1c

## 2017-10-28 NOTE — Patient Instructions (Signed)
Great to see you. I will call you with your lab results from today and you can view them online.   

## 2017-10-28 NOTE — Progress Notes (Signed)
Subjective:   Patient ID: Cassidy Gibson, female    DOB: [birth year], 47 y.o.   MRN: [id number]  Cassidy Gibson is a pleasant 47 y.o. year old female who presents to clinic today with Annual Exam (Patient is here today for a CPE without PAP.  She is currently fasting.  Her PAP's are done at [facility] last being done in December and WNL.  Had mole removed on neck which had to be re-excised for questionable cells.  She states that she had Tdap done 3 years ago and will get that date for [place].)  on 10/28/2017  HPI:  Doing well.  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  Health Maintenance  Topic Date Due  . PNEUMOCOCCAL POLYSACCHARIDE VACCINE (1) 12/07/1973  . OPHTHALMOLOGY EXAM  12/07/1981  . HIV Screening  12/08/1986  . HEMOGLOBIN A1C  02/10/2016  . URINE MICROALBUMIN  08/11/2016  . FOOT EXAM  08/15/2016  . TETANUS/TDAP  11/28/2017 (Originally 01/25/2014)  . INFLUENZA VACCINE  06/30/2018 (Originally 03/27/2017)  . PAP SMEAR  08/17/2020    G1P1- sees Dr. Orvan Seen- per pt, last pap smear 07/2017 and was normal. Has IUD.   Diabetes- due for labs.  Not currently taking Metformin.  Denies increased thirst or urination. Lab Results  Component Value Date   HGBA1C 6.5 08/12/2015   Lab Results  Component Value Date   CHOL 161 08/12/2015   HDL 55.00 08/12/2015   LDLCALC 92 08/12/2015   TRIG 69.0 08/12/2015   CHOLHDL 3 08/12/2015   Lab Results  Component Value Date   CREATININE 0.73 08/12/2015   Lab Results  Component Value Date   ALT 55 (H) 08/12/2015   AST 38 (H) 08/12/2015   ALKPHOS 66 08/12/2015   BILITOT 0.5 08/12/2015   No results found for: TSH    Current Outpatient Medications on File Prior to Visit  Medication Sig Dispense Refill  . Alcohol Swabs (ALCOHOL WIPES) PADS Test blood sugar once daily and as directed. Dx E11.9 90 each 0  . glucose blood (ONE TOUCH TEST STRIPS) test strip Test blood sugar once daily and as directed. Dx  E11.9 100 each 0  . ONETOUCH DELICA LANCETS 82X MISC Test blood sugar once daily and as directed. Dx E11.9 100 each 0  . triamcinolone (NASACORT) 55 MCG/ACT nasal inhaler Place 2 sprays into the nose 2 (two) times daily as needed.      No current facility-administered medications on file prior to visit.     No Known Allergies  Past Medical History:  Diagnosis Date  . Allergy   . Prediabetes     Past Surgical History:  Procedure Laterality Date  . ADENOIDECTOMY  1978  . BREAST REDUCTION SURGERY  1998  . CESAREAN SECTION  2001  . TYMPANOSTOMY TUBE PLACEMENT  1979    Family History  Problem Relation Age of Onset  . Breast cancer Mother   . GER disease Mother   . Cancer Mother        Breast  . Diabetes Father   . Hypertension Father   . Hyperlipidemia Brother   . Hypertension Brother   . Heart disease Brother   . Diabetes Maternal Aunt   . Diabetes Maternal Uncle   . Diabetes Paternal Aunt   . Diabetes Paternal Uncle   . Diabetes Maternal Grandmother   . Diabetes Paternal Grandmother   . Heart disease Paternal Grandfather     Social History  Socioeconomic History  . Marital status: Married    Spouse name: Not on file  . Number of children: Not on file  . Years of education: Not on file  . Highest education level: Not on file  Social Needs  . Financial resource strain: Not on file  . Food insecurity - worry: Not on file  . Food insecurity - inability: Not on file  . Transportation needs - medical: Not on file  . Transportation needs - non-medical: Not on file  Occupational History  . Not on file  Tobacco Use  . Smoking status: Never Smoker  . Smokeless tobacco: Never Used  Substance and Sexual Activity  . Alcohol use: No    Alcohol/week: 0.0 oz  . Drug use: No  . Sexual activity: Yes  Other Topics Concern  . Not on file  Social History Narrative  . Not on file   The PMH, PSH, Social History, Family History, Medications, and allergies have been  reviewed in [facility], and have been updated if relevant.   Review of Systems  Constitutional: Negative.   HENT: Negative.   Eyes: Negative.   Respiratory: Negative.   Cardiovascular: Negative.   Gastrointestinal: Negative.   Endocrine: Negative.   Genitourinary: Negative.   Musculoskeletal: Negative.   Skin: Negative.   Allergic/Immunologic: Negative.   Neurological: Negative.   Hematological: Negative.   Psychiatric/Behavioral: Negative.   All other systems reviewed and are negative.      Objective:    BP 108/74 (BP Location: Left Arm, Patient Position: Sitting, Cuff Size: Normal)   Pulse 78   Temp 99.4 F (37.4 C) (Oral)   Ht 5' 2.25" (1.581 m)   Wt 196 lb 12.8 oz (89.3 kg)   SpO2 97%   BMI 35.71 kg/m   Wt Readings from Last 3 Encounters:  10/28/17 196 lb 12.8 oz (89.3 kg)  08/16/15 207 lb (93.9 kg)  01/06/15 206 lb 8 oz (93.7 kg)    Physical Exam   General:  Well-developed,well-nourished,in no acute distress; alert,appropriate and cooperative throughout examination Head:  normocephalic and atraumatic.   Eyes:  vision grossly intact, PERRL Ears:  R ear normal and L ear normal externally, TMs clear bilaterally Nose:  no external deformity.   Mouth:  good dentition.   Neck:  No deformities, masses, or tenderness noted. Breasts:  No mass, nodules, thickening, tenderness, bulging, retraction, inflamation, nipple discharge or skin changes noted.   Lungs:  Normal respiratory effort, chest expands symmetrically. Lungs are clear to auscultation, no crackles or wheezes. Heart:  Normal rate and regular rhythm. S1 and S2 normal without gallop, murmur, click, rub or other extra sounds. Abdomen:  Bowel sounds positive,abdomen soft and non-tender without masses, organomegaly or hernias noted. Msk:  No deformity or scoliosis noted of thoracic or lumbar spine.   Extremities:  No clubbing, cyanosis, edema, or deformity noted with normal full range of motion of all joints.     Neurologic:  alert & oriented X3 and gait normal.   Skin:  Intact without suspicious lesions or rashes Cervical Nodes:  No lymphadenopathy noted Axillary Nodes:  No palpable lymphadenopathy Psych:  Cognition and judgment appear intact. Alert and cooperative with normal attention span and concentration. No apparent delusions, illusions, hallucinations       Assessment & Plan:   Well woman exam without gynecological exam  Diabetes mellitus, new onset ([place]) - Plan: Comprehensive metabolic panel, Lipid panel, TSH, Hemoglobin A1c No Follow-up on file.

## 2017-10-28 NOTE — Assessment & Plan Note (Signed)
Reviewed preventive care protocols, scheduled due services, and updated immunizations Discussed nutrition, exercise, diet, and healthy lifestyle.  

## 2018-12-16 ENCOUNTER — Telehealth: Payer: Self-pay | Admitting: Family Medicine

## 2018-12-16 NOTE — Telephone Encounter (Signed)
Called and talked with patient. Patient states that she will call back later in the year to schedule CPE.

## 2018-12-17 ENCOUNTER — Ambulatory Visit (INDEPENDENT_AMBULATORY_CARE_PROVIDER_SITE_OTHER): Payer: 59 | Admitting: Podiatry

## 2018-12-17 ENCOUNTER — Other Ambulatory Visit: Payer: Self-pay | Admitting: Podiatry

## 2018-12-17 ENCOUNTER — Other Ambulatory Visit: Payer: Self-pay

## 2018-12-17 ENCOUNTER — Encounter: Payer: Self-pay | Admitting: Podiatry

## 2018-12-17 ENCOUNTER — Ambulatory Visit (INDEPENDENT_AMBULATORY_CARE_PROVIDER_SITE_OTHER): Payer: 59

## 2018-12-17 VITALS — BP 112/73 | HR 84 | Temp 97.5°F

## 2018-12-17 DIAGNOSIS — M79671 Pain in right foot: Secondary | ICD-10-CM | POA: Diagnosis not present

## 2018-12-17 DIAGNOSIS — M79672 Pain in left foot: Secondary | ICD-10-CM

## 2018-12-17 DIAGNOSIS — M722 Plantar fascial fibromatosis: Secondary | ICD-10-CM | POA: Diagnosis not present

## 2018-12-17 DIAGNOSIS — M659 Synovitis and tenosynovitis, unspecified: Secondary | ICD-10-CM

## 2018-12-17 MED ORDER — MELOXICAM 15 MG PO TABS: 15.0000 mg | ORAL_TABLET | Freq: Every day | ORAL | 1 refills | Status: DC

## 2018-12-17 MED ORDER — METHYLPREDNISOLONE 4 MG PO TBPK: ORAL_TABLET | ORAL | 0 refills | Status: DC

## 2018-12-17 NOTE — Progress Notes (Signed)
   Subjective: 48 y.o. female presents to the office today for bilateral foot and ankle pain.  Left worse than the right.  Pain is been ongoing for several months now however it was significantly exacerbated with a new pair of shoes that she got in March.  Over the past month she is noticed increased pain and tenderness specifically to the left heel.  She has been using ice, stretching, and Tylenol to alleviate symptoms improvement  Past Medical History:  Diagnosis Date  . Allergy   . Prediabetes    Objective: Physical Exam General: The patient is alert and oriented x3 in no acute distress.  Dermatology: Skin is warm, dry and supple bilateral lower extremities. Negative for open lesions or macerations bilateral.   Vascular: Dorsalis Pedis and Posterior Tibial pulses palpable bilateral.  Capillary fill time is immediate to all digits.  Neurological: Epicritic and protective threshold intact bilateral.   Musculoskeletal: Tenderness to palpation to the plantar aspect of the left heel along the plantar fascia. All other joints range of motion within normal limits bilateral. Strength 5/5 in all groups bilateral.   Radiographic exam: Normal osseous mineralization. Joint spaces preserved. No fracture/dislocation/boney destruction. No other soft tissue abnormalities or radiopaque foreign bodies.   Assessment: 1. Plantar fasciitis left foot  Plan of Care:  1. Patient evaluated. Xrays reviewed.   2. Injection of 0.5cc Celestone soluspan injected into the left plantar fascia.  3. Rx for Medrol Dose Pak placed 4. Rx for Meloxicam ordered for patient. 5. Plantar fascial band(s) dispensed  6.  Night splint dispensed  7.  Instructed patient regarding therapies and modalities at home to alleviate symptoms.  8. Return to clinic in 4 weeks.    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 Edrick Kins, DPM    [address], [place] [address]                Office [phone]  Fax [phone]

## 2018-12-17 NOTE — Patient Instructions (Signed)

## 2019-01-14 ENCOUNTER — Encounter: Payer: Self-pay | Admitting: Podiatry

## 2019-01-14 ENCOUNTER — Ambulatory Visit (INDEPENDENT_AMBULATORY_CARE_PROVIDER_SITE_OTHER): Payer: 59 | Admitting: Podiatry

## 2019-01-14 ENCOUNTER — Other Ambulatory Visit: Payer: Self-pay

## 2019-01-14 VITALS — Temp 98.1°F

## 2019-01-14 DIAGNOSIS — M722 Plantar fascial fibromatosis: Secondary | ICD-10-CM

## 2019-01-18 NOTE — Progress Notes (Signed)
   Subjective: 48 y.o. female presents to the office today for follow up evaluation of plantar fasciitis of the left foot. She states the pain has improved significantly but has not resolved completely. She has been taking Meloxicam daily as directed. Being on her feet for long periods of time increases the pain. Patient is here for further evaluation and treatment.   Past Medical History:  Diagnosis Date  . Allergy   . Prediabetes    Objective: Physical Exam General: The patient is alert and oriented x3 in no acute distress.  Dermatology: Skin is warm, dry and supple bilateral lower extremities. Negative for open lesions or macerations bilateral.   Vascular: Dorsalis Pedis and Posterior Tibial pulses palpable bilateral.  Capillary fill time is immediate to all digits.  Neurological: Epicritic and protective threshold intact bilateral.   Musculoskeletal: Tenderness to palpation to the plantar aspect of the left heel along the plantar fascia. All other joints range of motion within normal limits bilateral. Strength 5/5 in all groups bilateral.   Assessment: 1. Plantar fasciitis left foot - improved   Plan of Care:  1. Patient evaluated.   2. Injection of 0.5cc Celestone soluspan injected into the left plantar fascia.  3. Continue taking Meloxicam daily.  4. Recommended good shoe gear.  5. Return to clinic as needed.     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 Edrick Kins, DPM    [address], [place] [address]                Office [phone]  Fax [phone]

## 2019-06-08 ENCOUNTER — Other Ambulatory Visit: Payer: Self-pay

## 2019-06-08 ENCOUNTER — Ambulatory Visit: Payer: Managed Care, Other (non HMO) | Admitting: Podiatry

## 2019-06-08 ENCOUNTER — Ambulatory Visit (INDEPENDENT_AMBULATORY_CARE_PROVIDER_SITE_OTHER): Payer: Managed Care, Other (non HMO)

## 2019-06-08 ENCOUNTER — Other Ambulatory Visit: Payer: Self-pay | Admitting: Podiatry

## 2019-06-08 DIAGNOSIS — M79672 Pain in left foot: Secondary | ICD-10-CM

## 2019-06-08 DIAGNOSIS — S93602A Unspecified sprain of left foot, initial encounter: Secondary | ICD-10-CM | POA: Diagnosis not present

## 2019-06-08 MED ORDER — TRAMADOL HCL 50 MG PO TABS: 50.0000 mg | ORAL_TABLET | Freq: Four times a day (QID) | ORAL | 0 refills | Status: AC | PRN

## 2019-06-08 MED ORDER — MELOXICAM 15 MG PO TABS: 15.0000 mg | ORAL_TABLET | Freq: Every day | ORAL | 1 refills | Status: DC

## 2019-06-11 NOTE — Progress Notes (Signed)
   HPI: 48 y.o. female presenting today with a chief complaint of severe pain noted to the left dorsal foot that began about two weeks ago after tripping and injuring the foot. She reports associated swelling of the foot. She is concerned for a possible fracture because she is unable to bear any weight without tremendous pain. She has not had any treatment for the injury. Patient is here for further evaluation and treatment.   Past Medical History:  Diagnosis Date  . Allergy   . Prediabetes      Physical Exam: General: The patient is alert and oriented x3 in no acute distress.  Dermatology: Skin is warm, dry and supple bilateral lower extremities. Negative for open lesions or macerations.  Vascular: Palpable pedal pulses bilaterally. No edema or erythema noted. Capillary refill within normal limits.  Neurological: Epicritic and protective threshold grossly intact bilaterally.   Musculoskeletal Exam: Pain with palpation noted to the left midfoot. Range of motion within normal limits to all pedal and ankle joints bilateral. Muscle strength 5/5 in all groups bilateral.   Radiographic Exam:  Normal osseous mineralization. Joint spaces preserved. No fracture/dislocation/boney destruction.    Assessment: 1. Left midfoot sprain   Plan of Care:  1. Patient evaluated. X-Rays reviewed.  2. Prescription for Meloxicam provided to patient. 3. Prescription for Tramadol provided to patient.  4. CAM boot dispensed.  5. Ace wraps provided.  6. Return to clinic in 4 weeks.    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 Working at [employer].      Edrick Kins, DPM [facility]  Dr. Edrick Kins, DPM    [address], [place] [address]                Office [phone]  Fax [phone]

## 2019-06-18 ENCOUNTER — Encounter: Payer: Self-pay | Admitting: Podiatry

## 2019-06-19 MED FILL — DERMOTIC OIL 0.01% EAR DRP: 0.01 | 90 days supply | Qty: 20 | Fill #0

## 2019-07-06 ENCOUNTER — Ambulatory Visit: Payer: Managed Care, Other (non HMO) | Admitting: Podiatry

## 2019-09-10 MED FILL — DERMOTIC OIL 0.01% EAR DRP: 0.01 | 90 days supply | Qty: 20 | Fill #1

## 2019-12-23 ENCOUNTER — Encounter: Payer: Self-pay | Admitting: Family Medicine

## 2020-01-20 ENCOUNTER — Encounter (INDEPENDENT_AMBULATORY_CARE_PROVIDER_SITE_OTHER): Payer: Self-pay | Admitting: Otolaryngology

## 2020-01-20 ENCOUNTER — Ambulatory Visit (INDEPENDENT_AMBULATORY_CARE_PROVIDER_SITE_OTHER): Payer: Managed Care, Other (non HMO) | Admitting: Otolaryngology

## 2020-01-20 ENCOUNTER — Other Ambulatory Visit: Payer: Self-pay

## 2020-01-20 ENCOUNTER — Other Ambulatory Visit (HOSPITAL_COMMUNITY): Payer: Self-pay | Admitting: Otolaryngology

## 2020-01-20 VITALS — Temp 97.3°F

## 2020-01-20 DIAGNOSIS — H608X3 Other otitis externa, bilateral: Secondary | ICD-10-CM | POA: Diagnosis not present

## 2020-01-20 DIAGNOSIS — H6123 Impacted cerumen, bilateral: Secondary | ICD-10-CM

## 2020-01-20 MED FILL — BETAMETHASONE DP AUG 0.05%: 0.05 | 4 days supply | Qty: 15 | Fill #0

## 2020-01-20 NOTE — Progress Notes (Signed)
HPI: Cassidy Gibson is a 49 y.o. female who presents for evaluation of blockage of her hearing and itching in the ears.  She has previously been seen by Dr. Thornell Mule and was diagnosed with dermatitis in the ears.  More recently has seen a dermatologist and was tried with eardrops that were not beneficial.  When she saw the Dr. Thornell Mule several years ago she had bad ear infection and was treated with prednisone Dosepak and antibiotic eardrops. Presently the itching is more in the outer portion of the ear she is having no drainage.  Past Medical History:  Diagnosis Date  . Allergy   . Prediabetes    Past Surgical History:  Procedure Laterality Date  . ADENOIDECTOMY  1978  . BREAST REDUCTION SURGERY  1998  . CESAREAN SECTION  2001  . TYMPANOSTOMY TUBE PLACEMENT  1979   Social History   Socioeconomic History  . Marital status: Married    Spouse name: Not on file  . Number of children: Not on file  . Years of education: Not on file  . Highest education level: Not on file  Occupational History  . Not on file  Tobacco Use  . Smoking status: Never Smoker  . Smokeless tobacco: Never Used  Substance and Sexual Activity  . Alcohol use: No    Alcohol/week: 0.0 standard drinks  . Drug use: No  . Sexual activity: Yes  Other Topics Concern  . Not on file  Social History Narrative  . Not on file   Social Determinants of Health   Financial Resource Strain:   . Difficulty of Paying Living Expenses:   Food Insecurity:   . Worried About Charity fundraiser in the Last Year:   . Arboriculturist in the Last Year:   Transportation Needs:   . Film/video editor (Medical):   Marland Kitchen Lack of Transportation (Non-Medical):   Physical Activity:   . Days of Exercise per Week:   . Minutes of Exercise per Session:   Stress:   . Feeling of Stress :   Social Connections:   . Frequency of Communication with Friends and Family:   . Frequency of Social Gatherings with Friends and Family:   . Attends  Religious Services:   . Active Member of Clubs or Organizations:   . Attends Archivist Meetings:   Marland Kitchen Marital Status:    Family History  Problem Relation Age of Onset  . Breast cancer Mother   . GER disease Mother   . Cancer Mother        Breast  . Heart disease Paternal Grandfather   . Diabetes Father   . Hypertension Father   . Hyperlipidemia Brother   . Hypertension Brother   . Heart disease Brother   . Diabetes Maternal Aunt   . Diabetes Maternal Uncle   . Diabetes Paternal Aunt   . Diabetes Paternal Uncle   . Diabetes Maternal Grandmother   . Diabetes Paternal Grandmother    No Known Allergies Prior to Admission medications   Medication Sig Start Date End Date Taking? Authorizing Provider  fluticasone (FLONASE ALLERGY RELIEF) 50 MCG/ACT nasal spray Flonase Allergy Relief   Yes [provider]  Loratadine (CLARITIN) 10 MG CAPS Claritin   Yes [provider]  glucose blood (ONE TOUCH TEST STRIPS) test strip Test blood sugar once daily and as directed. Dx E11.9 08/17/15   Lucille Passy, MD  meloxicam (MOBIC) 15 MG tablet Take 1 tablet (15 mg  total) by mouth daily. 06/08/19   Edrick Kins, DPM  methylPREDNISolone (MEDROL DOSEPAK) 4 MG TBPK tablet 6 day dose pack - take as directed 12/17/18   Edrick Kins, DPM  ONETOUCH DELICA LANCETS [id number] MISC Test blood sugar once daily and as directed. Dx E11.9 08/17/15   Lucille Passy, MD     Positive ROS: Otherwise negative  All other systems have been reviewed and were otherwise negative with the exception of those mentioned in the HPI and as above.  Physical Exam: Constitutional: Alert, well-appearing, no acute distress Ears: External ears without lesions or tenderness.  She has a large amount of normal-appearing wax obstructing both ear canals that was cleaned in the office using suction.  The more medial portion of the ear canals and TMs were clear bilaterally.  She had a very mild dermatitis of the  lateral portion ear canals.  I applied gentian violet and CSF powder to both ear canals. Nasal: External nose without lesions. Clear nasal passages Oral: Lips and gums without lesions. Tongue and palate mucosa without lesions. Posterior oropharynx clear. Neck: No palpable adenopathy or masses Respiratory: Breathing comfortably  Skin: No facial/neck lesions or rash noted.  Cerumen impaction removal  Date/Time: 01/20/2020 10:56 AM Performed by: Rozetta Nunnery, MD Authorized by: Rozetta Nunnery, MD   Consent:    Consent obtained:  Verbal   Consent given by:  Patient   Risks discussed:  Pain and bleeding Procedure details:    Location:  L ear and R ear   Procedure type: curette and suction   Post-procedure details:    Inspection:  TM intact and canal normal   Hearing quality:  Improved   Patient tolerance of procedure:  Tolerated well, no immediate complications Comments:     TMs are clear bilaterally. I applied gentian violet and CSF powder to both ear canals as they have been itching.    Assessment: Bilateral cerumen buildup. Eczematoid otitis externa  Plan: I prescribed Diprolene 0.05% cream or lotion to use on the ear canals twice daily as needed itching for 3 to 4 days as needed. She will follow-up as needed.  Radene Journey, MD

## 2020-01-29 ENCOUNTER — Other Ambulatory Visit: Payer: Self-pay

## 2020-01-29 ENCOUNTER — Ambulatory Visit (INDEPENDENT_AMBULATORY_CARE_PROVIDER_SITE_OTHER): Payer: Managed Care, Other (non HMO) | Admitting: Family Medicine

## 2020-01-29 ENCOUNTER — Encounter: Payer: Self-pay | Admitting: Family Medicine

## 2020-01-29 VITALS — BP 126/74 | HR 87 | Temp 98.3°F | Ht 62.25 in | Wt 207.4 lb

## 2020-01-29 DIAGNOSIS — Z0000 Encounter for general adult medical examination without abnormal findings: Secondary | ICD-10-CM

## 2020-01-29 DIAGNOSIS — R7303 Prediabetes: Secondary | ICD-10-CM

## 2020-01-29 DIAGNOSIS — E119 Type 2 diabetes mellitus without complications: Secondary | ICD-10-CM | POA: Insufficient documentation

## 2020-01-29 NOTE — Patient Instructions (Signed)

## 2020-01-29 NOTE — Progress Notes (Signed)
Patient: Cassidy Gibson MRN: [id number] DOB: 08/10/1971 PCP: Orma Flaming, MD     Subjective:  Chief Complaint  Patient presents with  . Annual Exam  . Prediabetes    HPI: The patient is a 49 y.o. female who presents today for annual exam. She denies any changes to past medical history. There have been no recent hospitalizations. They are following a well balanced diet and exercise plan. Weight has been increasing steadily. No complaints today.   Prediabetes Long hx of prediabetes. No medication. Hx of possibly a1c of 6.5. she was on metformin for 6 months then off and has never been on this again. She thinks she was put on this in 07/2015, did not take this for long.   Immunization History  Administered Date(s) Administered  . Hepatitis A, Adult 12/26/2003  . Hepatitis B 12/26/2003  . Influenza,inj,Quad PF,6+ Mos 08/16/2015  . MMR 11/25/1976  . PFIZER SARS-COV-2 Vaccination 10/21/2019, 11/19/2019  . Td 01/26/2004  . Tdap 03/28/2007, 12/26/2014  . Varicella 11/25/1976   Colonoscopy: not had at this time.  Mammogram: 01/08/2020 Pap smear: 12/2019    Review of Systems  Constitutional: Negative for chills, fatigue and fever.  HENT: Negative for dental problem, ear pain, hearing loss and trouble swallowing.   Eyes: Negative for visual disturbance.  Respiratory: Negative for cough, chest tightness and shortness of breath.   Cardiovascular: Negative for chest pain, palpitations and leg swelling.  Gastrointestinal: Negative for abdominal pain, blood in stool, diarrhea and nausea.  Endocrine: Negative for cold intolerance, polydipsia, polyphagia and polyuria.  Genitourinary: Negative for dysuria and hematuria.  Musculoskeletal: Negative for arthralgias.  Skin: Negative for rash.  Neurological: Negative for dizziness and headaches.  Psychiatric/Behavioral: Negative for dysphoric mood and sleep disturbance. The patient is not nervous/anxious.     Allergies Patient has No  Known Allergies.  Past Medical History Patient  has a past medical history of Allergy and Prediabetes.  Surgical History Patient  has a past surgical history that includes Cesarean section (2001); Adenoidectomy (1978); Breast reduction surgery (1998); and Tympanostomy tube placement (1979).  Family History Pateint's family history includes Breast cancer in her mother; Cancer in her mother; Diabetes in her father, maternal aunt, maternal grandmother, maternal uncle, paternal aunt, paternal grandmother, and paternal uncle; GER disease in her mother; Heart disease in her brother and paternal grandfather; Hyperlipidemia in her brother; Hypertension in her brother and father.  Social History Patient  reports that she has never smoked. She has never used smokeless tobacco. She reports that she does not drink alcohol or use drugs.    Objective: Vitals:   01/29/20 1317  BP: 126/74  Pulse: 87  Temp: 98.3 F (36.8 C)  TempSrc: Temporal  SpO2: 95%  Weight: 207 lb 6.4 oz (94.1 kg)  Height: 5' 2.25" (1.581 m)    Body mass index is 37.63 kg/m.  Physical Exam Vitals reviewed.  Constitutional:      Appearance: Normal appearance. She is well-developed. She is obese.  HENT:     Head: Normocephalic and atraumatic.     Right Ear: Tympanic membrane, ear canal and external ear normal.     Left Ear: Tympanic membrane, ear canal and external ear normal.     Mouth/Throat:     Mouth: Mucous membranes are moist.  Eyes:     Extraocular Movements: Extraocular movements intact.     Conjunctiva/sclera: Conjunctivae normal.     Pupils: Pupils are equal, round, and reactive to light.  Neck:  Thyroid: No thyromegaly.     Vascular: No carotid bruit.  Cardiovascular:     Rate and Rhythm: Normal rate and regular rhythm.     Pulses: Normal pulses.     Heart sounds: Normal heart sounds. No murmur.  Pulmonary:     Effort: Pulmonary effort is normal.     Breath sounds: Normal breath sounds.   Abdominal:     General: Bowel sounds are normal. There is no distension.     Palpations: Abdomen is soft.     Tenderness: There is no abdominal tenderness.  Musculoskeletal:     Cervical back: Normal range of motion and neck supple.  Lymphadenopathy:     Cervical: No cervical adenopathy.  Skin:    General: Skin is warm and dry.     Capillary Refill: Capillary refill takes less than 2 seconds.     Findings: No rash.  Neurological:     General: No focal deficit present.     Mental Status: She is alert and oriented to person, place, and time.     Cranial Nerves: No cranial nerve deficit.     Coordination: Coordination normal.     Deep Tendon Reflexes: Reflexes normal.  Psychiatric:        Mood and Affect: Mood normal.        Behavior: Behavior normal.      Office Visit from 10/28/2017 in [facility]  PHQ-2 Total Score  0         Assessment/plan: 1. Annual physical exam Routine lab work and she will come back when she is fasting. Hm reviewed. Due for colonoscopy and she will set this up once she has her new insurance.  Really encouraged weight loss and exercise.  Patient counseling _0    Nutrition: Stressed importance of moderation in sodium/caffeine intake, saturated fat and cholesterol, caloric balance, sufficient intake of fresh fruits, vegetables, fiber, calcium, iron, and 1 mg of folate supplement per day (for females capable of pregnancy).  _1    Stressed the importance of regular exercise.   _2    Substance Abuse: Discussed cessation/primary prevention of tobacco, alcohol, or other drug use; driving or other dangerous activities under the influence; availability of treatment for abuse.   _3    Injury prevention: Discussed safety belts, safety helmets, smoke detector, smoking near bedding or upholstery.   _4    Sexuality: Discussed sexually transmitted diseases, partner selection, use of condoms, avoidance of unintended pregnancy  and contraceptive  alternatives.  _5    Dental health: Discussed importance of regular tooth brushing, flossing, and dental visits.  _6    Health maintenance and immunizations reviewed. Please refer to Health maintenance section.    - CBC with Differential/Platelet; Future - Comprehensive metabolic panel; Future - Lipid panel; Future - TSH; Future - VITAMIN D 25 Hydroxy (Vit-D Deficiency, Fractures)  2. Prediabetes Checking a1c. a1c 2 years ago was in prediabetic range. Has not been on medication in years. A1c 4 years ago was 6.5, but I do feel like she has more prediabetes. Will f/u on labs. I will write note to insurance company once I have her labs back as well. Follow every 6 months.  - Hemoglobin A1c; Future   This visit occurred during the SARS-CoV-2 public health emergency.  Safety protocols were in place, including screening questions prior to the visit, additional usage of staff PPE, and extensive cleaning of exam room while observing appropriate contact time as indicated for disinfecting solutions.     Return in about 6 months (  around 07/30/2020) for labs/prediabetes .     Orma Flaming, MD [place]  01/29/2020

## 2020-02-01 ENCOUNTER — Other Ambulatory Visit: Payer: Self-pay

## 2020-02-01 ENCOUNTER — Other Ambulatory Visit (INDEPENDENT_AMBULATORY_CARE_PROVIDER_SITE_OTHER): Payer: Managed Care, Other (non HMO)

## 2020-02-01 ENCOUNTER — Encounter: Payer: Self-pay | Admitting: Family Medicine

## 2020-02-01 ENCOUNTER — Telehealth: Payer: Self-pay | Admitting: Family Medicine

## 2020-02-01 DIAGNOSIS — R7303 Prediabetes: Secondary | ICD-10-CM

## 2020-02-01 DIAGNOSIS — Z0000 Encounter for general adult medical examination without abnormal findings: Secondary | ICD-10-CM

## 2020-02-01 LAB — CBC WITH DIFFERENTIAL/PLATELET
Basophils Absolute: 0.1 10*3/uL (ref 0.0–0.1)
Basophils Relative: 0.8 % (ref 0.0–3.0)
Eosinophils Absolute: 0.4 10*3/uL (ref 0.0–0.7)
Eosinophils Relative: 4.5 % (ref 0.0–5.0)
HCT: 42.4 % (ref 36.0–46.0)
Hemoglobin: 14.3 g/dL (ref 12.0–15.0)
Lymphocytes Relative: 29.3 % (ref 12.0–46.0)
Lymphs Abs: 2.4 10*3/uL (ref 0.7–4.0)
MCHC: 33.8 g/dL (ref 30.0–36.0)
MCV: 90.4 fl (ref 78.0–100.0)
Monocytes Absolute: 0.6 10*3/uL (ref 0.1–1.0)
Monocytes Relative: 7.2 % (ref 3.0–12.0)
Neutro Abs: 4.8 10*3/uL (ref 1.4–7.7)
Neutrophils Relative %: 58.2 % (ref 43.0–77.0)
Platelets: 257 10*3/uL (ref 150.0–400.0)
RBC: 4.69 Mil/uL (ref 3.87–5.11)
RDW: 13.2 % (ref 11.5–15.5)
WBC: 8.2 10*3/uL (ref 4.0–10.5)

## 2020-02-01 LAB — COMPREHENSIVE METABOLIC PANEL
ALT: 29 U/L (ref 0–35)
AST: 18 U/L (ref 0–37)
Albumin: 4.2 g/dL (ref 3.5–5.2)
Alkaline Phosphatase: 87 U/L (ref 39–117)
BUN: 15 mg/dL (ref 6–23)
CO2: 28 mEq/L (ref 19–32)
Calcium: 8.9 mg/dL (ref 8.4–10.5)
Chloride: 103 mEq/L (ref 96–112)
Creatinine, Ser: 0.56 mg/dL (ref 0.40–1.20)
GFR: 115.47 mL/min (ref >60.00)
Glucose, Bld: 176 mg/dL — ABNORMAL HIGH (ref 70–99)
Potassium: 4.2 mEq/L (ref 3.5–5.1)
Sodium: 137 mEq/L (ref 135–145)
Total Bilirubin: 0.5 mg/dL (ref 0.2–1.2)
Total Protein: 6.6 g/dL (ref 6.0–8.3)

## 2020-02-01 LAB — LIPID PANEL
Cholesterol: 164 mg/dL (ref 0–200)
HDL: 55 mg/dL (ref >39.00)
LDL Cholesterol: 92 mg/dL (ref 0–99)
NonHDL: 109.11
Total CHOL/HDL Ratio: 3
Triglycerides: 84 mg/dL (ref 0.0–149.0)
VLDL: 16.8 mg/dL (ref 0.0–40.0)

## 2020-02-01 LAB — TSH: TSH: 1.15 u[IU]/mL (ref 0.35–4.50)

## 2020-02-01 LAB — HEMOGLOBIN A1C: Hgb A1c MFr Bld: 7.7 % — ABNORMAL HIGH (ref 4.6–6.5)

## 2020-02-01 NOTE — Telephone Encounter (Signed)
Patient is calling to schedule follow up from lab work and asked if [facility] wanted to go ahead with starting on medication, or wait until Wednesday when she comes in the office.

## 2020-02-02 NOTE — Telephone Encounter (Signed)
Pt is scheduled for 02/03/2020. She can follow up then.

## 2020-02-03 ENCOUNTER — Other Ambulatory Visit: Payer: Self-pay | Admitting: Family Medicine

## 2020-02-03 ENCOUNTER — Encounter: Payer: Self-pay | Admitting: Family Medicine

## 2020-02-03 ENCOUNTER — Other Ambulatory Visit: Payer: Self-pay

## 2020-02-03 ENCOUNTER — Ambulatory Visit: Payer: Managed Care, Other (non HMO) | Admitting: Family Medicine

## 2020-02-03 DIAGNOSIS — E119 Type 2 diabetes mellitus without complications: Secondary | ICD-10-CM | POA: Diagnosis not present

## 2020-02-03 MED ORDER — METFORMIN HCL 1000 MG PO TABS: 1000.0000 mg | ORAL_TABLET | Freq: Two times a day (BID) | ORAL | 3 refills | Status: DC

## 2020-02-03 NOTE — Progress Notes (Signed)
Patient: Cassidy Gibson MRN: [id number] DOB: 1971-06-12 PCP: Orma Flaming, MD     Subjective:  Chief Complaint  Patient presents with  . Diabetes    New Diagnosis    HPI: The patient is a 49 y.o. female who presents today to discuss DM lab results. New diagnosis for her as she has been in prediabetes range. Does have family history of diabetes in her father and she has hx of gestational diabetes.   Diabetes Patient is here for follow up of type 2 diabetes. First diagnosed 01/2020.  Currently on the following medications-none. New diagnosis. Last A1C was 7.7. Currently not exercising and following diabetic diet.  Denies any hypoglycemic events. Denies any vision changes, nausea, vomiting, abdominal pain, ulcers/paraesthesia in feet, polyuria, polydipsia or polyphagia. Denies any chest pain, shortness of breath. She has been to diabetic educator/nutirtion.     Review of Systems  Respiratory: Negative for shortness of breath and wheezing.   Gastrointestinal: Negative for abdominal pain and nausea.  Genitourinary: Negative for frequency, pelvic pain and urgency.  Neurological: Negative for dizziness, light-headedness and headaches.    Allergies Patient has No Known Allergies.  Past Medical History Patient  has a past medical history of Allergy and Prediabetes.  Surgical History Patient  has a past surgical history that includes Cesarean section (2001); Adenoidectomy (1978); Breast reduction surgery (1998); and Tympanostomy tube placement (1979).  Family History Pateint's family history includes Breast cancer in her mother; Cancer in her mother; Diabetes in her father, maternal aunt, maternal grandmother, maternal uncle, paternal aunt, paternal grandmother, and paternal uncle; GER disease in her mother; Heart disease in her brother and paternal grandfather; Hyperlipidemia in her brother; Hypertension in her brother and father.  Social History Patient  reports that she has never  smoked. She has never used smokeless tobacco. She reports that she does not drink alcohol or use drugs.    Objective: Vitals:   02/03/20 1041  BP: 132/82  Pulse: 86  Temp: 98 F (36.7 C)  TempSrc: Temporal  SpO2: 94%  Weight: 204 lb 12.8 oz (92.9 kg)  Height: 5' 2.25" (1.581 m)    Body mass index is 37.16 kg/m.  Physical Exam Vitals reviewed.  Constitutional:      Appearance: Normal appearance. She is obese.  HENT:     Head: Normocephalic and atraumatic.  Cardiovascular:     Pulses: Normal pulses.  Pulmonary:     Effort: Pulmonary effort is normal.  Neurological:     General: No focal deficit present.     Mental Status: She is alert and oriented to person, place, and time.  Psychiatric:        Mood and Affect: Mood normal.        Behavior: Behavior normal.        Diabetic Foot Exam - Simple   Simple Foot Form Diabetic Foot exam was performed with the following findings: Yes 02/03/2020 11:15 AM  Visual Inspection Sensation Testing Pulse Check Comments Normal exam.  Orma Flaming, MD [place]       Assessment/plan: 1. Type 2 diabetes mellitus without complication, without long-term current use of insulin ([place]) Went over new diagnosis, risks, disease progression and preventation. Discussed evidence based guidelines for diabetes including statin and pneumonia shot. She would like to hold off on this at this time. Foot exam done today and wnl.  Also discussed she needs yearly eye exams which she already does and requested she send me records from this appointment when  she does go. We are going to start her on metformin with titration up to 1000mg  Bid. Discussed side effects. Also discussed diet/exericse and weight loss. Information given. She will f/u in 3 months for labs/recheck.  - Microalbumin / creatinine urine ratio    This visit occurred during the SARS-CoV-2 public health emergency.  Safety protocols were in place, including screening questions  prior to the visit, additional usage of staff PPE, and extensive cleaning of exam room while observing appropriate contact time as indicated for disinfecting solutions.    Return in about 3 months (around 05/05/2020) for diabetes.   Orma Flaming, MD [place]   02/03/2020

## 2020-02-03 NOTE — Patient Instructions (Addendum)
Going to start with metformin only. I sent in a 1000mg  pill, but cut this in half and take 500mg  twice a day for 1 week then increase to 1000mg  in the AM and 500mg  in the pm for a week and then after this 1000mg  twice a day with food.   -guidelines do state that all diabetics should be on a statin. Your numbers are good, it would be for reducing risks, not treating numbers.   -need pneumovax vaccine. Would give again at 49 years of age.   American diabetes association web site is a IT consultant.   Diabetes Mellitus and Nutrition, Adult When you have diabetes (diabetes mellitus), it is very important to have healthy eating habits because your blood sugar (glucose) levels are greatly affected by what you eat and drink. Eating healthy foods in the appropriate amounts, at about the same times every day, can help you:  Control your blood glucose.  Lower your risk of heart disease.  Improve your blood pressure.  Reach or maintain a healthy weight. Every person with diabetes is different, and each person has different needs for a meal plan. Your health care provider may recommend that you work with a diet and nutrition specialist (dietitian) to make a meal plan that is best for you. Your meal plan may vary depending on factors such as:  The calories you need.  The medicines you take.  Your weight.  Your blood glucose, blood pressure, and cholesterol levels.  Your activity level.  Other health conditions you have, such as heart or kidney disease. How do carbohydrates affect me? Carbohydrates, also called carbs, affect your blood glucose level more than any other type of food. Eating carbs naturally raises the amount of glucose in your blood. Carb counting is a method for keeping track of how many carbs you eat. Counting carbs is important to keep your blood glucose at a healthy level, especially if you use insulin or take certain oral diabetes medicines. It is important to know  how many carbs you can safely have in each meal. This is different for every person. Your dietitian can help you calculate how many carbs you should have at each meal and for each snack. Foods that contain carbs include:  Bread, cereal, rice, pasta, and crackers.  Potatoes and corn.  Peas, beans, and lentils.  Milk and yogurt.  Fruit and juice.  Desserts, such as cakes, cookies, ice cream, and candy. How does alcohol affect me? Alcohol can cause a sudden decrease in blood glucose (hypoglycemia), especially if you use insulin or take certain oral diabetes medicines. Hypoglycemia can be a life-threatening condition. Symptoms of hypoglycemia (sleepiness, dizziness, and confusion) are similar to symptoms of having too much alcohol. If your health care provider says that alcohol is safe for you, follow these guidelines:  Limit alcohol intake to no more than 1 drink per day for nonpregnant women and 2 drinks per day for men. One drink equals 12 oz of beer, 5 oz of wine, or 1 oz of hard liquor.  Do not drink on an empty stomach.  Keep yourself hydrated with water, diet soda, or unsweetened iced tea.  Keep in mind that regular soda, juice, and other mixers may contain a lot of sugar and must be counted as carbs. What are tips for following this plan?  Reading food labels  Start by checking the serving size on the "Nutrition Facts" label of packaged foods and drinks. The amount of calories, carbs, fats,  and other nutrients listed on the label is based on one serving of the item. Many items contain more than one serving per package.  Check the total grams (g) of carbs in one serving. You can calculate the number of servings of carbs in one serving by dividing the total carbs by 15. For example, if a food has 30 g of total carbs, it would be equal to 2 servings of carbs.  Check the number of grams (g) of saturated and trans fats in one serving. Choose foods that have low or no amount of these  fats.  Check the number of milligrams (mg) of salt (sodium) in one serving. Most people should limit total sodium intake to less than 2,300 mg per day.  Always check the nutrition information of foods labeled as "low-fat" or "nonfat". These foods may be higher in added sugar or refined carbs and should be avoided.  Talk to your dietitian to identify your daily goals for nutrients listed on the label. Shopping  Avoid buying canned, premade, or processed foods. These foods tend to be high in fat, sodium, and added sugar.  Shop around the outside edge of the grocery store. This includes fresh fruits and vegetables, bulk grains, fresh meats, and fresh dairy. Cooking  Use low-heat cooking methods, such as baking, instead of high-heat cooking methods like deep frying.  Cook using healthy oils, such as olive, canola, or sunflower oil.  Avoid cooking with butter, cream, or high-fat meats. Meal planning  Eat meals and snacks regularly, preferably at the same times every day. Avoid going long periods of time without eating.  Eat foods high in fiber, such as fresh fruits, vegetables, beans, and whole grains. Talk to your dietitian about how many servings of carbs you can eat at each meal.  Eat 4-6 ounces (oz) of lean protein each day, such as lean meat, chicken, fish, eggs, or tofu. One oz of lean protein is equal to: ? 1 oz of meat, chicken, or fish. ? 1 egg. ?  cup of tofu.  Eat some foods each day that contain healthy fats, such as avocado, nuts, seeds, and fish. Lifestyle  Check your blood glucose regularly.  Exercise regularly as told by your health care provider. This may include: ? 150 minutes of moderate-intensity or vigorous-intensity exercise each week. This could be brisk walking, biking, or water aerobics. ? Stretching and doing strength exercises, such as yoga or weightlifting, at least 2 times a week.  Take medicines as told by your health care provider.  Do not use any  products that contain nicotine or tobacco, such as cigarettes and e-cigarettes. If you need help quitting, ask your health care provider.  Work with a Social worker or diabetes educator to identify strategies to manage stress and any emotional and social challenges. Questions to ask a health care provider  Do I need to meet with a diabetes educator?  Do I need to meet with a dietitian?  What number can I call if I have questions?  When are the best times to check my blood glucose? Where to find more information:  American Diabetes Association: diabetes.org  Academy of Nutrition and Dietetics: www.eatright.[employer] of Diabetes and Digestive and Kidney Diseases (NIH): [employer] Summary  A healthy meal plan will help you control your blood glucose and maintain a healthy lifestyle.  Working with a diet and nutrition specialist (dietitian) can help you make a meal plan that is best for you.  Keep in mind  that carbohydrates (carbs) and alcohol have immediate effects on your blood glucose levels. It is important to count carbs and to use alcohol carefully. This information is not intended to replace advice given to you by your health care provider. Make sure you discuss any questions you have with your health care provider. Document Revised: 07/26/2017 Document Reviewed: 09/17/2016 Elsevier Patient Education  2020 [employer]. Diabetes Basics  Diabetes (diabetes mellitus) is a long-term (chronic) disease. It occurs when the body does not properly use sugar (glucose) that is released from food after you eat. Diabetes may be caused by one or both of these problems:  Your pancreas does not make enough of a hormone called insulin.  Your body does not react in a normal way to insulin that it makes. Insulin lets sugars (glucose) go into cells in your body. This gives you energy. If you have diabetes, sugars cannot get into cells. This causes high blood sugar  (hyperglycemia). Follow these instructions at home: How is diabetes treated? You may need to take insulin or other diabetes medicines daily to keep your blood sugar in balance. Take your diabetes medicines every day as told by your doctor. List your diabetes medicines here: Diabetes medicines  Name of medicine: ______________________________ ? Amount (dose): _______________ Time (a.m./p.m.): _______________ Notes: ___________________________________  Name of medicine: ______________________________ ? Amount (dose): _______________ Time (a.m./p.m.): _______________ Notes: ___________________________________  Name of medicine: ______________________________ ? Amount (dose): _______________ Time (a.m./p.m.): _______________ Notes: ___________________________________ If you use insulin, you will learn how to give yourself insulin by injection. You may need to adjust the amount based on the food that you eat. List the types of insulin you use here: Insulin  Insulin type: ______________________________ ? Amount (dose): _______________ Time (a.m./p.m.): _______________ Notes: ___________________________________  Insulin type: ______________________________ ? Amount (dose): _______________ Time (a.m./p.m.): _______________ Notes: ___________________________________  Insulin type: ______________________________ ? Amount (dose): _______________ Time (a.m./p.m.): _______________ Notes: ___________________________________  Insulin type: ______________________________ ? Amount (dose): _______________ Time (a.m./p.m.): _______________ Notes: ___________________________________  Insulin type: ______________________________ ? Amount (dose): _______________ Time (a.m./p.m.): _______________ Notes: ___________________________________ How do I manage my blood sugar?  Check your blood sugar levels using a blood glucose monitor as directed by your doctor. Your doctor will set treatment goals for you.  Generally, you should have these blood sugar levels:  Before meals (preprandial): 80-130 mg/dL (4.4-7.2 mmol/L).  After meals (postprandial): below 180 mg/dL (10 mmol/L).  A1c level: less than 7%. Write down the times that you will check your blood sugar levels: Blood sugar checks  Time: _______________ Notes: ___________________________________  Time: _______________ Notes: ___________________________________  Time: _______________ Notes: ___________________________________  Time: _______________ Notes: ___________________________________  Time: _______________ Notes: ___________________________________  Time: _______________ Notes: ___________________________________  What do I need to know about low blood sugar? Low blood sugar is called hypoglycemia. This is when blood sugar is at or below 70 mg/dL (3.9 mmol/L). Symptoms may include:  Feeling: ? Hungry. ? Worried or nervous (anxious). ? Sweaty and clammy. ? Confused. ? Dizzy. ? Sleepy. ? Sick to your stomach (nauseous).  Having: ? A fast heartbeat. ? A headache. ? A change in your vision. ? Tingling or no feeling (numbness) around the mouth, lips, or tongue. ? Jerky movements that you cannot control (seizure).  Having trouble with: ? Moving (coordination). ? Sleeping. ? Passing out (fainting). ? Getting upset easily (irritability). Treating low blood sugar To treat low blood sugar, eat or drink something sugary right away. If you can think clearly and swallow safely, follow the 15:15 rule:  Take 15 grams of a fast-acting carb (carbohydrate). Talk with your doctor about how much you should take.  Some fast-acting carbs are: ? Sugar tablets (glucose pills). Take 3-4 glucose pills. ? 6-8 pieces of hard candy. ? 4-6 oz (120-150 mL) of fruit juice. ? 4-6 oz (120-150 mL) of regular (not diet) soda. ? 1 Tbsp (15 mL) honey or sugar.  Check your blood sugar 15 minutes after you take the carb.  If your blood  sugar is still at or below 70 mg/dL (3.9 mmol/L), take 15 grams of a carb again.  If your blood sugar does not go above 70 mg/dL (3.9 mmol/L) after 3 tries, get help right away.  After your blood sugar goes back to normal, eat a meal or a snack within 1 hour. Treating very low blood sugar If your blood sugar is at or below 54 mg/dL (3 mmol/L), you have very low blood sugar (severe hypoglycemia). This is an emergency. Do not wait to see if the symptoms will go away. Get medical help right away. Call your local emergency services (911 in the [place] Do not drive yourself to the hospital. Questions to ask your health care provider  Do I need to meet with a diabetes educator?  What equipment will I need to care for myself at home?  What diabetes medicines do I need? When should I take them?  How often do I need to check my blood sugar?  What number can I call if I have questions?  When is my next doctor's visit?  Where can I find a support group for people with diabetes? Where to find more information  American Diabetes Association: www.diabetes.org  American Association of Diabetes Educators: www.diabeteseducator.org/patient-resources Contact a doctor if:  Your blood sugar is at or above 240 mg/dL (13.3 mmol/L) for 2 days in a row.  You have been sick or have had a fever for 2 days or more, and you are not getting better.  You have any of these problems for more than 6 hours: ? You cannot eat or drink. ? You feel sick to your stomach (nauseous). ? You throw up (vomit). ? You have watery poop (diarrhea). Get help right away if:  Your blood sugar is lower than 54 mg/dL (3 mmol/L).  You get confused.  You have trouble: ? Thinking clearly. ? Breathing. Summary  Diabetes (diabetes mellitus) is a long-term (chronic) disease. It occurs when the body does not properly use sugar (glucose) that is released from food after digestion.  Take insulin and diabetes medicines as  told.  Check your blood sugar every day, as often as told.  Keep all follow-up visits as told by your doctor. This is important. This information is not intended to replace advice given to you by your health care provider. Make sure you discuss any questions you have with your health care provider. Document Revised: 05/06/2019 Document Reviewed: 11/15/2017 Elsevier Patient Education  [place].

## 2020-02-24 ENCOUNTER — Other Ambulatory Visit: Payer: Self-pay | Admitting: Obstetrics and Gynecology

## 2020-02-24 DIAGNOSIS — Z9189 Other specified personal risk factors, not elsewhere classified: Secondary | ICD-10-CM

## 2020-04-29 ENCOUNTER — Other Ambulatory Visit: Payer: Self-pay

## 2020-04-29 ENCOUNTER — Other Ambulatory Visit: Payer: Managed Care, Other (non HMO)

## 2020-04-29 DIAGNOSIS — Z20822 Contact with and (suspected) exposure to covid-19: Secondary | ICD-10-CM

## 2020-04-30 LAB — NOVEL CORONAVIRUS, NAA: SARS-CoV-2, NAA: NOT DETECTED

## 2020-04-30 MED FILL — METFORMIN HCL 1000 MG TABS: 1000 | 30 days supply | Qty: 60 | Fill #1

## 2020-05-11 ENCOUNTER — Ambulatory Visit (INDEPENDENT_AMBULATORY_CARE_PROVIDER_SITE_OTHER): Payer: Managed Care, Other (non HMO) | Admitting: Family Medicine

## 2020-05-11 ENCOUNTER — Encounter: Payer: Self-pay | Admitting: Family Medicine

## 2020-05-11 ENCOUNTER — Other Ambulatory Visit: Payer: Self-pay

## 2020-05-11 VITALS — BP 117/80 | HR 79 | Temp 98.5°F | Ht 62.25 in | Wt 199.4 lb

## 2020-05-11 DIAGNOSIS — E119 Type 2 diabetes mellitus without complications: Secondary | ICD-10-CM | POA: Diagnosis not present

## 2020-05-11 LAB — POCT GLYCOSYLATED HEMOGLOBIN (HGB A1C)
HbA1c POC (<> result, manual entry): 6.7 % (ref 4.0–5.6)
HbA1c, POC (controlled diabetic range): 6.7 % (ref 0.0–7.0)
HbA1c, POC (prediabetic range): 6.7 % — AB (ref 5.7–6.4)
Hemoglobin A1C: 6.7 % — AB (ref 4.0–5.6)

## 2020-05-11 NOTE — Patient Instructions (Signed)
SO PROUD OF YOU!!!!!  KEEP UP THE GOOD WORK!!!  See you in 6 months.   Dr. Rogers Blocker

## 2020-05-11 NOTE — Progress Notes (Signed)
Patient: Cassidy Gibson MRN: [id number] DOB: 1971/03/02 PCP: Orma Flaming, MD     Subjective:  Chief Complaint  Patient presents with  . Diabetes    HPI: The patient is a 49 y.o. female who presents today for Diabetes. Her last a1c was 7.7 and we started her on metformin 1000mg  BID.   Diabetes: Patient is here for follow up of type 2 diabetes. First diagnosed 01/2020.  Currently on the following medications metformin. Takes medications as prescribed. Last A1C was 7.7. Currently exercising and following diabetic diet.  Denies any hypoglycemic events. Denies any vision changes, nausea, vomiting, abdominal pain, ulcers/paraesthesia in feet, polyuria, polydipsia or polyphagia. Denies any chest pain, shortness of breath. She has lost 5 pounds since I saw her last.   Went to [place] since I saw her last.   Review of Systems  Constitutional: Negative for chills, fatigue and fever.  HENT: Negative for dental problem, ear pain, hearing loss and trouble swallowing.   Eyes: Negative for visual disturbance.  Respiratory: Negative for cough, chest tightness, shortness of breath and wheezing.   Cardiovascular: Negative for chest pain, palpitations and leg swelling.  Gastrointestinal: Negative for abdominal pain, blood in stool, diarrhea and nausea.  Endocrine: Negative for cold intolerance, polydipsia, polyphagia and polyuria.  Genitourinary: Negative for dysuria, frequency, hematuria and urgency.  Musculoskeletal: Negative for arthralgias and back pain.  Skin: Negative for rash.  Neurological: Negative for dizziness and headaches.  Psychiatric/Behavioral: Negative for dysphoric mood and sleep disturbance. The patient is not nervous/anxious.     Allergies Patient has No Known Allergies.  Past Medical History Patient  has a past medical history of Allergy and Prediabetes.  Surgical History Patient  has a past surgical history that includes Cesarean section (2001); Adenoidectomy (1978);  Breast reduction surgery (1998); and Tympanostomy tube placement (1979).  Family History Pateint's family history includes Breast cancer in her mother; Cancer in her mother; Diabetes in her father, maternal aunt, maternal grandmother, maternal uncle, paternal aunt, paternal grandmother, and paternal uncle; GER disease in her mother; Heart disease in her brother and paternal grandfather; Hyperlipidemia in her brother; Hypertension in her brother and father.  Social History Patient  reports that she has never smoked. She has never used smokeless tobacco. She reports that she does not drink alcohol and does not use drugs.    Objective: Vitals:   05/11/20 0933  BP: 117/80  Pulse: 79  Temp: 98.5 F (36.9 C)  TempSrc: Temporal  SpO2: 97%  Weight: 199 lb 6.4 oz (90.4 kg)  Height: 5' 2.25" (1.581 m)    Body mass index is 36.18 kg/m.  Physical Exam Vitals reviewed.  Constitutional:      Appearance: Normal appearance. She is obese.  HENT:     Head: Normocephalic and atraumatic.  Cardiovascular:     Rate and Rhythm: Normal rate and regular rhythm.     Heart sounds: Normal heart sounds.  Pulmonary:     Effort: Pulmonary effort is normal.     Breath sounds: Normal breath sounds.  Abdominal:     General: Bowel sounds are normal.     Palpations: Abdomen is soft.  Neurological:     General: No focal deficit present.     Mental Status: She is alert and oriented to person, place, and time.  Psychiatric:        Mood and Affect: Mood normal.        Behavior: Behavior normal.         A1c: 6.7  Assessment/plan: 1. Type 2 diabetes mellitus without complication, without long-term current use of insulin (HCC) A1c significantly improved. Very proud of her. Continue current medication and diet/exercise.  Discussed ideal weight of 146 pounds. Continue diet/carb counting and exercise. F/u in 6 months.  - POCT HgB A1C    This visit occurred during the SARS-CoV-2 public health emergency.   Safety protocols were in place, including screening questions prior to the visit, additional usage of staff PPE, and extensive cleaning of exam room while observing appropriate contact time as indicated for disinfecting solutions.     Return in about 6 months (around 11/08/2020) for diabetes .   Orma Flaming, MD [facility]   05/11/2020

## 2020-06-01 MED FILL — FLUOCINONIDE 0.05% SOLUTION: 0.05 | 30 days supply | Qty: 60 | Fill #0

## 2020-06-13 MED FILL — METFORMIN HCL 1000 MG TABS: 1000 | 30 days supply | Qty: 60 | Fill #2

## 2020-06-13 MED FILL — BETAMETHASONE DP AUG 0.05%: 0.05 | 4 days supply | Qty: 15 | Fill #1

## 2020-06-28 ENCOUNTER — Ambulatory Visit (INDEPENDENT_AMBULATORY_CARE_PROVIDER_SITE_OTHER): Payer: Managed Care, Other (non HMO)

## 2020-06-28 ENCOUNTER — Encounter: Payer: Self-pay | Admitting: Family Medicine

## 2020-06-28 ENCOUNTER — Other Ambulatory Visit: Payer: Self-pay

## 2020-06-28 DIAGNOSIS — Z23 Encounter for immunization: Secondary | ICD-10-CM

## 2020-07-05 ENCOUNTER — Ambulatory Visit
Admission: RE | Admit: 2020-07-05 | Discharge: 2020-07-05 | Disposition: A | Discharge status: Home / Self Care | Payer: Managed Care, Other (non HMO) | Source: Ambulatory Visit | Attending: Obstetrics and Gynecology | Admitting: Obstetrics and Gynecology

## 2020-07-05 DIAGNOSIS — Z9189 Other specified personal risk factors, not elsewhere classified: Secondary | ICD-10-CM

## 2020-07-05 IMAGING — MR MR BREAST BILAT WO/W CM
8 of 13 series · 29 of 48 positions shown · IV contrast (10 GADAVIST)
Comparison: No prior MRI. Multiple prior mammograms, most recently
[DATE].

CLINICAL DATA: 49-year-old with a strong family history of breast
cancer in her mother at age 60, a maternal aunt at age 85 and a
paternal aunt at age 70. At prior [facility] genetic screening, she was
found to have an estimated lifetime risk of developing breast cancer
of 33%. High risk supplemental screening MRI is performed.

LABS:  Not applicable.
EXAM:
BILATERAL BREAST MRI WITH AND WITHOUT CONTRAST
TECHNIQUE: Multiplanar, multisequence MR images of both breasts were obtained
prior to and following the intravenous administration of 10 ml of
Gadavist.

[Series 3: t2_tirm_tra ipat (a-p) · axial · 3.0mm · 0.74mm/px · 1 of 63 slices shown]
[im 1/63]
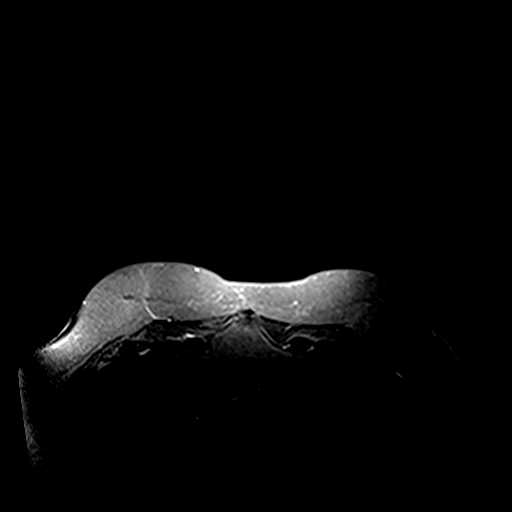

[Series 4: fl3d pre-cm no · axial · non-contrast · 0.9mm · 0.99mm/px · z∈[-10,+162]mm · 5 of 192 slices shown]
[im 1/192]
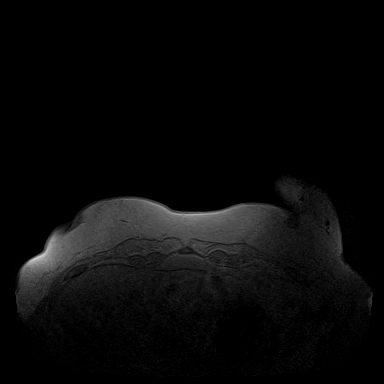
[im 48/192]
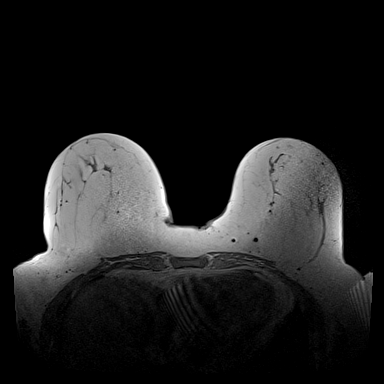
[im 96/192]
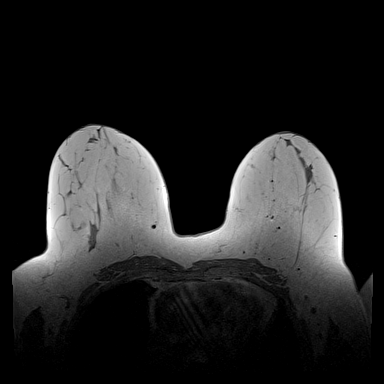
[im 144/192]
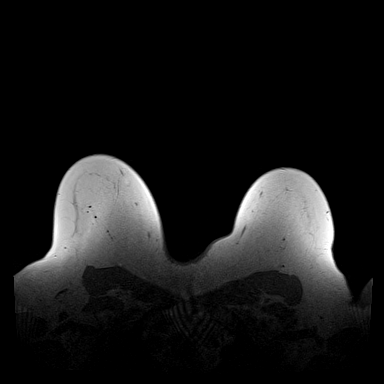
[im 192/192]
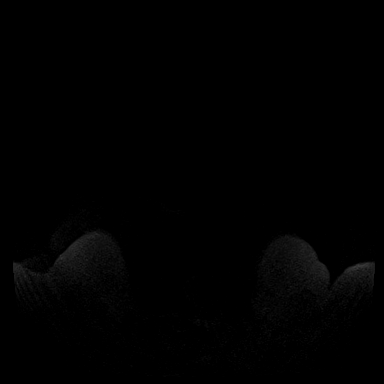

[Series 5: fl3d pre-cm · axial · non-contrast · 0.9mm · 0.91mm/px · z∈[-10,+162]mm · 5 of 192 slices shown]
[im 1/192]
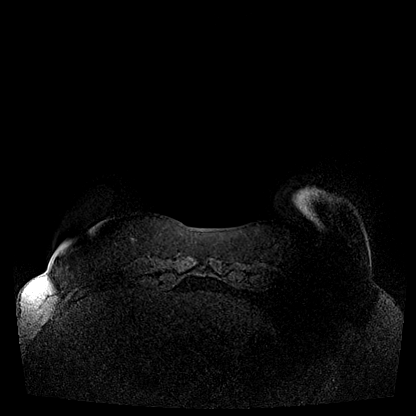
[im 48/192]
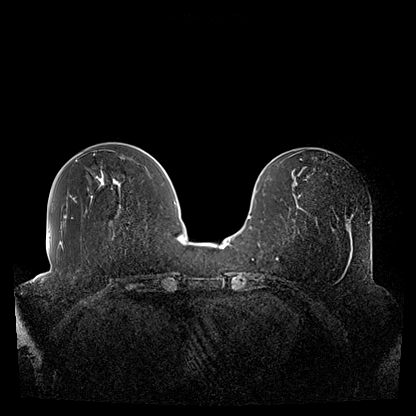
[im 96/192]
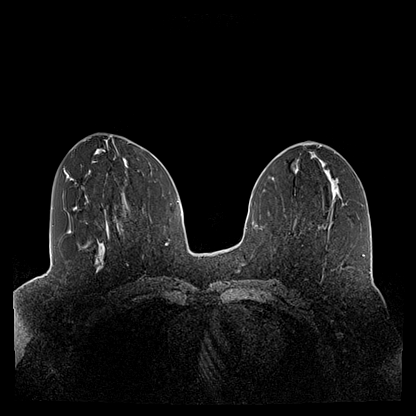
[im 144/192]
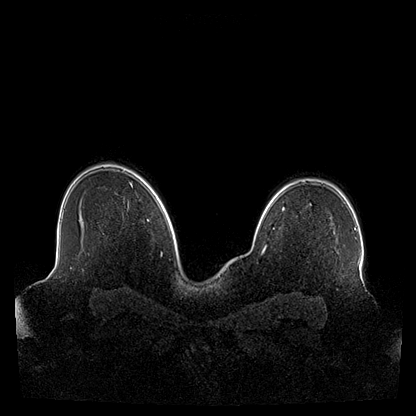
[im 192/192]
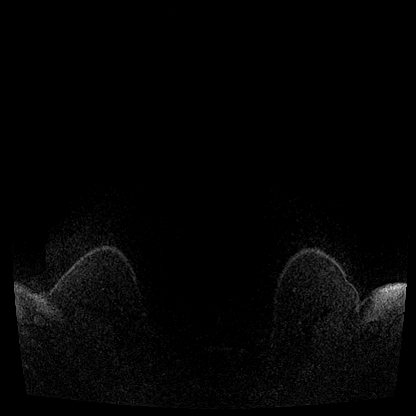

[Series 6: fl3d post-cm 20 · axial · 0.9mm · 0.91mm/px · z∈[-10,+162]mm · 5 of 192 slices shown (1 of 3)]
[im 1/192]
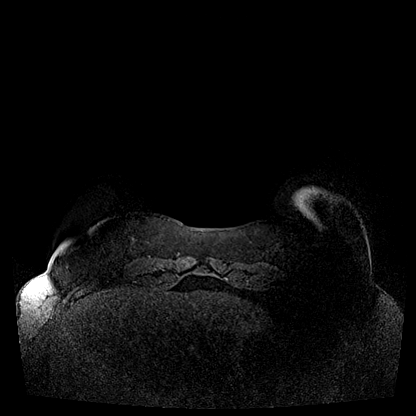
[im 48/192]
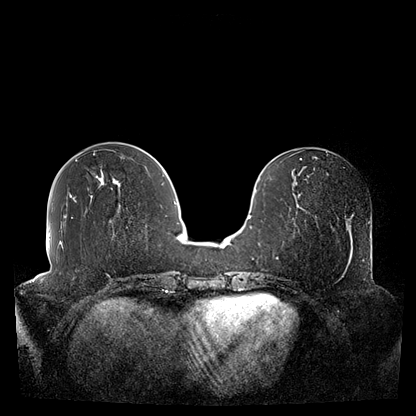
[im 96/192]
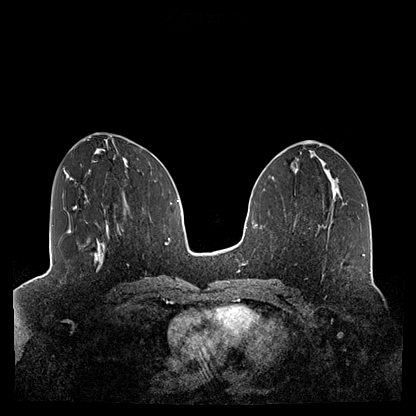
[im 144/192]
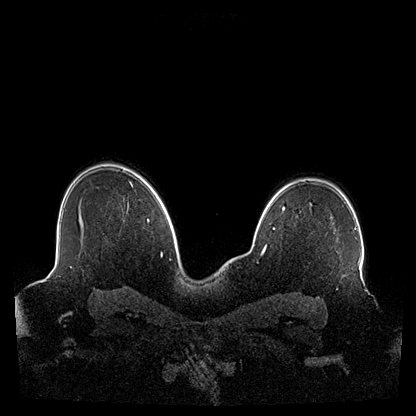
[im 192/192]
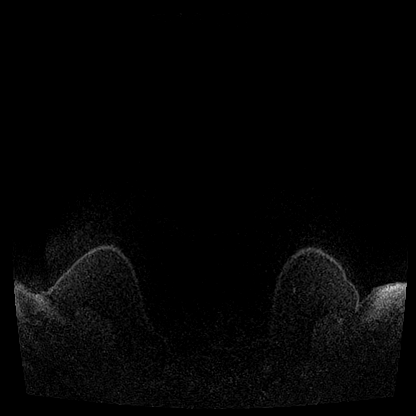

[Series 7: fl3d post-cm 20 · axial · 0.9mm · 0.91mm/px · z∈[-10,+162]mm · 5 of 192 slices shown (2 of 3)]
[im 1/192]
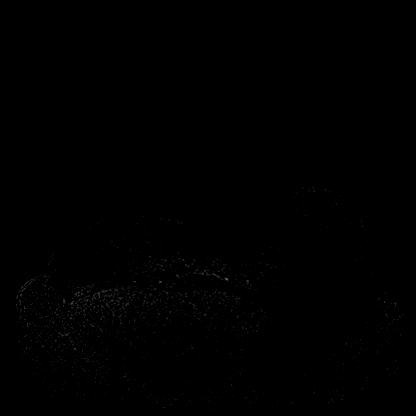
[im 48/192]
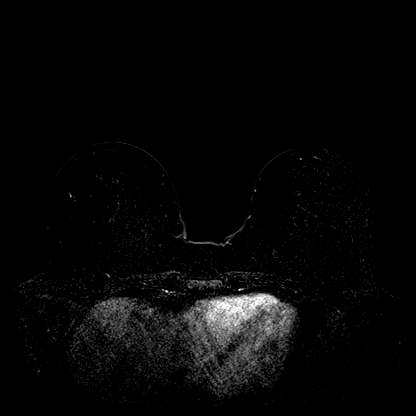
[im 96/192]
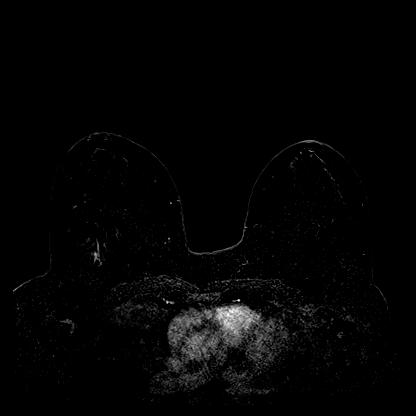
[im 144/192]
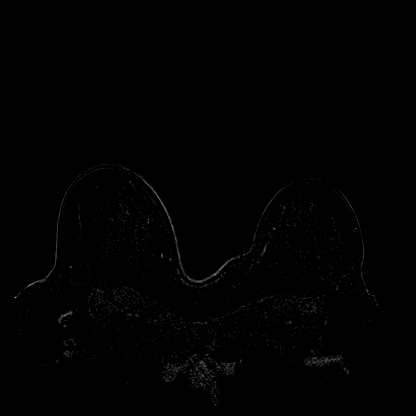
[im 192/192]
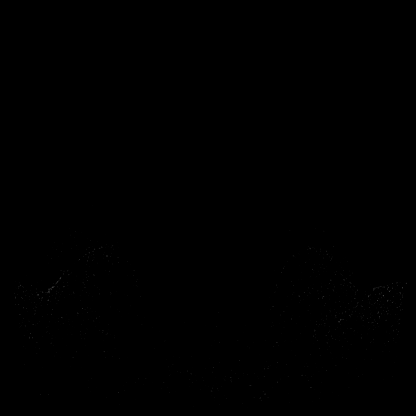

[Series 8: fl3d post-cm 20 · axial · 172.8mm · 0.91mm/px · 1 of 1 slices shown (3 of 3)]
[im 1/1]
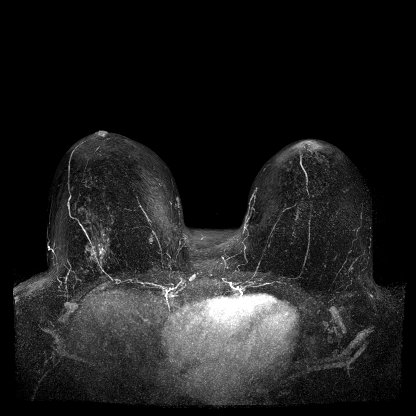

[Series 9: fl3d post-cm 3min · axial · 0.9mm · 0.91mm/px · z∈[-10,+162]mm · 5 of 192 slices shown]
[im 1/192]
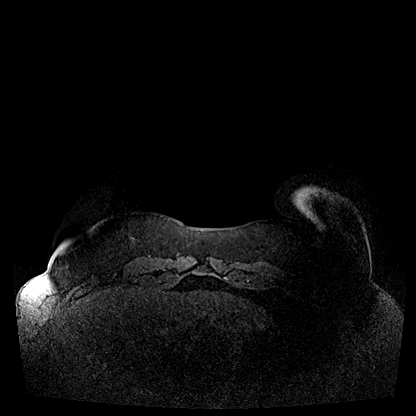
[im 48/192]
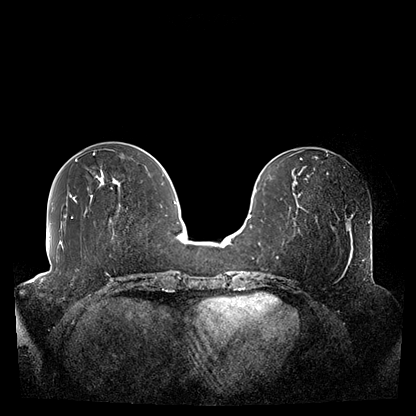
[im 96/192]
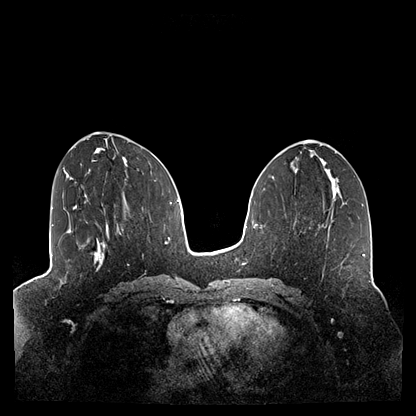
[im 144/192]
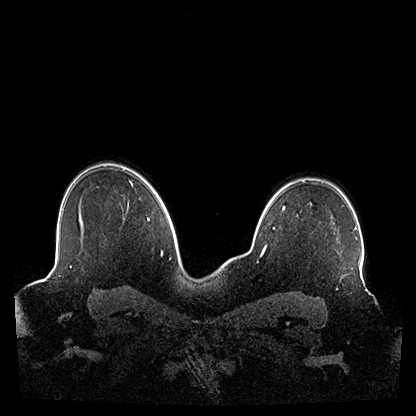
[im 192/192]
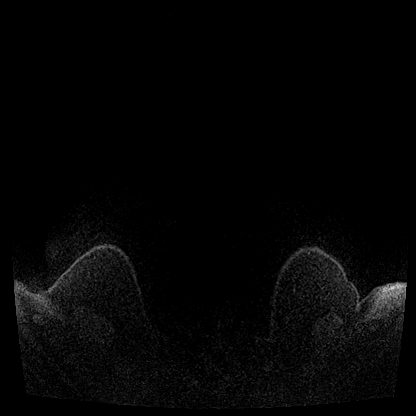

[Series 10: fl3d post-cm 3min_sub · axial · 0.9mm · 0.91mm/px · z∈[-10,+24]mm · 2 of 192 slices shown]
[im 1/192]
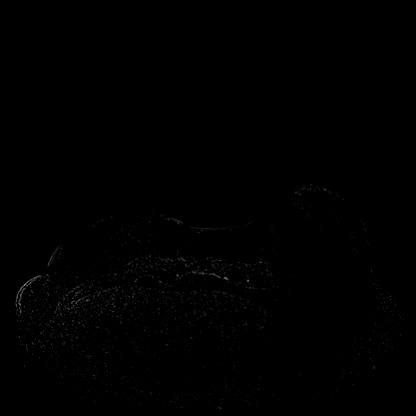
[im 39/192]
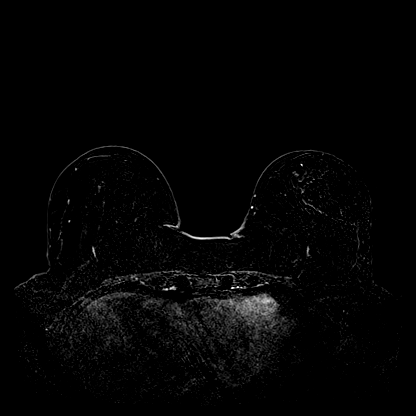

[29 of 48 positions shown; findings below may reference images not displayed]

Three-dimensional MR images were rendered by post-processing of the
original MR data on an independent workstation. The
three-dimensional MR images were interpreted, and findings are
reported in the following complete MRI report for this study. Three
dimensional images were evaluated at the independent interpreting
workstation using the DynaCAD thin client.
FINDINGS: Breast composition: b. Scattered fibroglandular tissue.

Background parenchymal enhancement: Mild.

Right breast: Linear and clumped non-mass enhancement involving the
UPPER OUTER QUADRANT at POSTERIOR depth spanning approximately
cm (AP) and 1.7 cm (craniocaudal), demonstrating mixed plateau and
washout kinetics. No suspicious findings elsewhere.

Left breast: No suspicious mass or abnormal enhancement.

Lymph nodes: No pathologic lymphadenopathy.

Ancillary findings:  None.
IMPRESSION: 1. Indeterminate linear and clumped non-mass enhancement involving
the UPPER OUTER QUADRANT of the RIGHT breast at POSTERIOR depth
spanning approximately 4.0 cm.
2. No MRI evidence of malignancy involving the LEFT breast.
3. No pathologic lymphadenopathy.

RECOMMENDATION:
MRI guided core needle biopsy of the non-mass enhancement involving
the UPPER OUTER QUADRANT of the RIGHT breast.

BI-RADS CATEGORY  4: Suspicious.

## 2020-07-05 MED ORDER — GADOBUTROL 1 MMOL/ML IV SOLN
10.0000 mL | Freq: Once | INTRAVENOUS | Status: AC | PRN
  Administered 2020-07-05: 10 mL via INTRAVENOUS

## 2020-07-07 ENCOUNTER — Other Ambulatory Visit: Payer: Self-pay | Admitting: Obstetrics and Gynecology

## 2020-07-07 DIAGNOSIS — R9389 Abnormal findings on diagnostic imaging of other specified body structures: Secondary | ICD-10-CM

## 2020-07-12 ENCOUNTER — Ambulatory Visit
Admission: RE | Admit: 2020-07-12 | Discharge: 2020-07-12 | Disposition: A | Discharge status: Home / Self Care | Payer: Managed Care, Other (non HMO) | Source: Ambulatory Visit

## 2020-07-12 ENCOUNTER — Ambulatory Visit
Admission: RE | Admit: 2020-07-12 | Discharge: 2020-07-12 | Disposition: A | Discharge status: Home / Self Care | Payer: Managed Care, Other (non HMO) | Source: Ambulatory Visit | Attending: Obstetrics and Gynecology | Admitting: Obstetrics and Gynecology

## 2020-07-12 ENCOUNTER — Other Ambulatory Visit: Payer: Self-pay | Admitting: Obstetrics and Gynecology

## 2020-07-12 ENCOUNTER — Other Ambulatory Visit: Payer: Self-pay

## 2020-07-12 DIAGNOSIS — R9389 Abnormal findings on diagnostic imaging of other specified body structures: Secondary | ICD-10-CM

## 2020-07-12 IMAGING — MR MR BREAST BX W/ LOC DEV 1ST LEASION IMAGE BX SPEC MR GUIDE*R*
7 of 10 series · 31 of 48 positions shown · IV contrast (10 ml Gadavist)
Comparison: Previous exams.
COMPARISON: Previous exams.

Addendum:
CLINICAL DATA: 49-year-old female for MR guided biopsies of
posterior and anterior aspects of 4 cm area of posterior OUTER RIGHT
breast non masslike enhancement.

EXAM:
MRI GUIDED CORE NEEDLE BIOPSY OF THE RIGHT BREAST X 2
TECHNIQUE: Multiplanar, multisequence MR imaging of the RIGHT breast was
performed both before and after administration of intravenous
contrast.
CONTRAST:  10mL GADAVIST GADOBUTROL 1 MMOL/ML IV SOLN

[Series 2: fiducial unilateral · sagittal · 2.0mm · 1.33mm/px · 3 of 60 slices shown]
[im 1/60]
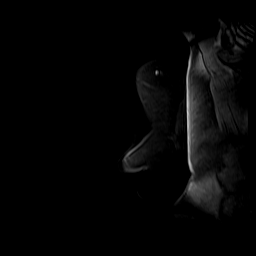
[im 30/60]
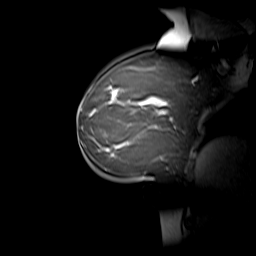
[im 60/60]
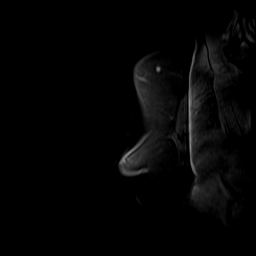

[Series 3: dynamic pre · axial · non-contrast · 1.3mm · 0.73mm/px · z∈[-46,+140]mm · 5 of 144 slices shown]
[im 1/144]
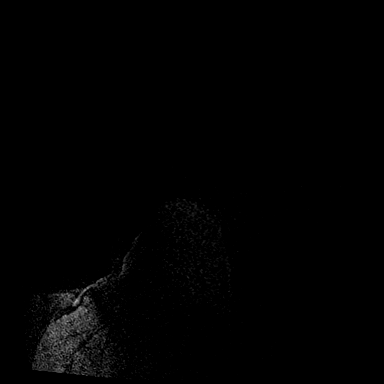
[im 36/144]
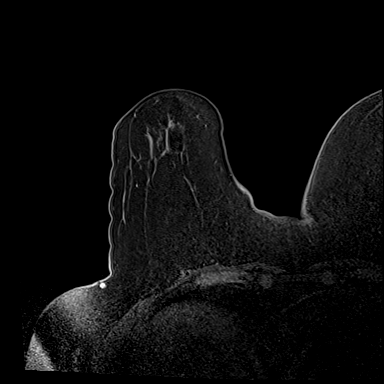
[im 72/144]
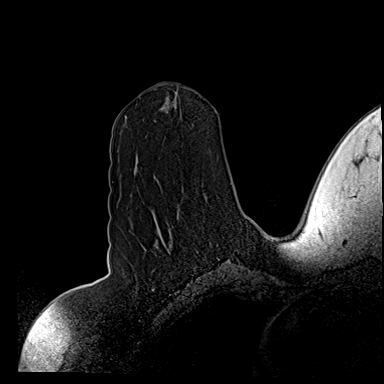
[im 108/144]
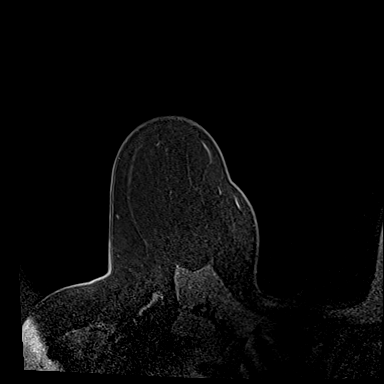
[im 144/144]
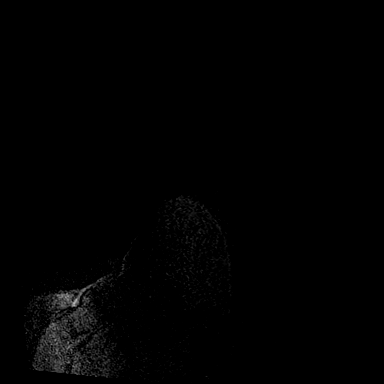

[Series 4: dynamic post 20 · axial · 1.3mm · 0.73mm/px · z∈[-46,+140]mm · 5 of 144 slices shown (1 of 2)]
[im 1/144]
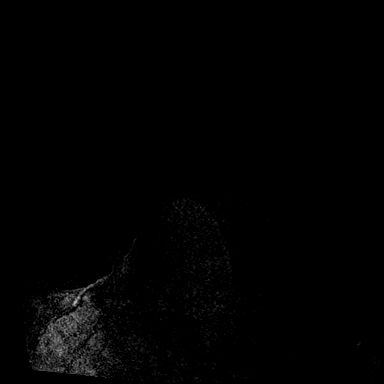
[im 36/144]
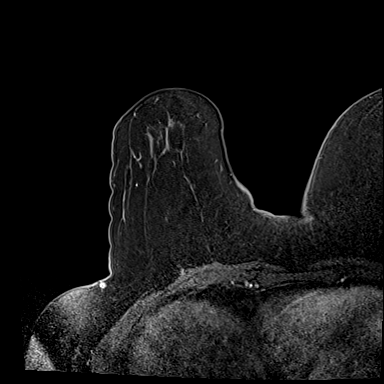
[im 72/144]
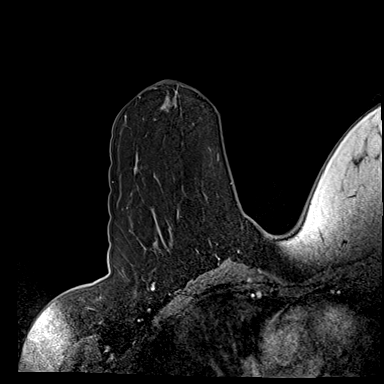
[im 108/144]
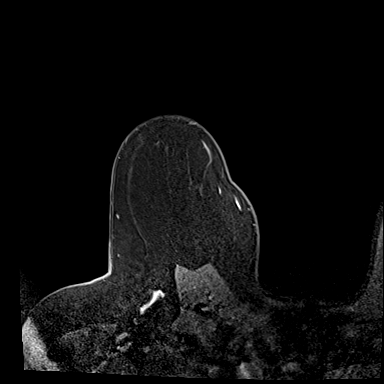
[im 144/144]
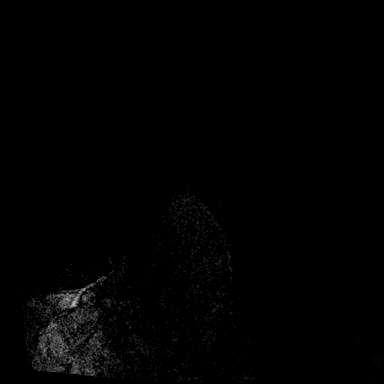

[Series 5: dynamic post 20 · axial · 1.3mm · 0.73mm/px · z∈[-46,+140]mm · 5 of 144 slices shown (2 of 2)]
[im 1/144]
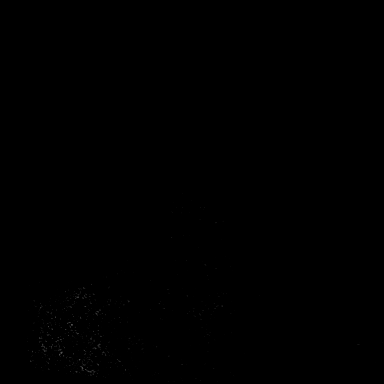
[im 36/144]
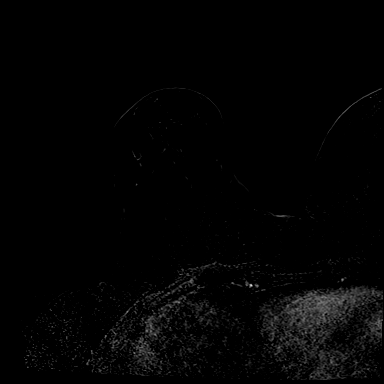
[im 72/144]
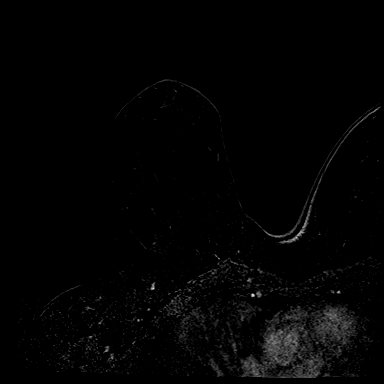
[im 108/144]
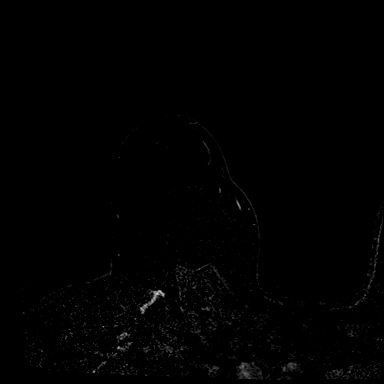
[im 144/144]
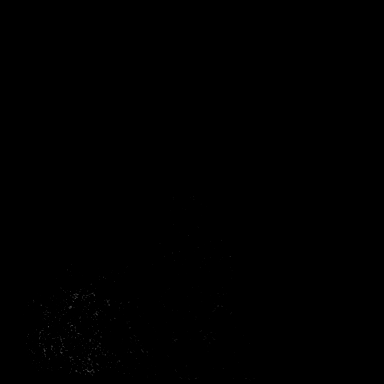

[Series 6: dynamic post 3 · axial · 1.3mm · 0.73mm/px · z∈[-46,+140]mm · 5 of 144 slices shown (1 of 2)]
[im 1/144]
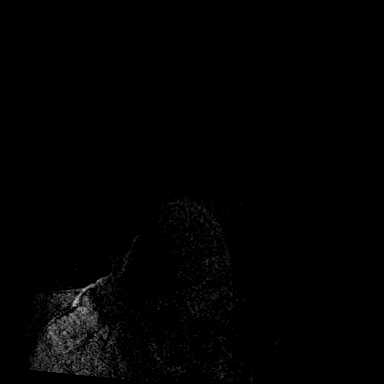
[im 36/144]
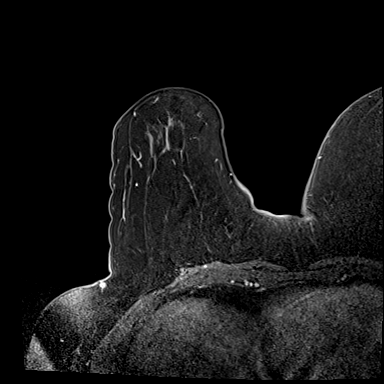
[im 72/144]
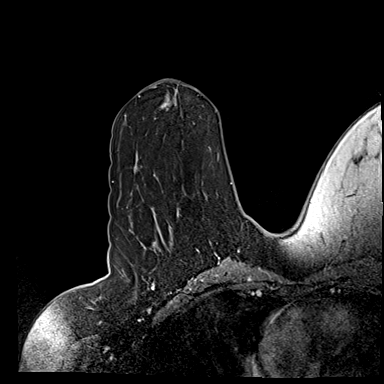
[im 108/144]
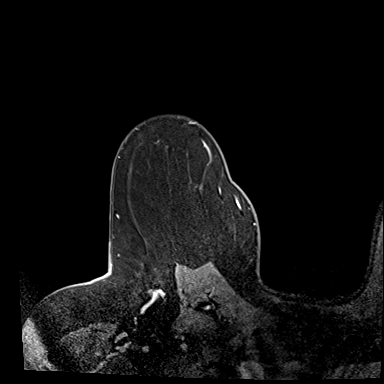
[im 144/144]
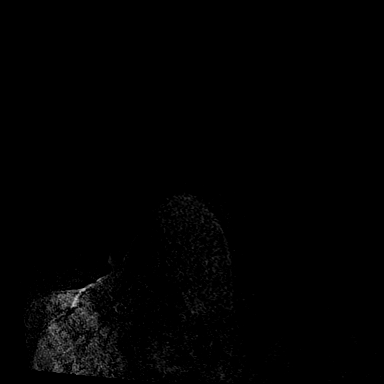

[Series 7: dynamic post 3 · axial · 1.3mm · 0.73mm/px · z∈[-46,+140]mm · 5 of 144 slices shown (2 of 2)]
[im 1/144]
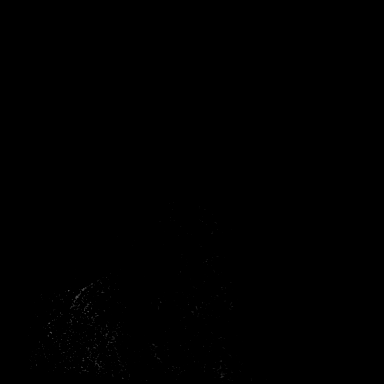
[im 36/144]
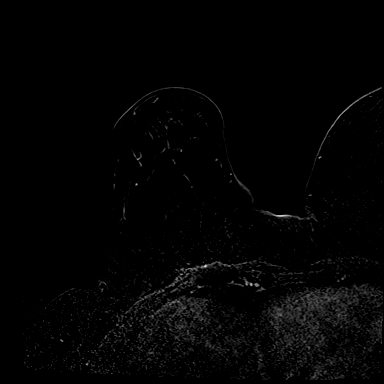
[im 72/144]
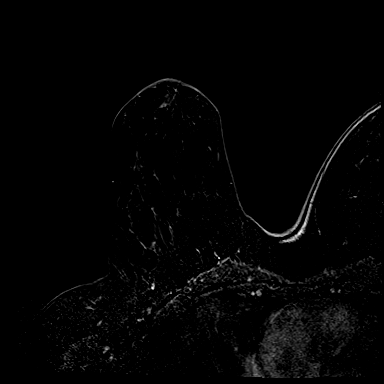
[im 108/144]
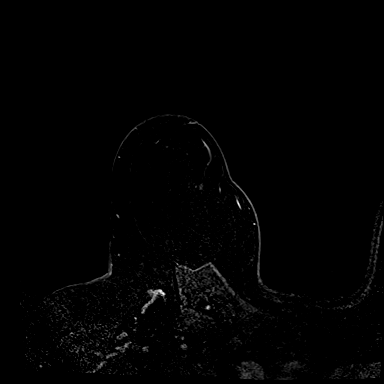
[im 144/144]
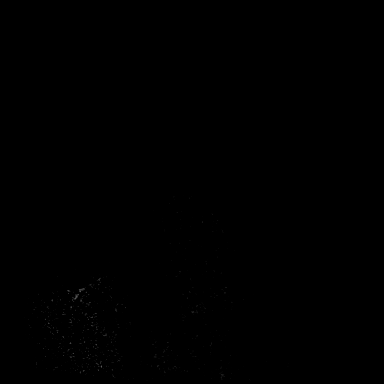

[Series 8: needle confirmation · axial · 1.3mm · 0.73mm/px · z∈[-46,+46]mm · 3 of 144 slices shown]
[im 1/144]
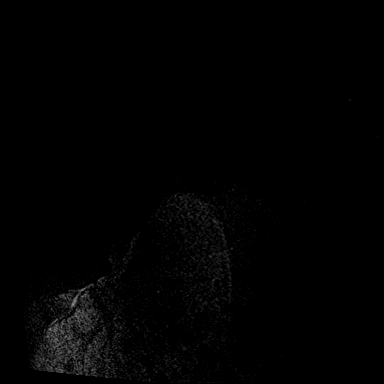
[im 36/144]
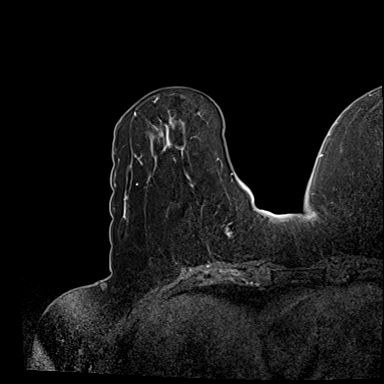
[im 72/144]
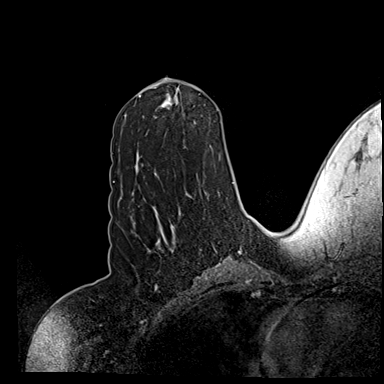

[31 of 48 positions shown; findings below may reference images not displayed]

FINDINGS: I met with the patient, and we discussed the procedure of MRI guided
biopsy, including risks, benefits, and alternatives. Specifically,
we discussed the risks of infection, bleeding, tissue injury, clip
migration, and inadequate sampling. Informed, written consent was
given. The usual time out protocol was performed immediately prior
to the procedure.

MR GUIDED CORE NEEDLE BIOPSY OF THE RIGHT BREAST #1 (posterior
aspect of OUTER RIGHT breast non masslike enhancement - BARBELL
clip):

Using sterile technique, 1% Lidocaine, MRI guidance, and a 9 gauge
vacuum assisted device, biopsy was performed of the posterior aspect
of non masslike enhancement within the posterior OUTER RIGHT breast
using a LATERAL approach. At the conclusion of the procedure, a
BARBELL tissue marker clip was deployed into the biopsy cavity.
Follow-up 2-view mammogram was performed and dictated separately.

MR GUIDED CORE NEEDLE BIOPSY OF THE RIGHT BREAST #2 (anterior aspect
of OUTER RIGHT breast non masslike enhancement - CYLINDER clip):

Using sterile technique, 1% Lidocaine, MRI guidance, and a 9 gauge
vacuum assisted device, biopsy was performed of the anterior aspect
of non masslike enhancement within the posterior OUTER RIGHT breast
using a LATERAL approach. At the conclusion of the procedure, a
CYLINDER tissue marker clip was deployed into the biopsy cavity.
Follow-up 2-view mammogram was performed and dictated separately.

Hemostasis pads were applied to the skin entry sites.
IMPRESSION: MRI guided biopsies of posterior and anterior aspect of non masslike
enhancement within the OUTER RIGHT breast. No apparent
complications.

ADDENDUM:
Pathology revealed FIBROADENOMATOUS CHANGE WITH CALCIFICATIONS.
FIBROCYSTIC CHANGES WITH USUAL DUCTAL HYPERPLASIA of the RIGHT
breast, posterior, outer, barbell clip. This was found to be
concordant by Dr. AUJLA.

Pathology revealed FIBROCYSTIC CHANGES WITH USUAL DUCTAL HYPERPLASIA
of the RIGHT breast, anterior, outer, cylinder clip. This was found
to be concordant by Dr. AUJLA.

Pathology results were discussed with the patient by telephone. The
patient reported doing well after the biopsies with tenderness at
the sites. Post biopsy instructions and care were reviewed and
questions were answered. The patient was encouraged to call The

Bilateral breast MRI recommended in 6 months per protocol.

Pathology results reported by AUJLA RN on [DATE].

*** End of Addendum ***
FINDINGS: I met with the patient, and we discussed the procedure of MRI guided
biopsy, including risks, benefits, and alternatives. Specifically,
we discussed the risks of infection, bleeding, tissue injury, clip
migration, and inadequate sampling. Informed, written consent was
given. The usual time out protocol was performed immediately prior
to the procedure.

MR GUIDED CORE NEEDLE BIOPSY OF THE RIGHT BREAST #1 (posterior
aspect of OUTER RIGHT breast non masslike enhancement - BARBELL
clip):

Using sterile technique, 1% Lidocaine, MRI guidance, and a 9 gauge
vacuum assisted device, biopsy was performed of the posterior aspect
of non masslike enhancement within the posterior OUTER RIGHT breast
using a LATERAL approach. At the conclusion of the procedure, a
BARBELL tissue marker clip was deployed into the biopsy cavity.
Follow-up 2-view mammogram was performed and dictated separately.

MR GUIDED CORE NEEDLE BIOPSY OF THE RIGHT BREAST #2 (anterior aspect
of OUTER RIGHT breast non masslike enhancement - CYLINDER clip):

Using sterile technique, 1% Lidocaine, MRI guidance, and a 9 gauge
vacuum assisted device, biopsy was performed of the anterior aspect
of non masslike enhancement within the posterior OUTER RIGHT breast
using a LATERAL approach. At the conclusion of the procedure, a
CYLINDER tissue marker clip was deployed into the biopsy cavity.
Follow-up 2-view mammogram was performed and dictated separately.

Hemostasis pads were applied to the skin entry sites.
IMPRESSION: MRI guided biopsies of posterior and anterior aspect of non masslike
enhancement within the OUTER RIGHT breast. No apparent
complications.

## 2020-07-12 IMAGING — MG MM BREAST LOCALIZATION CLIP
6 series · 6 of 18 positions shown · non-contrast
Comparison: Previous exam(s).

CLINICAL DATA: Evaluate BARBELL and CYLINDER biopsy marker clips
following 2 MR guided RIGHT breast biopsies.

EXAM:
DIAGNOSTIC RIGHT MAMMOGRAM POST MRI BIOPSY

[R XCCL synth-2D]
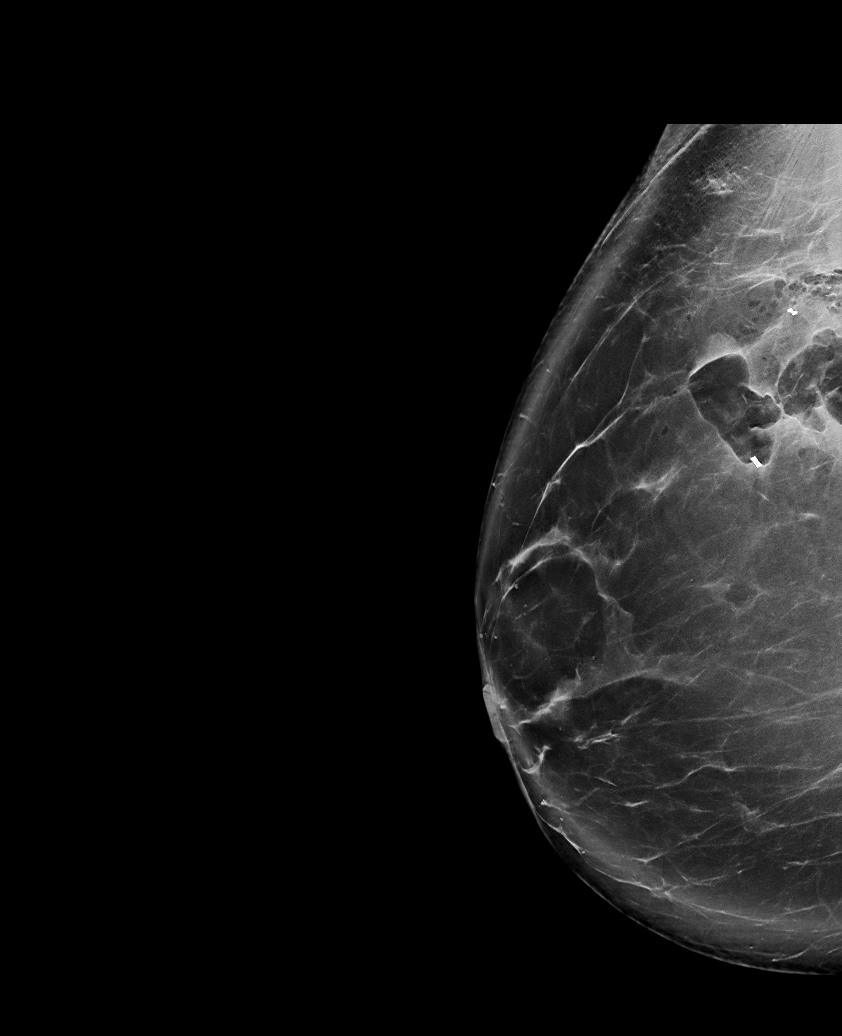

[R ML synth-2D]
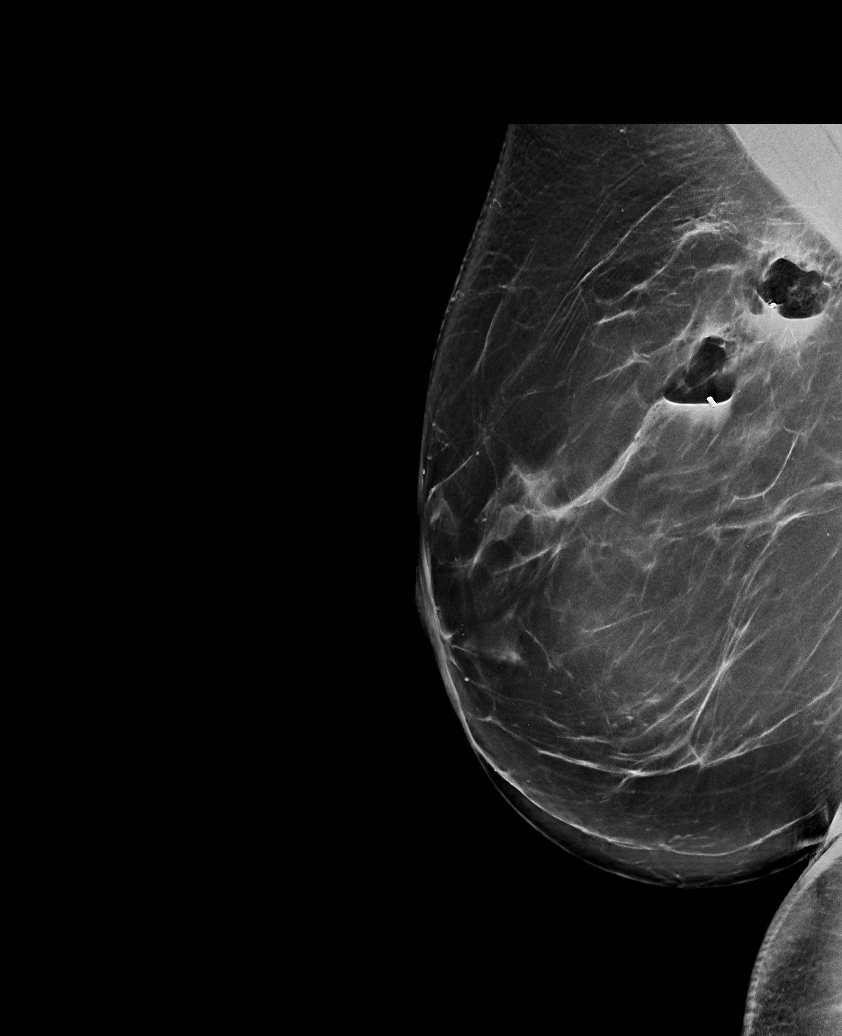

[R CC synth-2D]
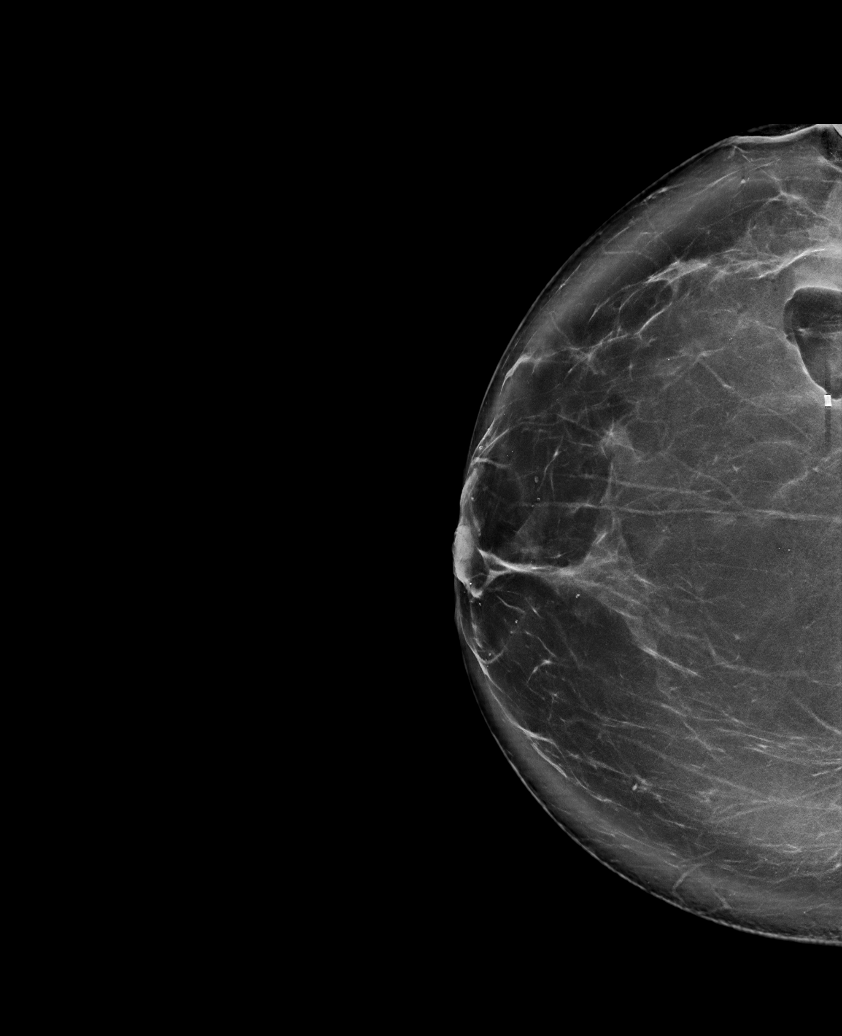

[R ML tomo · tomo slice 60/119.0]
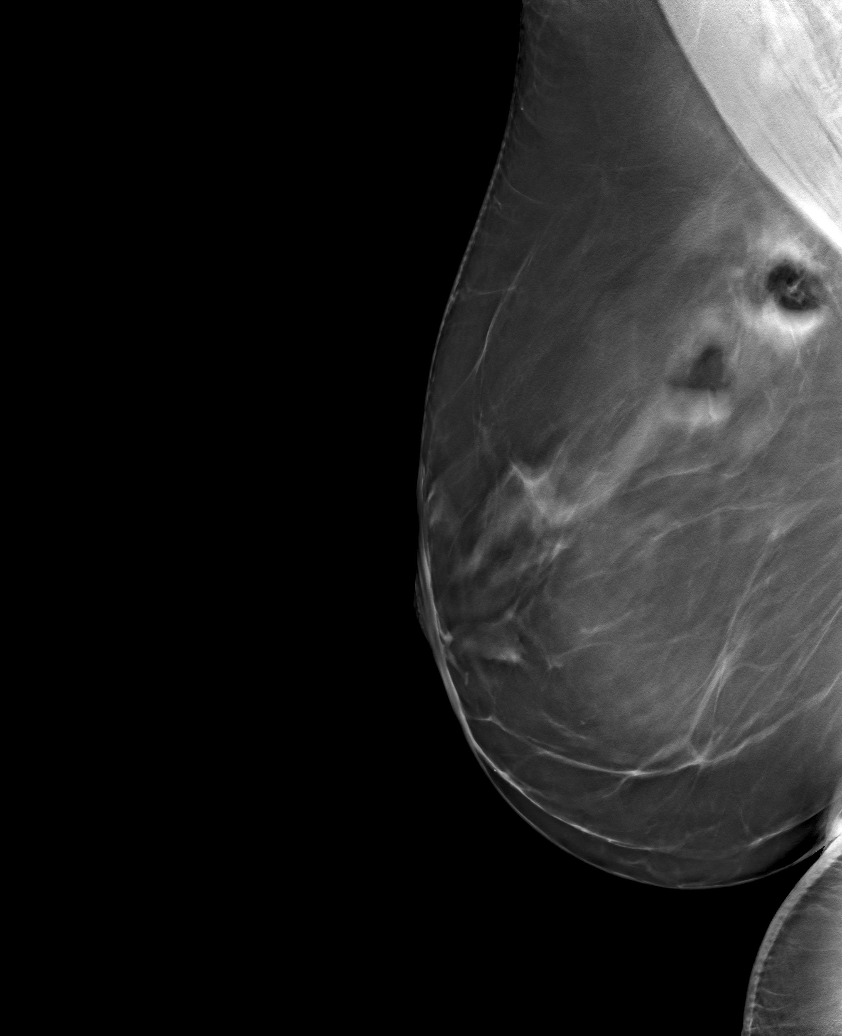

[R CC tomo · tomo slice 55/110.0]
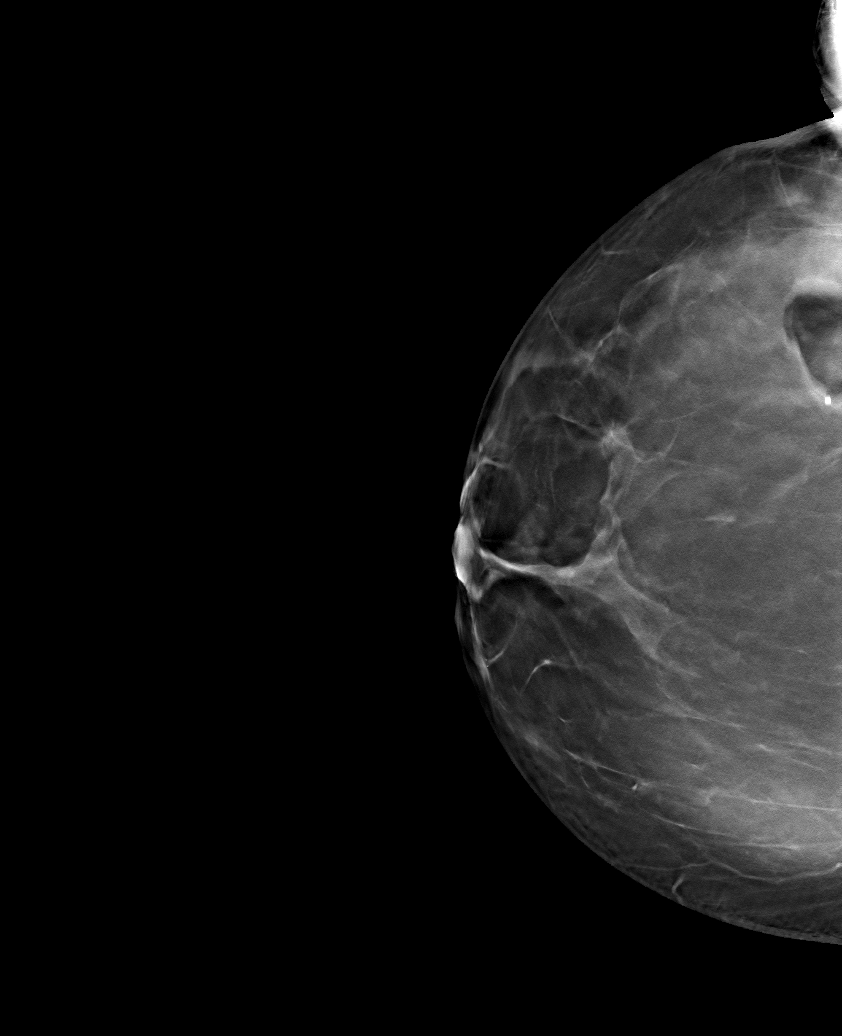

[R XCCL tomo · tomo slice 59/118.0]
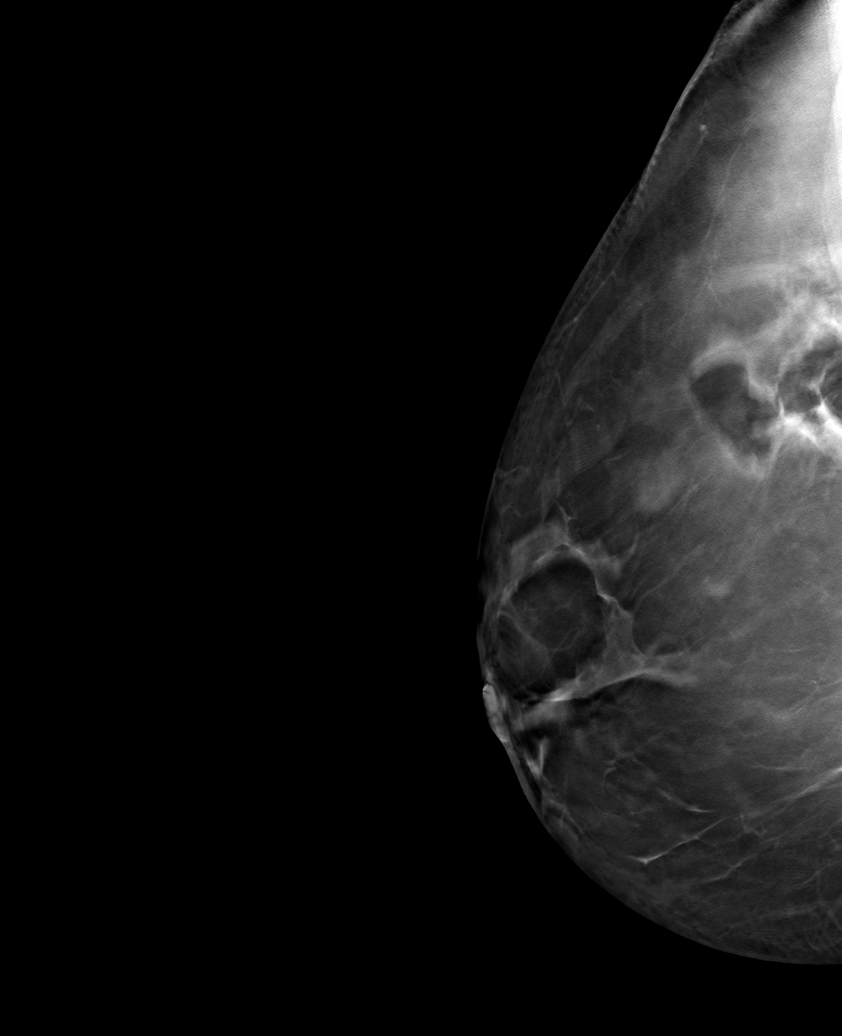

[6 of 18 positions shown; findings below may reference images not displayed]

FINDINGS: Mammographic images were obtained following MR guided biopsy of the
posterior and anterior aspects of non masslike enhancement within
the posterior OUTER RIGHT breast.

The BARBELL biopsy marking clip is in expected position at the site
of biopsy of the posterior aspect of the OUTER RIGHT breast non
masslike enhancement.

The CYLINDER biopsy marking clip is in expected position at the site
of biopsy of the anterior aspect of the OUTER RIGHT breast non
masslike enhancement.

The BARBELL and CYLINDER clips are separated by a distance of
cm.
IMPRESSION: Appropriate positioning of the BARBELL shaped biopsy marking clip at
the site of biopsy in the posterior UPPER-OUTER RIGHT breast.

Appropriate positioning of the CYLINDER shaped biopsy marking clip
at the site of biopsy in the UPPER OUTER RIGHT breast

The 2 clips are separated by a distance of 3.5 cm.

Final Assessment: Post Procedure Mammograms for Marker Placement

## 2020-07-12 MED ORDER — GADOBUTROL 1 MMOL/ML IV SOLN
10.0000 mL | Freq: Once | INTRAVENOUS | Status: AC | PRN
  Administered 2020-07-12: 10 mL via INTRAVENOUS

## 2020-07-13 MED FILL — METFORMIN HCL 1000 MG TABS: 1000 | 30 days supply | Qty: 60 | Fill #3

## 2020-07-25 ENCOUNTER — Other Ambulatory Visit: Payer: Self-pay | Admitting: Family Medicine

## 2020-07-25 ENCOUNTER — Encounter: Payer: Self-pay | Admitting: Family Medicine

## 2020-07-25 ENCOUNTER — Other Ambulatory Visit: Payer: Self-pay

## 2020-07-25 ENCOUNTER — Telehealth (INDEPENDENT_AMBULATORY_CARE_PROVIDER_SITE_OTHER): Payer: Managed Care, Other (non HMO) | Admitting: Family Medicine

## 2020-07-25 ENCOUNTER — Telehealth: Payer: Managed Care, Other (non HMO) | Admitting: Physician Assistant

## 2020-07-25 VITALS — Temp 97.8°F

## 2020-07-25 DIAGNOSIS — J069 Acute upper respiratory infection, unspecified: Secondary | ICD-10-CM | POA: Diagnosis not present

## 2020-07-25 MED ORDER — BENZONATATE 100 MG PO CAPS: 100.0000 mg | ORAL_CAPSULE | Freq: Three times a day (TID) | ORAL | 0 refills | Status: DC | PRN

## 2020-07-25 MED ORDER — PROMETHAZINE-DM 6.25-15 MG/5ML PO SYRP: 5.0000 mL | ORAL_SOLUTION | Freq: Four times a day (QID) | ORAL | 0 refills | Status: DC | PRN

## 2020-07-25 MED FILL — PROMETHAZINE W/DM SYRUP: 6.25-15 | 5 days supply | Qty: 118 | Fill #0

## 2020-07-25 MED FILL — BENZONATATE 100 MG CAPS: 100 | 10 days supply | Qty: 30 | Fill #0

## 2020-07-25 NOTE — Progress Notes (Signed)
Chief Complaint  Patient presents with  . Nasal Congestion  . Cough    Cassidy Gibson here for URI complaints. Due to COVID-19 pandemic, we are interacting via web portal for an electronic face-to-face visit. I verified patient's ID using 2 identifiers. Patient agreed to proceed with visit via this method. Patient is at home, I am at office. Patient and I are present for visit.   Duration: 1 week  Associated symptoms: sinus congestion, ear fullness, sore throat and cough Denies: sinus pain, rhinorrhea, itchy watery eyes, ear pain, ear drainage, wheezing, shortness of breath, myalgia and fevers, N/V/D Treatment to date: throat lozenges Sick contacts: No  Past Medical History:  Diagnosis Date  . Allergy   . Prediabetes     Temp 97.8 F (36.6 C) (Oral)  No conversational dyspnea Age appropriate judgment and insight Nml affect and mood  Viral URI with cough - Plan: benzonatate (TESSALON) 100 MG capsule, promethazine-dextromethorphan (PROMETHAZINE-DM) 6.25-15 MG/5ML syrup  Sent in 2 cough suppressants, caps as they will not make her drowsy and syrup for nighttime.  Continue to push fluids, practice good hand hygiene, cover mouth when coughing. F/u prn. If starting to experience fevers, shaking, or shortness of breath, seek immediate care. Pt voiced understanding and agreement to the plan.  [place], DO 07/25/20 3:57 PM

## 2020-08-11 ENCOUNTER — Ambulatory Visit (INDEPENDENT_AMBULATORY_CARE_PROVIDER_SITE_OTHER): Payer: Managed Care, Other (non HMO) | Admitting: Family Medicine

## 2020-08-11 ENCOUNTER — Encounter: Payer: Self-pay | Admitting: Family Medicine

## 2020-08-11 ENCOUNTER — Other Ambulatory Visit: Payer: Self-pay

## 2020-08-11 VITALS — BP 136/88 | HR 95 | Temp 98.1°F | Ht 62.25 in | Wt 203.8 lb

## 2020-08-11 DIAGNOSIS — J0100 Acute maxillary sinusitis, unspecified: Secondary | ICD-10-CM | POA: Diagnosis not present

## 2020-08-11 MED ORDER — AMOXICILLIN-POT CLAVULANATE 875-125 MG PO TABS: 1.0000 | ORAL_TABLET | Freq: Two times a day (BID) | ORAL | 0 refills | Status: DC

## 2020-08-11 MED ORDER — METHYLPREDNISOLONE ACETATE 80 MG/ML IJ SUSP
80.0000 mg | Freq: Once | INTRAMUSCULAR | Status: AC
  Administered 2020-08-11: 14:00:00 80 mg via INTRAMUSCULAR

## 2020-08-11 NOTE — Progress Notes (Signed)
Patient: Cassidy Gibson MRN: [id number] DOB: 19-Nov-1970 PCP: Orma Flaming, MD     Subjective:  Chief Complaint  Patient presents with   Facial Pain   Ear Fullness   Sinusitis   Cough    HPI: The patient is a 49 y.o. female who presents today for a dry cough and sinus pain and pressure. She also has some dizziness because she thinks her ear is full. She started to feel bad about 2 weeks ago with cough that was productive. She had a telehealth visit and was given cough medication. The past few days her sinuses have started to really hurt with pressure. Her ears are full and her face feels full. Her teeth also hurt. She "hawks" up mucous in the morning that is green and her cough seems more to post nasal drip. Cough is worse at night. Her congestion is bad and it's hard for her to breath at night. No fever/chills. No sick contacts. She was given tessalon pearls and promethazine-DM syrup. She isn't using these things as her cough isn't that bad.   She is covid vaccinated.  Has had her flu shot.    Review of Systems  Constitutional: Positive for chills. Negative for fatigue and fever.  HENT: Positive for congestion, sinus pressure and sinus pain. Negative for ear pain and sore throat.   Respiratory: Negative for cough, shortness of breath and wheezing.   Cardiovascular: Negative for chest pain and palpitations.  Gastrointestinal: Negative for abdominal pain, diarrhea, nausea and vomiting.  Neurological: Positive for light-headedness. Negative for dizziness and headaches.    Allergies Patient has No Known Allergies.  Past Medical History Patient  has a past medical history of Allergy and Prediabetes.  Surgical History Patient  has a past surgical history that includes Cesarean section (2001); Adenoidectomy (1978); Breast reduction surgery (1998); and Tympanostomy tube placement (1979).  Family History Pateint's family history includes Breast cancer in her mother; Cancer in  her mother; Diabetes in her father, maternal aunt, maternal grandmother, maternal uncle, paternal aunt, paternal grandmother, and paternal uncle; GER disease in her mother; Heart disease in her brother and paternal grandfather; Hyperlipidemia in her brother; Hypertension in her brother and father.  Social History Patient  reports that she has never smoked. She has never used smokeless tobacco. She reports that she does not drink alcohol and does not use drugs.    Objective: Vitals:   08/11/20 1301  BP: 136/88  Pulse: 95  Temp: 98.1 F (36.7 C)  TempSrc: Temporal  SpO2: 95%  Weight: 203 lb 12.8 oz (92.4 kg)  Height: 5' 2.25" (1.581 m)    Body mass index is 36.98 kg/m.  Physical Exam Vitals reviewed.  Constitutional:      Appearance: Normal appearance. She is obese.  HENT:     Head: Normocephalic and atraumatic.     Comments: Quite impressive TTP over bilateral maxillary sinuses     Right Ear: Tympanic membrane, ear canal and external ear normal.     Left Ear: Tympanic membrane, ear canal and external ear normal.     Nose: Congestion present.     Comments: Bilateral nasal turbinates  Cardiovascular:     Rate and Rhythm: Normal rate and regular rhythm.     Heart sounds: Normal heart sounds.  Pulmonary:     Effort: Pulmonary effort is normal.     Breath sounds: Normal breath sounds.  Abdominal:     General: Bowel sounds are normal.     Palpations: Abdomen is  soft.  Musculoskeletal:     Cervical back: Normal range of motion and neck supple.  Lymphadenopathy:     Cervical: No cervical adenopathy.  Skin:    General: Skin is warm.     Capillary Refill: Capillary refill takes less than 2 seconds.  Neurological:     General: No focal deficit present.     Mental Status: She is alert and oriented to person, place, and time.  Psychiatric:        Mood and Affect: Mood normal.        Behavior: Behavior normal.        Assessment/plan: 1. Acute non-recurrent maxillary  sinusitis Steroid shot and course of augmentin due to prolonged course and exam findings consistent with bacterial sinusitis. Discussed side effects of augmentin and to take with food. Would also get cool mist humidifier, honey, flonase. F/u as needed.  - methylPREDNISolone acetate (DEPO-MEDROL) injection 80 mg   This visit occurred during the SARS-CoV-2 public health emergency.  Safety protocols were in place, including screening questions prior to the visit, additional usage of staff PPE, and extensive cleaning of exam room while observing appropriate contact time as indicated for disinfecting solutions.     Return if symptoms worsen or fail to improve.   Orma Flaming, MD [place]   08/11/2020

## 2020-08-11 NOTE — Patient Instructions (Addendum)
Steroid shot today -flonase nightly and I would get a cool mist humidifier -honey is great for cough -course of augmentin for sinus infection. Take with food!   Merry christmas! Dr. Rogers Blocker    Sinusitis, Adult Sinusitis is soreness and swelling (inflammation) of your sinuses. Sinuses are hollow spaces in the bones around your face. They are located:  Around your eyes.  In the middle of your forehead.  Behind your nose.  In your cheekbones. Your sinuses and nasal passages are lined with a fluid called mucus. Mucus drains out of your sinuses. Swelling can trap mucus in your sinuses. This lets germs (bacteria, virus, or fungus) grow, which leads to infection. Most of the time, this condition is caused by a virus. What are the causes? This condition is caused by:  Allergies.  Asthma.  Germs.  Things that block your nose or sinuses.  Growths in the nose (nasal polyps).  Chemicals or irritants in the air.  Fungus (rare). What increases the risk? You are more likely to develop this condition if:  You have a weak body defense system (immune system).  You do a lot of swimming or diving.  You use nasal sprays too much.  You smoke. What are the signs or symptoms? The main symptoms of this condition are pain and a feeling of pressure around the sinuses. Other symptoms include:  Stuffy nose (congestion).  Runny nose (drainage).  Swelling and warmth in the sinuses.  Headache.  Toothache.  A cough that may get worse at night.  Mucus that collects in the throat or the back of the nose (postnasal drip).  Being unable to smell and taste.  Being very tired (fatigue).  A fever.  Sore throat.  Bad breath. How is this diagnosed? This condition is diagnosed based on:  Your symptoms.  Your medical history.  A physical exam.  Tests to find out if your condition is short-term (acute) or long-term (chronic). Your doctor may: ? Check your nose for growths  (polyps). ? Check your sinuses using a tool that has a light (endoscope). ? Check for allergies or germs. ? Do imaging tests, such as an MRI or CT scan. How is this treated? Treatment for this condition depends on the cause and whether it is short-term or long-term.  If caused by a virus, your symptoms should go away on their own within 10 days. You may be given medicines to relieve symptoms. They include: ? Medicines that shrink swollen tissue in the nose. ? Medicines that treat allergies (antihistamines). ? A spray that treats swelling of the nostrils. ? Rinses that help get rid of thick mucus in your nose (nasal saline washes).  If caused by bacteria, your doctor may wait to see if you will get better without treatment. You may be given antibiotic medicine if you have: ? A very bad infection. ? A weak body defense system.  If caused by growths in the nose, you may need to have surgery. Follow these instructions at home: Medicines  Take, use, or apply over-the-counter and prescription medicines only as told by your doctor. These may include nasal sprays.  If you were prescribed an antibiotic medicine, take it as told by your doctor. Do not stop taking the antibiotic even if you start to feel better. Hydrate and humidify   Drink enough water to keep your pee (urine) pale yellow.  Use a cool mist humidifier to keep the humidity level in your home above 50%.  Breathe in steam for  10-15 minutes, 3-4 times a day, or as told by your doctor. You can do this in the bathroom while a hot shower is running.  Try not to spend time in cool or dry air. Rest  Rest as much as you can.  Sleep with your head raised (elevated).  Make sure you get enough sleep each night. General instructions   Put a warm, moist washcloth on your face 3-4 times a day, or as often as told by your doctor. This will help with discomfort.  Wash your hands often with soap and water. If there is no soap and  water, use hand sanitizer.  Do not smoke. Avoid being around people who are smoking (secondhand smoke).  Keep all follow-up visits as told by your doctor. This is important. Contact a doctor if:  You have a fever.  Your symptoms get worse.  Your symptoms do not get better within 10 days. Get help right away if:  You have a very bad headache.  You cannot stop throwing up (vomiting).  You have very bad pain or swelling around your face or eyes.  You have trouble seeing.  You feel confused.  Your neck is stiff.  You have trouble breathing. Summary  Sinusitis is swelling of your sinuses. Sinuses are hollow spaces in the bones around your face.  This condition is caused by tissues in your nose that become inflamed or swollen. This traps germs. These can lead to infection.  If you were prescribed an antibiotic medicine, take it as told by your doctor. Do not stop taking it even if you start to feel better.  Keep all follow-up visits as told by your doctor. This is important. This information is not intended to replace advice given to you by your health care provider. Make sure you discuss any questions you have with your health care provider. Document Revised: 01/13/2018 Document Reviewed: 01/13/2018 Elsevier Patient Education  [place].

## 2020-08-12 ENCOUNTER — Other Ambulatory Visit: Payer: Managed Care, Other (non HMO)

## 2020-08-12 DIAGNOSIS — Z20822 Contact with and (suspected) exposure to covid-19: Secondary | ICD-10-CM

## 2020-08-13 LAB — NOVEL CORONAVIRUS, NAA: SARS-CoV-2, NAA: DETECTED — AB

## 2020-08-13 LAB — SARS-COV-2, NAA 2 DAY TAT

## 2020-08-15 MED FILL — METFORMIN HCL 1000 MG TABS: 1000 | 30 days supply | Qty: 60 | Fill #4

## 2020-09-19 MED FILL — METFORMIN HCL 1000 MG TABS: 1000 | 30 days supply | Qty: 60 | Fill #5

## 2020-10-11 ENCOUNTER — Encounter: Payer: Self-pay | Admitting: Family Medicine

## 2020-10-18 MED FILL — METFORMIN HCL 1000 MG TABS: 1000 | 30 days supply | Qty: 60 | Fill #6

## 2020-10-28 ENCOUNTER — Ambulatory Visit: Payer: Managed Care, Other (non HMO)

## 2020-11-04 ENCOUNTER — Ambulatory Visit: Payer: Managed Care, Other (non HMO) | Attending: Internal Medicine

## 2020-11-04 DIAGNOSIS — Z23 Encounter for immunization: Secondary | ICD-10-CM

## 2020-11-04 NOTE — Progress Notes (Signed)
   Covid-19 Vaccination Clinic  Name:  Cassidy Gibson    MRN: [id number] DOB: 07/18/1971  11/04/2020  [name] was observed post Covid-19 immunization for 15 minutes without incident. She was provided with Vaccine Information Sheet and instruction to access the V-Safe system.   [name] was instructed to call 911 with any severe reactions post vaccine: Marland Kitchen Difficulty breathing  . Swelling of face and throat  . A fast heartbeat  . A bad rash all over body  . Dizziness and weakness   Immunizations Administered    Name Date Dose VIS Date Route   Moderna Covid-19 Booster Vaccine 11/04/2020  2:10 PM 0.25 mL 06/15/2020 Intramuscular   Manufacturer: Moderna   Lot: [id number]   [place]: [id number]

## 2020-11-14 ENCOUNTER — Encounter: Payer: Self-pay | Admitting: Family Medicine

## 2020-11-14 ENCOUNTER — Other Ambulatory Visit: Payer: Self-pay | Admitting: Family Medicine

## 2020-11-14 ENCOUNTER — Other Ambulatory Visit: Payer: Self-pay

## 2020-11-14 ENCOUNTER — Ambulatory Visit (INDEPENDENT_AMBULATORY_CARE_PROVIDER_SITE_OTHER): Payer: Managed Care, Other (non HMO) | Admitting: Family Medicine

## 2020-11-14 VITALS — BP 112/74 | HR 84 | Temp 98.3°F | Ht 62.25 in | Wt 202.4 lb

## 2020-11-14 DIAGNOSIS — E119 Type 2 diabetes mellitus without complications: Secondary | ICD-10-CM | POA: Diagnosis not present

## 2020-11-14 LAB — POCT GLYCOSYLATED HEMOGLOBIN (HGB A1C): Hemoglobin A1C: 6.7 % — AB (ref 4.0–5.6)

## 2020-11-14 MED ORDER — METFORMIN HCL 1000 MG PO TABS
1000.0000 mg | ORAL_TABLET | Freq: Two times a day (BID) | ORAL | 3 refills | Status: DC
Start: 2020-11-14 — End: 2020-11-14

## 2020-11-14 MED FILL — METFORMIN HCL 1000 MG TABS: 1000 | 90 days supply | Qty: 180 | Fill #0

## 2020-11-14 NOTE — Progress Notes (Signed)
Patient: Cassidy Gibson MRN: [id number] DOB: [birth year] PCP: Orma Flaming, MD     Subjective:  Chief Complaint  Patient presents with  . Diabetes    HPI: The patient is a 50 y.o. female who presents today for DM.   Diabetes: Patient is here for follow up of type 2 diabetes. First diagnosed 01/2020.  Currently on the following medications metformin. Takes medications as prescribed. Last A1C was 6.7, today.  Currently not exercising, but she is following diabetic diet.  Denies any hypoglycemic events. Denies any vision changes, nausea, vomiting, abdominal pain, ulcers/paraesthesia in feet, polyuria, polydipsia or polyphagia. Denies any chest pain, shortness of breath.   -eye exam in June -strep pneumo needed, will do at her annual.  -cscope referral at annual. She would like to do everything at that time.    Review of Systems  Constitutional: Negative for chills, fatigue and fever.  HENT: Negative for congestion, dental problem, ear pain, hearing loss and trouble swallowing.   Eyes: Negative for visual disturbance.  Respiratory: Negative for cough, chest tightness and shortness of breath.   Cardiovascular: Negative for chest pain, palpitations and leg swelling.  Gastrointestinal: Negative for abdominal pain, blood in stool, diarrhea and nausea.  Endocrine: Negative for cold intolerance, polydipsia, polyphagia and polyuria.  Genitourinary: Negative for dysuria and hematuria.  Musculoskeletal: Negative for arthralgias.  Skin: Negative for rash.  Neurological: Negative for dizziness and headaches.  Psychiatric/Behavioral: Negative for dysphoric mood and sleep disturbance. The patient is not nervous/anxious.     Allergies Patient has No Known Allergies.  Past Medical History Patient  has a past medical history of Allergy and Prediabetes.  Surgical History Patient  has a past surgical history that includes Cesarean section (2001); Adenoidectomy (1978); Breast reduction surgery  (1998); and Tympanostomy tube placement (1979).  Family History Pateint's family history includes Breast cancer in her mother; Cancer in her mother; Diabetes in her father, maternal aunt, maternal grandmother, maternal uncle, paternal aunt, paternal grandmother, and paternal uncle; GER disease in her mother; Heart disease in her brother and paternal grandfather; Hyperlipidemia in her brother; Hypertension in her brother and father.  Social History Patient  reports that she has never smoked. She has never used smokeless tobacco. She reports that she does not drink alcohol and does not use drugs.    Objective: Vitals:   11/14/20 0840  BP: 112/74  Pulse: 84  Temp: 98.3 F (36.8 C)  TempSrc: Temporal  SpO2: 92%  Weight: 202 lb 6.4 oz (91.8 kg)  Height: 5' 2.25" (1.581 m)    Body mass index is 36.72 kg/m.  Physical Exam Vitals reviewed.  Constitutional:      Appearance: Normal appearance. She is well-developed. She is obese.  HENT:     Head: Normocephalic and atraumatic.  Eyes:     Conjunctiva/sclera: Conjunctivae normal.     Pupils: Pupils are equal, round, and reactive to light.  Neck:     Thyroid: No thyromegaly.  Cardiovascular:     Rate and Rhythm: Normal rate and regular rhythm.     Heart sounds: Normal heart sounds. No murmur heard.   Pulmonary:     Effort: Pulmonary effort is normal.     Breath sounds: Normal breath sounds.  Abdominal:     General: Bowel sounds are normal. There is no distension.     Palpations: Abdomen is soft.     Tenderness: There is no abdominal tenderness.  Musculoskeletal:     Cervical back: Normal range of motion and  neck supple.  Lymphadenopathy:     Cervical: No cervical adenopathy.  Skin:    General: Skin is warm and dry.     Capillary Refill: Capillary refill takes less than 2 seconds.     Findings: No rash.  Neurological:     General: No focal deficit present.     Mental Status: She is alert and oriented to person, place, and  time.     Cranial Nerves: No cranial nerve deficit.     Coordination: Coordination normal.     Deep Tendon Reflexes: Reflexes normal.  Psychiatric:        Mood and Affect: Mood normal.        Behavior: Behavior normal.        Assessment/plan: 1. Type 2 diabetes mellitus without complication, without long-term current use of insulin (HCC) -a1c steady at 6.7. continue current plan of metformin 1000mg  bid.  -she is going to start exercising and goal is to get a1c less than 6.5.  -eye exam in June -strep pneumo at annual -discuss statin at annual -f/u in 6 months.   - POCT HgB A1C   This visit occurred during the SARS-CoV-2 public health emergency.  Safety protocols were in place, including screening questions prior to the visit, additional usage of staff PPE, and extensive cleaning of exam room while observing appropriate contact time as indicated for disinfecting solutions.     Return in about 6 months (around 05/17/2021) for annual/diabetes .   Orma Flaming, MD [place]   11/14/2020

## 2020-11-14 NOTE — Patient Instructions (Signed)
-  you are doing great!!! a1c steady at 6.7  -see you back in 6 months!  -keep up the good work!

## 2021-01-17 ENCOUNTER — Other Ambulatory Visit: Payer: Self-pay | Admitting: Obstetrics and Gynecology

## 2021-01-17 DIAGNOSIS — N6019 Diffuse cystic mastopathy of unspecified breast: Secondary | ICD-10-CM

## 2021-02-22 ENCOUNTER — Other Ambulatory Visit (HOSPITAL_COMMUNITY): Payer: Self-pay

## 2021-02-22 MED FILL — Metformin HCl Tab 1000 MG: ORAL | 90 days supply | Qty: 180 | Fill #0 | Status: AC

## 2021-04-03 ENCOUNTER — Other Ambulatory Visit: Payer: Managed Care, Other (non HMO)

## 2021-04-03 ENCOUNTER — Telehealth: Payer: Self-pay

## 2021-04-03 MED ORDER — METFORMIN HCL 1000 MG PO TABS
ORAL_TABLET | ORAL | 0 refills | Status: DC
Start: 2021-04-03 — End: 2021-07-19

## 2021-04-03 NOTE — Telephone Encounter (Signed)
MEDICATION: metFORMIN (GLUCOPHAGE) 1000 MG tablet  PHARMACY: [facility] [place] Phone:  [phone]  Fax:  [phone]      Comments:   **Let patient know to contact pharmacy at the end of the day to make sure medication is ready. **  ** Please notify patient to allow 48-72 hours to process**  **Encourage patient to contact the pharmacy for refills or they can request refills through [facility]**

## 2021-04-03 NOTE — Telephone Encounter (Signed)
Spoke to pt told her Rx for Metformin has been sent to [facility]. Pt verbalized understanding.

## 2021-04-14 ENCOUNTER — Ambulatory Visit
Admission: RE | Admit: 2021-04-14 | Discharge: 2021-04-14 | Disposition: A | Discharge status: Home / Self Care | Payer: Managed Care, Other (non HMO) | Source: Ambulatory Visit | Attending: Obstetrics and Gynecology | Admitting: Obstetrics and Gynecology

## 2021-04-14 ENCOUNTER — Other Ambulatory Visit: Payer: Self-pay

## 2021-04-14 DIAGNOSIS — N6019 Diffuse cystic mastopathy of unspecified breast: Secondary | ICD-10-CM

## 2021-04-14 IMAGING — MR MR BREAST BILAT WO/W CM
8 of 12 series · 32 of 48 positions shown · IV contrast (10 ml gadavist)
Comparison: Previous exams including breast MRI dated [DATE].

CLINICAL DATA: Strong family history of breast cancer. Estimated
lifetime risk of breast cancer calculation of 33%. Status post
MRI-guided RIGHT biopsies (2 sites) in [DATE] revealing
benign fibrocystic change at both sites. Per protocol, patient
returns today for a six-month follow-up breast MRI.

LABS:  No performed on site.
EXAM:
BILATERAL BREAST MRI WITH AND WITHOUT CONTRAST
TECHNIQUE: Multiplanar, multisequence MR images of both breasts were obtained
prior to and following the intravenous administration of 10 ml of
Gadavist

[Series 2: t2_tirm_tra ipat (a-p) · axial · 3.0mm · 0.74mm/px · 1 of 55 slices shown]
[im 1/55]
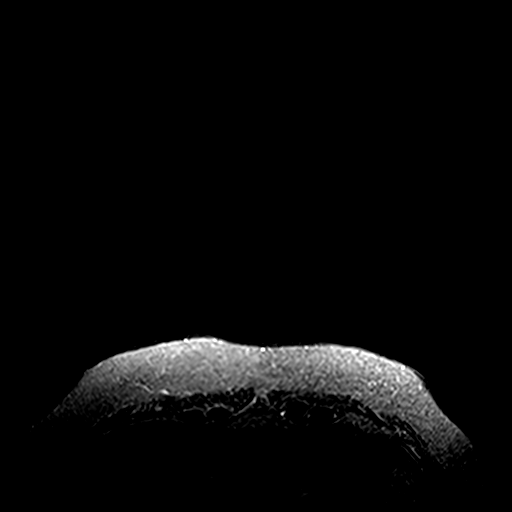

[Series 3: fl3d pre-cm non · axial · non-contrast · 1.2mm · 0.99mm/px · z∈[-67,+105]mm · 5 of 144 slices shown]
[im 1/144]
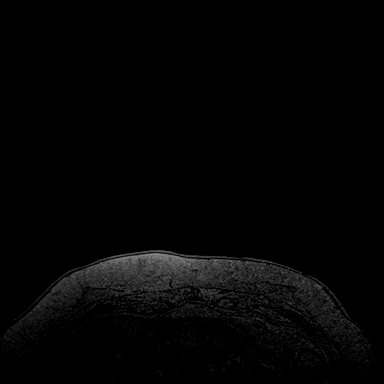
[im 36/144]
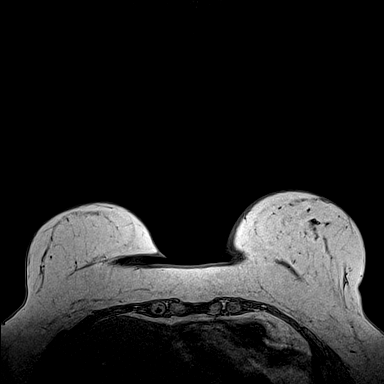
[im 72/144]
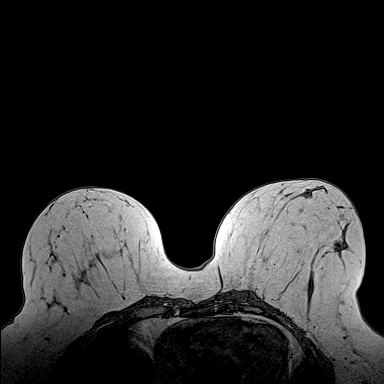
[im 108/144]
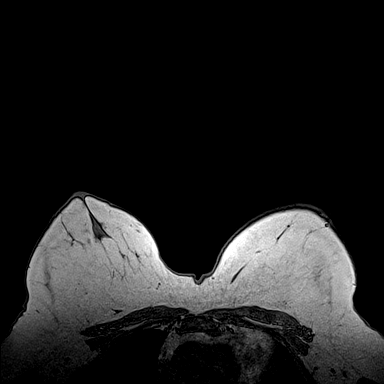
[im 144/144]
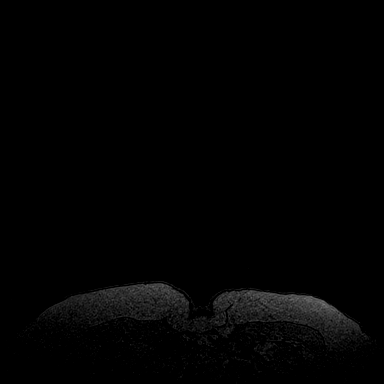

[Series 4: fl3d pre-cm · axial · non-contrast · 1.2mm · 0.99mm/px · z∈[-67,+105]mm · 5 of 144 slices shown]
[im 1/144]
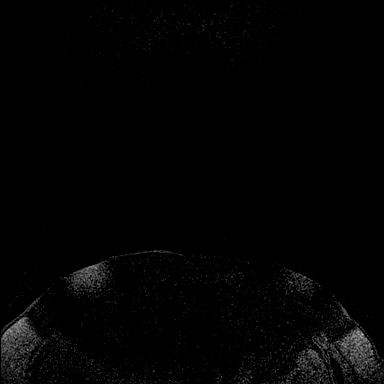
[im 36/144]
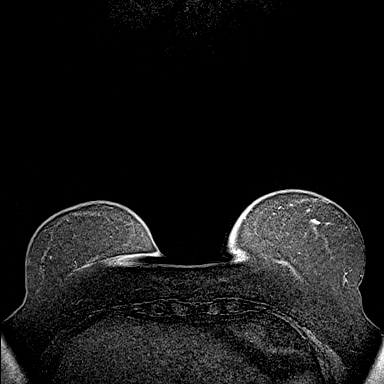
[im 72/144]
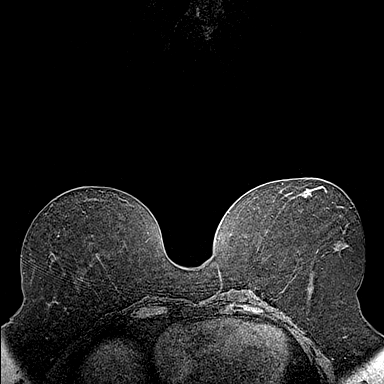
[im 108/144]
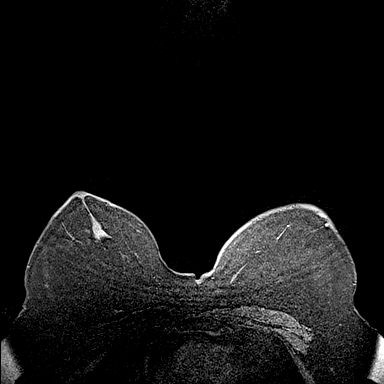
[im 144/144]
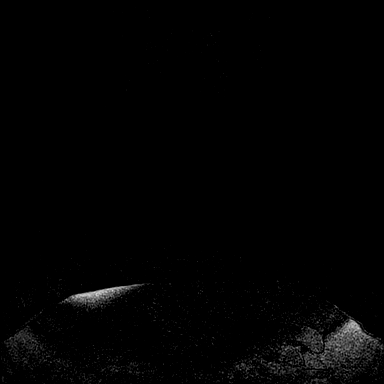

[Series 5: fl3d post-cm 20 · axial · 1.2mm · 0.99mm/px · z∈[-67,+105]mm · 5 of 144 slices shown (1 of 3)]
[im 1/144]
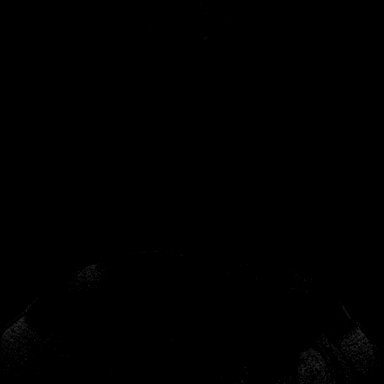
[im 36/144]
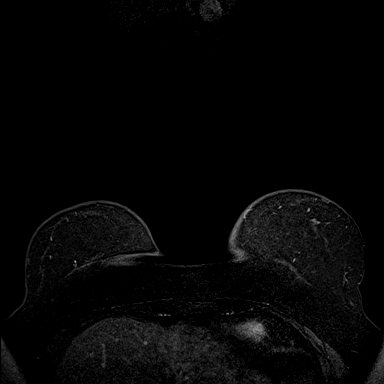
[im 72/144]
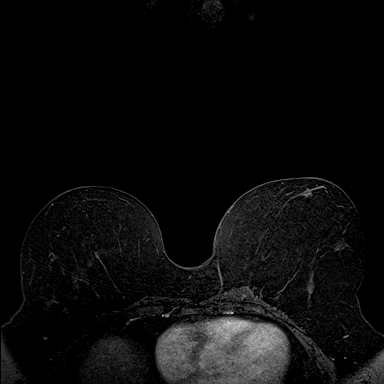
[im 108/144]
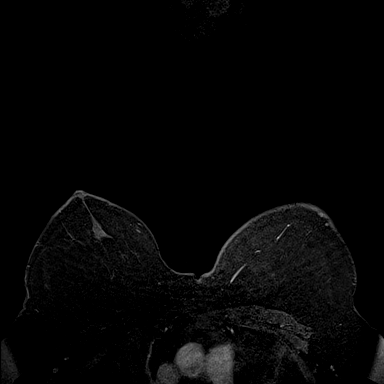
[im 144/144]
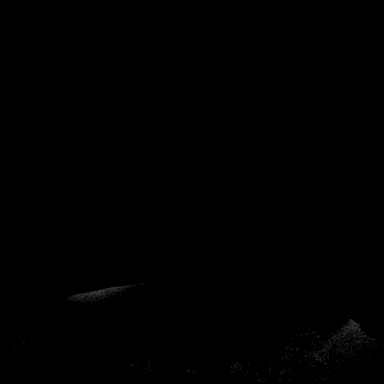

[Series 6: fl3d post-cm 20 · axial · 1.2mm · 0.99mm/px · z∈[-67,+105]mm · 5 of 144 slices shown (2 of 3)]
[im 1/144]
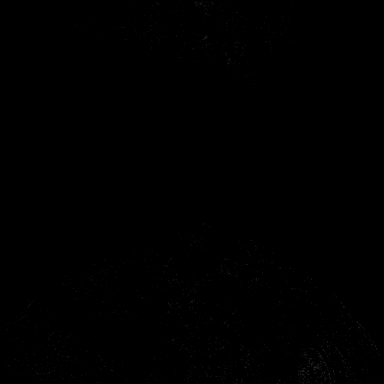
[im 36/144]
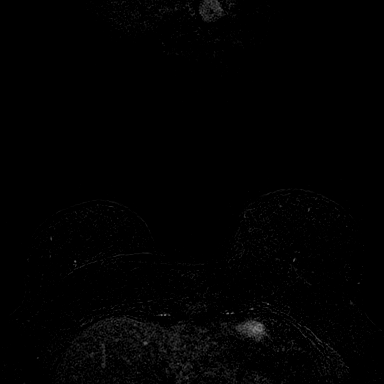
[im 72/144]
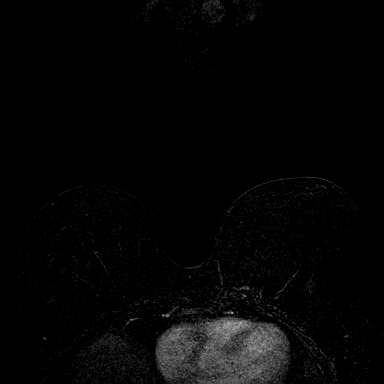
[im 108/144]
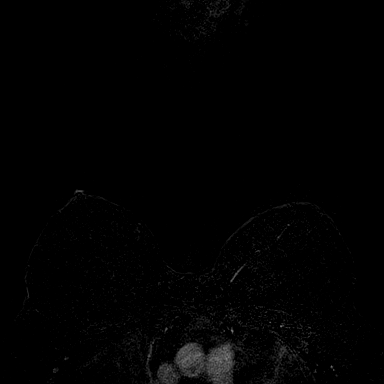
[im 144/144]
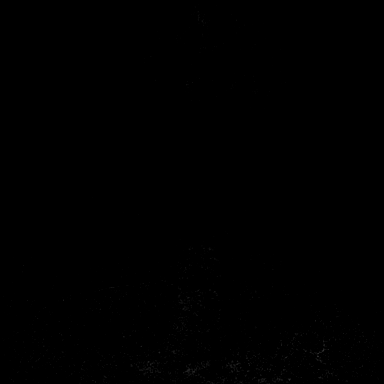

[Series 7: fl3d post-cm 20 · axial · 172.8mm · 0.99mm/px · 1 of 1 slices shown (3 of 3)]
[im 1/1]
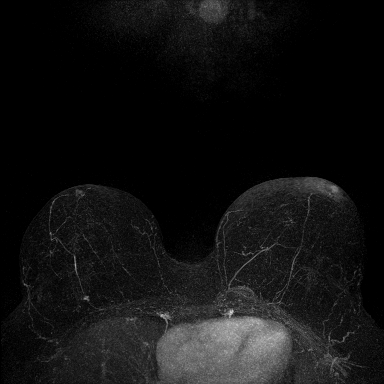

[Series 8: fl3d post-cm 3min · axial · 1.2mm · 0.99mm/px · z∈[-67,+105]mm · 6 of 144 slices shown]
[im 1/144]
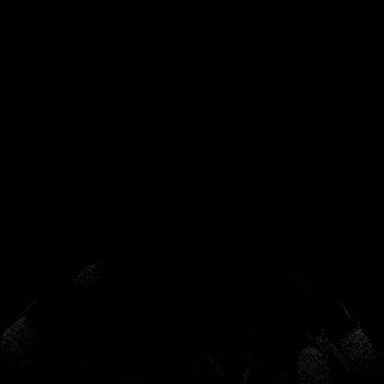
[im 29/144]
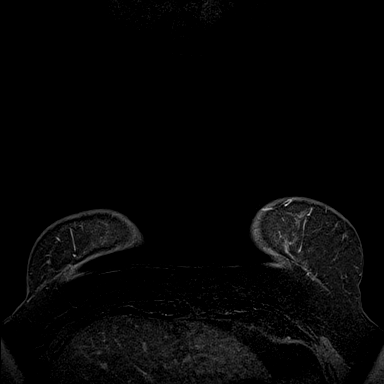
[im 58/144]
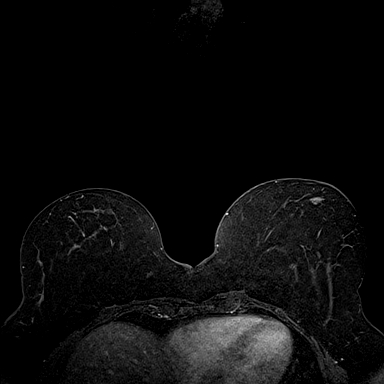
[im 86/144]
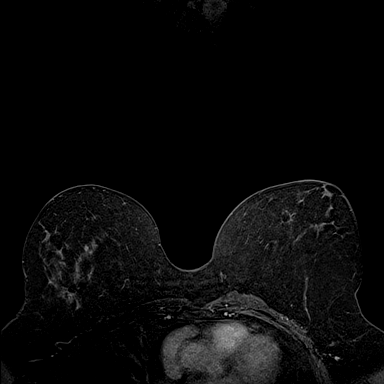
[im 115/144]
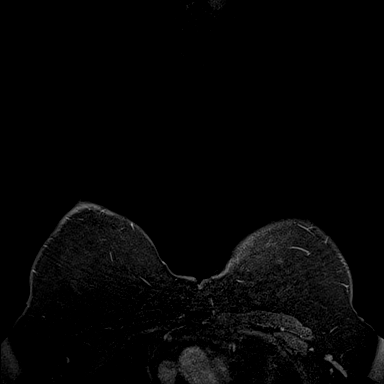
[im 144/144]
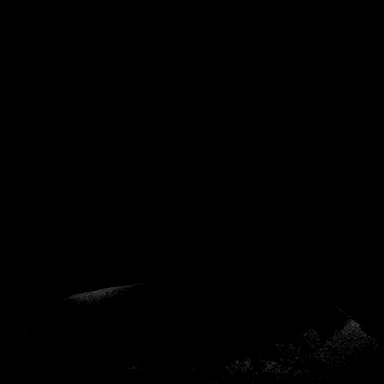

[Series 9: fl3d post-cm 3min_sub · axial · 1.2mm · 0.99mm/px · z∈[-67,+35]mm · 4 of 144 slices shown]
[im 1/144]
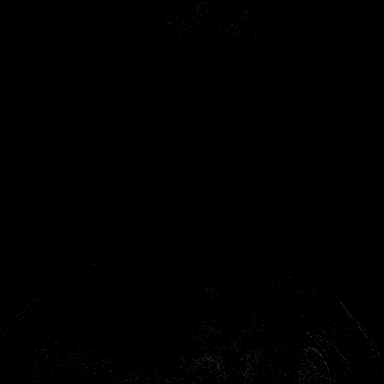
[im 29/144]
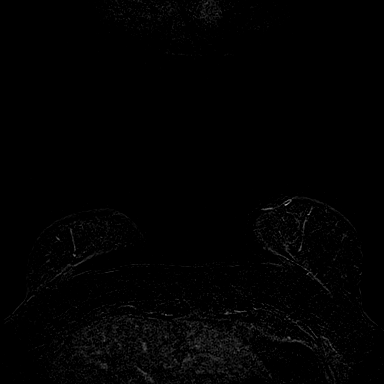
[im 58/144]
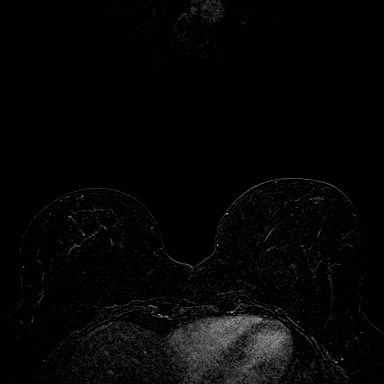
[im 86/144]
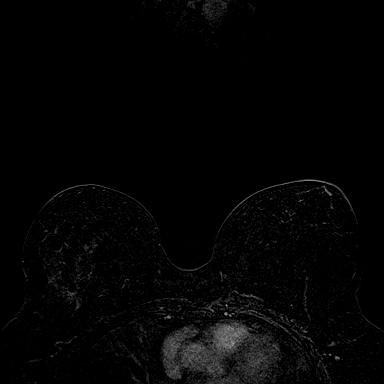

[32 of 48 positions shown; findings below may reference images not displayed]

Three-dimensional MR images were rendered by post-processing of the
original MR data on an independent workstation. The
three-dimensional MR images were interpreted, and findings are
reported in the following complete MRI report for this study. Three
dimensional images were evaluated at the independent interpreting
workstation using the DynaCAD thin client.
FINDINGS: Breast composition: b. Scattered fibroglandular tissue.

Background parenchymal enhancement: Mild

Right breast: New irregular enhancing mass within the lower outer
quadrant of the RIGHT breast, at posterior depth, measuring 1 cm,
with mixed enhancement kinetics including suspicious washout (series
9, image 80).

Two biopsy site markers are seen within the upper-outer quadrant of
the RIGHT breast, at posterior depth, a separate from the new
enhancing mass, corresponding to the earlier benign biopsy sites.

No additional new suspicious enhancing mass, suspicious non-mass
enhancement or secondary signs of malignancy elsewhere within the
RIGHT breast.

Left breast: No suspicious enhancing mass, non-mass enhancement or
secondary signs of malignancy.

Lymph nodes: No enlarged or morphologically abnormal lymph nodes are
seen within the bilateral axillary regions or internal mammary chain
regions.

Ancillary findings:  None.
IMPRESSION: 1. New irregular enhancing mass within the lower outer quadrant of
the RIGHT breast, at posterior depth, measuring 1 cm, with
suspicious washout kinetics (series 9, image 80). MRI-guided biopsy
is recommended to exclude malignancy.
2. Biopsy site markers within the upper-outer quadrant of the RIGHT
breast, corresponding to earlier benign biopsy sites.
3. No evidence of malignancy within the LEFT breast.

RECOMMENDATION:
MRI-guided biopsy for the new irregular enhancing mass within the
lower outer quadrant of the RIGHT breast, at posterior depth,
measuring 1 cm.

BI-RADS CATEGORY  4: Suspicious.

## 2021-04-14 MED ORDER — GADOBUTROL 1 MMOL/ML IV SOLN
10.0000 mL | Freq: Once | INTRAVENOUS | Status: AC | PRN
  Administered 2021-04-14: 10 mL via INTRAVENOUS

## 2021-04-18 ENCOUNTER — Other Ambulatory Visit: Payer: Self-pay | Admitting: Obstetrics and Gynecology

## 2021-04-18 DIAGNOSIS — R9389 Abnormal findings on diagnostic imaging of other specified body structures: Secondary | ICD-10-CM

## 2021-04-26 ENCOUNTER — Other Ambulatory Visit: Payer: Self-pay

## 2021-04-26 ENCOUNTER — Ambulatory Visit
Admission: RE | Admit: 2021-04-26 | Discharge: 2021-04-26 | Disposition: A | Discharge status: Home / Self Care | Payer: Managed Care, Other (non HMO) | Source: Ambulatory Visit | Attending: Obstetrics and Gynecology | Admitting: Obstetrics and Gynecology

## 2021-04-26 DIAGNOSIS — R9389 Abnormal findings on diagnostic imaging of other specified body structures: Secondary | ICD-10-CM

## 2021-04-26 LAB — HM MAMMOGRAPHY

## 2021-04-26 IMAGING — MR MR BREAST BX W/ LOC DEV 1ST LEASION IMAGE BX SPEC MR GUIDE*R*
9 of 12 series · 34 of 48 positions shown · IV contrast (8 ML GADAVIST)
Comparison: Previous exams.
COMPARISON: Previous exams.

Addendum:
CLINICAL DATA: 50-year-old female for tissue sampling of 1 cm
posterior slightly LOWER RIGHT breast mass.

EXAM:
MRI GUIDED CORE NEEDLE BIOPSY OF THE RIGHT BREAST
TECHNIQUE: Multiplanar, multisequence MR imaging of the RIGHT breast was
performed both before and after administration of intravenous
contrast.
CONTRAST:  8mL GADAVIST GADOBUTROL 1 MMOL/ML IV SOLN

[Series 2: fiducial unilateral · sagittal · 2.0mm · 1.33mm/px · 3 of 52 slices shown (1 of 2)]
[im 1/52]
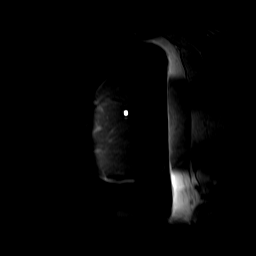
[im 26/52]
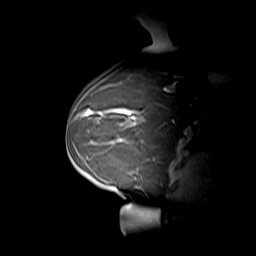
[im 52/52]
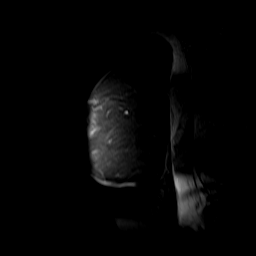

[Series 3: fiducial unilateral · sagittal · 2.0mm · 1.33mm/px · 3 of 60 slices shown (2 of 2)]
[im 1/60]
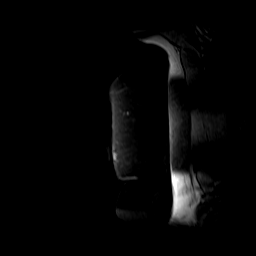
[im 30/60]
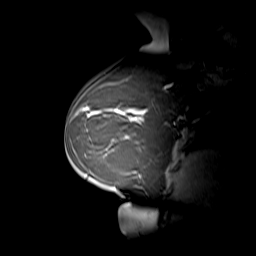
[im 60/60]
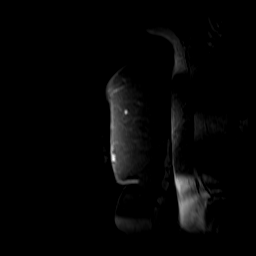

[Series 4: dynamic pre · axial · non-contrast · 1.3mm · 0.73mm/px · z∈[-50,+136]mm · 5 of 144 slices shown]
[im 1/144]
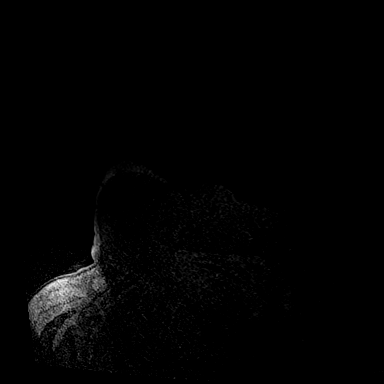
[im 36/144]
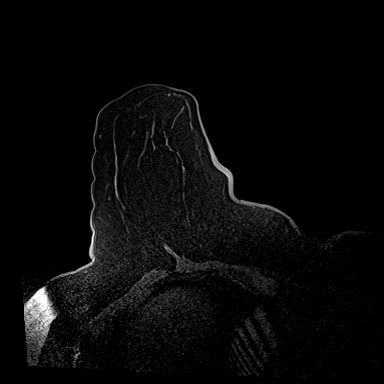
[im 72/144]
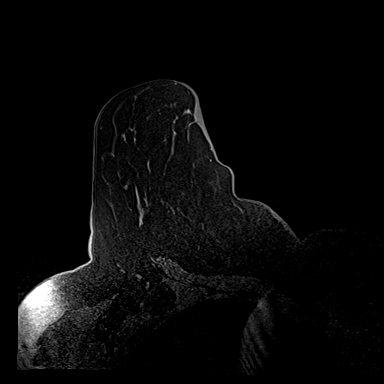
[im 108/144]
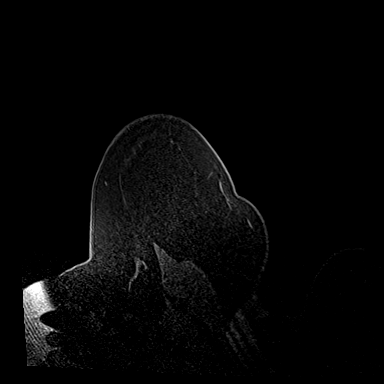
[im 144/144]
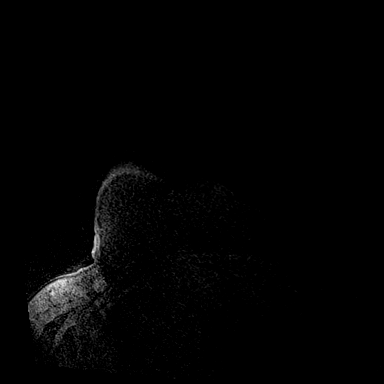

[Series 5: fl3d pre-cm non · axial · non-contrast · 1.2mm · 0.94mm/px · z∈[-43,+129]mm · 5 of 144 slices shown]
[im 1/144]
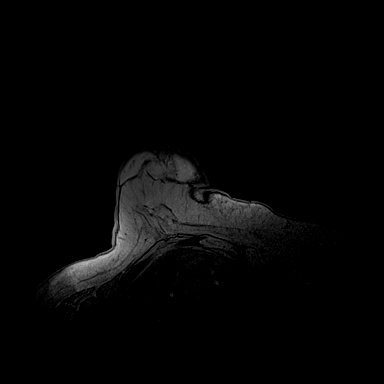
[im 36/144]
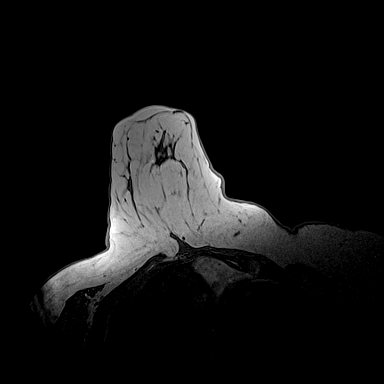
[im 72/144]
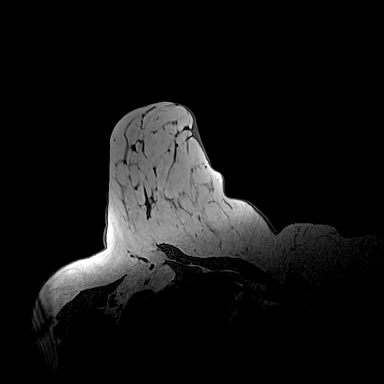
[im 108/144]
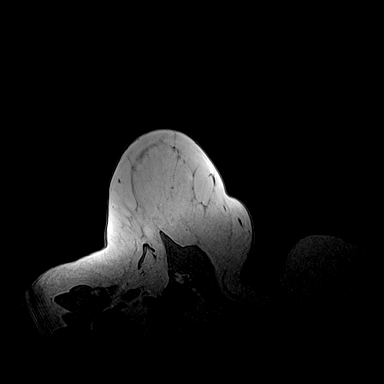
[im 144/144]
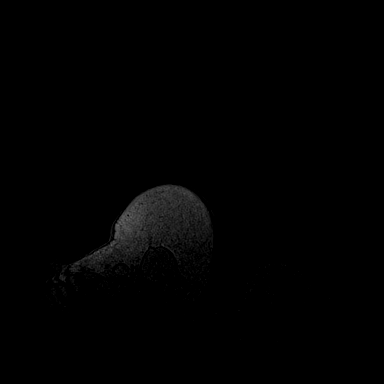

[Series 6: dynamic post 20 · axial · 1.3mm · 0.73mm/px · z∈[-50,+136]mm · 4 of 144 slices shown (1 of 2)]
[im 1/144]
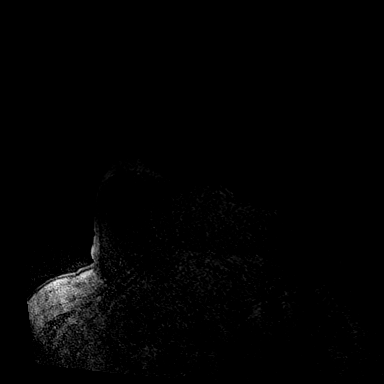
[im 48/144]
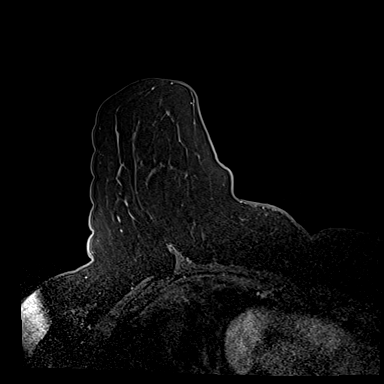
[im 96/144]
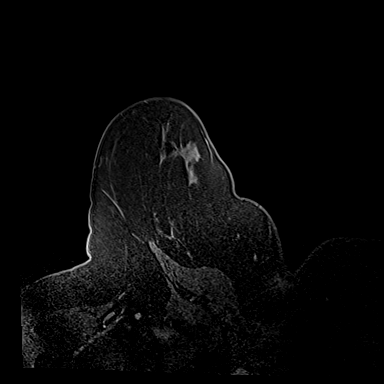
[im 144/144]
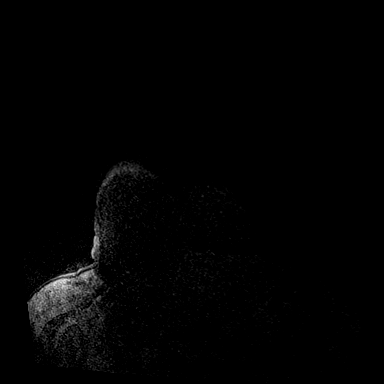

[Series 7: dynamic post 20 · axial · 1.3mm · 0.73mm/px · z∈[-50,+136]mm · 4 of 144 slices shown (2 of 2)]
[im 1/144]
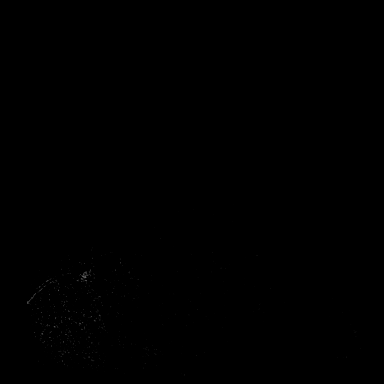
[im 48/144]
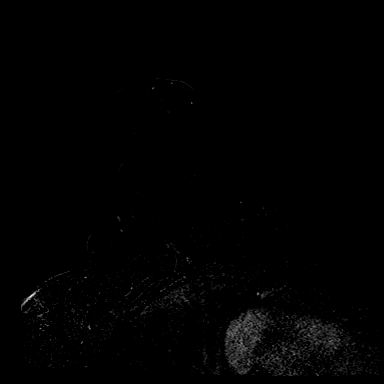
[im 96/144]
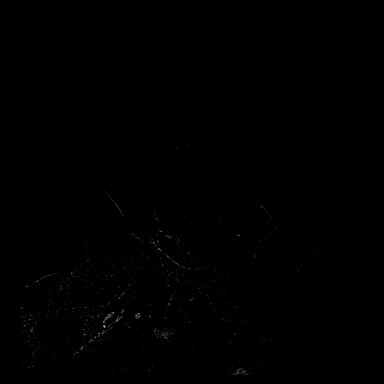
[im 144/144]
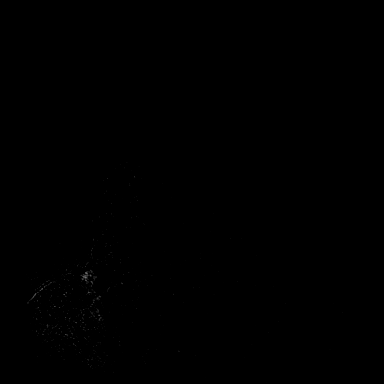

[Series 8: dynamic post 3 · axial · 1.3mm · 0.73mm/px · z∈[-50,+136]mm · 4 of 144 slices shown (1 of 2)]
[im 1/144]
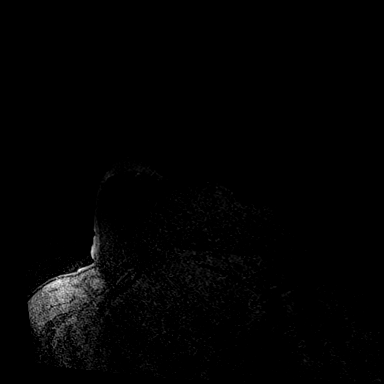
[im 48/144]
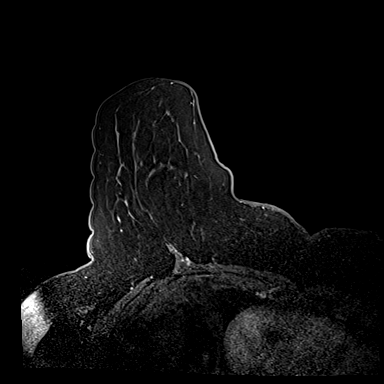
[im 96/144]
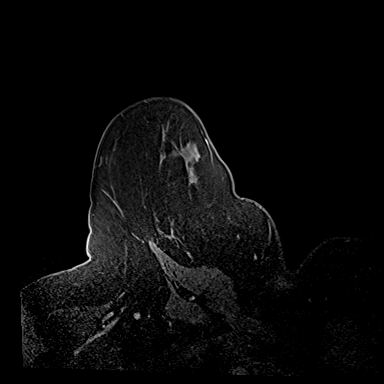
[im 144/144]
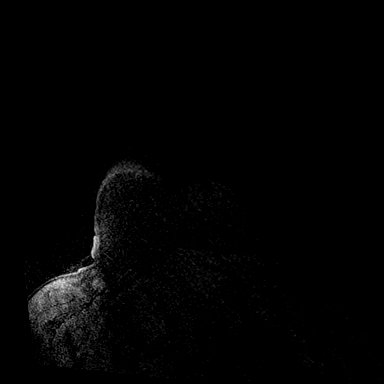

[Series 9: dynamic post 3 · axial · 1.3mm · 0.73mm/px · z∈[-50,+136]mm · 4 of 144 slices shown (2 of 2)]
[im 1/144]
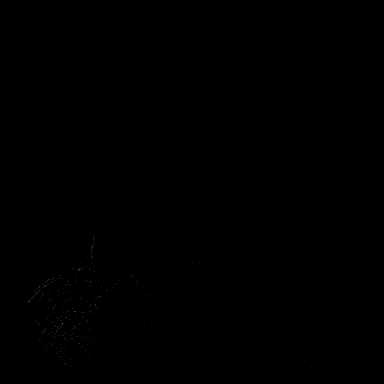
[im 48/144]
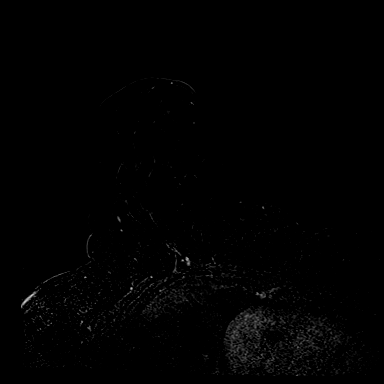
[im 96/144]
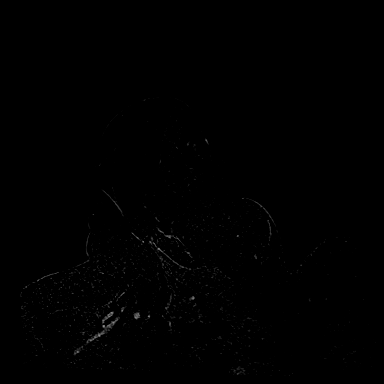
[im 144/144]
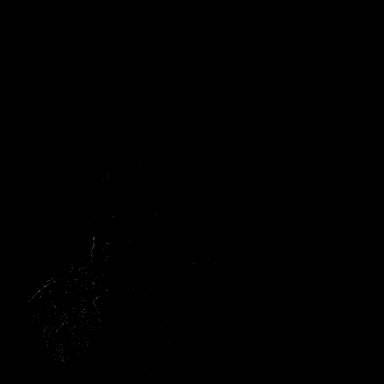

[Series 10: needle confirmation · axial · 1.3mm · 0.73mm/px · z∈[-50,+11]mm · 2 of 144 slices shown]
[im 1/144]
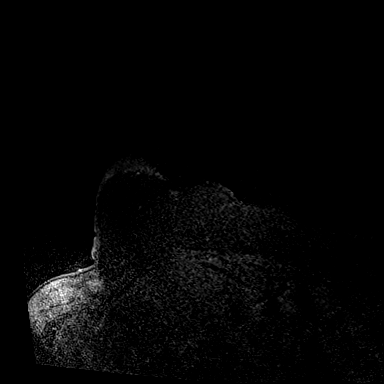
[im 48/144]
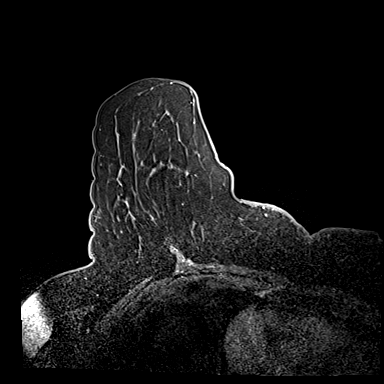

[34 of 48 positions shown; findings below may reference images not displayed]

FINDINGS: I met with the patient, and we discussed the procedure of MRI guided
biopsy, including risks, benefits, and alternatives. Specifically,
we discussed the risks of infection, bleeding, tissue injury, clip
migration, and inadequate sampling. Informed, written consent was
given. The usual time out protocol was performed immediately prior
to the procedure.

Using sterile technique, 1% Lidocaine with and without epinephrine,
MRI guidance, and a 9 gauge vacuum assisted device, biopsy was
performed of the 1 cm mass within the posterior slightly LOWER RIGHT
breast using a LATERAL approach.

At the conclusion of the procedure, a BARBELL tissue marker clip was
deployed into the biopsy cavity. Please note that this is the 2nd
BARBELL clip placed in the RIGHT breast as no other different shaped
clip was available.

Follow-up 2-view mammogram was performed and dictated separately.
IMPRESSION: MRI guided biopsy of 1 cm posterior slightly LOWER RIGHT breast
mass. No apparent complications.

ADDENDUM:
Pathology revealed FAT NECROSIS- NO MALIGNANCY IDENTIFIED of the
RIGHT breast, posterior, lower, barbell clip. This was found to be
concordant by Dr. MARIPILY.

Pathology results were discussed with the patient by telephone with
MARIPILY RN. The patient reported doing well after the biopsy
with tenderness and some bleeding at the site. Post biopsy
instructions and care were reviewed and questions were answered. The
patient was encouraged to call [REDACTED] for any additional concerns.

Bilateral breast MRI recommended in 6 months per protocol.

Pathology results reported by MARIPILY RN on [DATE].

*** End of Addendum ***
FINDINGS: I met with the patient, and we discussed the procedure of MRI guided
biopsy, including risks, benefits, and alternatives. Specifically,
we discussed the risks of infection, bleeding, tissue injury, clip
migration, and inadequate sampling. Informed, written consent was
given. The usual time out protocol was performed immediately prior
to the procedure.

Using sterile technique, 1% Lidocaine with and without epinephrine,
MRI guidance, and a 9 gauge vacuum assisted device, biopsy was
performed of the 1 cm mass within the posterior slightly LOWER RIGHT
breast using a LATERAL approach.

At the conclusion of the procedure, a BARBELL tissue marker clip was
deployed into the biopsy cavity. Please note that this is the 2nd
BARBELL clip placed in the RIGHT breast as no other different shaped
clip was available.

Follow-up 2-view mammogram was performed and dictated separately.
IMPRESSION: MRI guided biopsy of 1 cm posterior slightly LOWER RIGHT breast
mass. No apparent complications.

## 2021-04-26 IMAGING — MG MM BREAST LOCALIZATION CLIP
6 series · 6 of 18 positions shown · non-contrast
Comparison: Previous exam(s).

CLINICAL DATA: Evaluate BARBELL clip placement following MR guided
RIGHT breast biopsy. Please note that this is the 2nd BARBELL clip
placed in the RIGHT breast as no other different shaped clip was
available.

EXAM:
3D DIAGNOSTIC RIGHT MAMMOGRAM POST MRI BIOPSY

[R CC synth-2D]
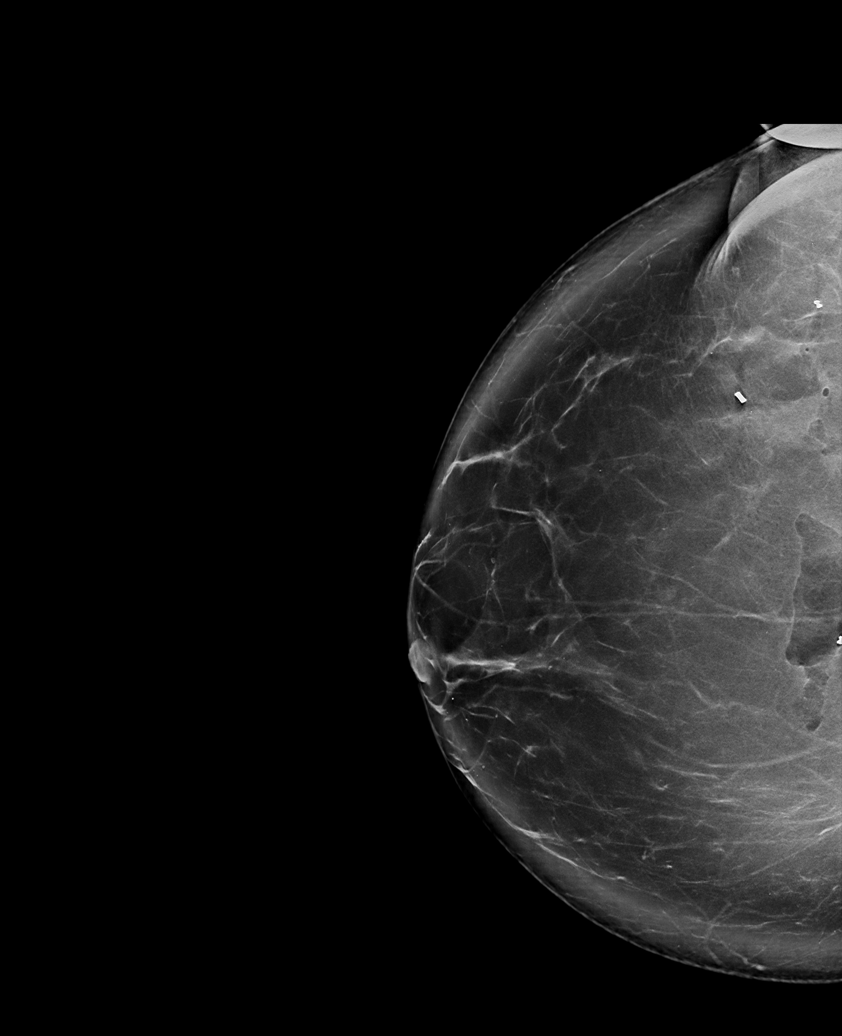

[R ML synth-2D]
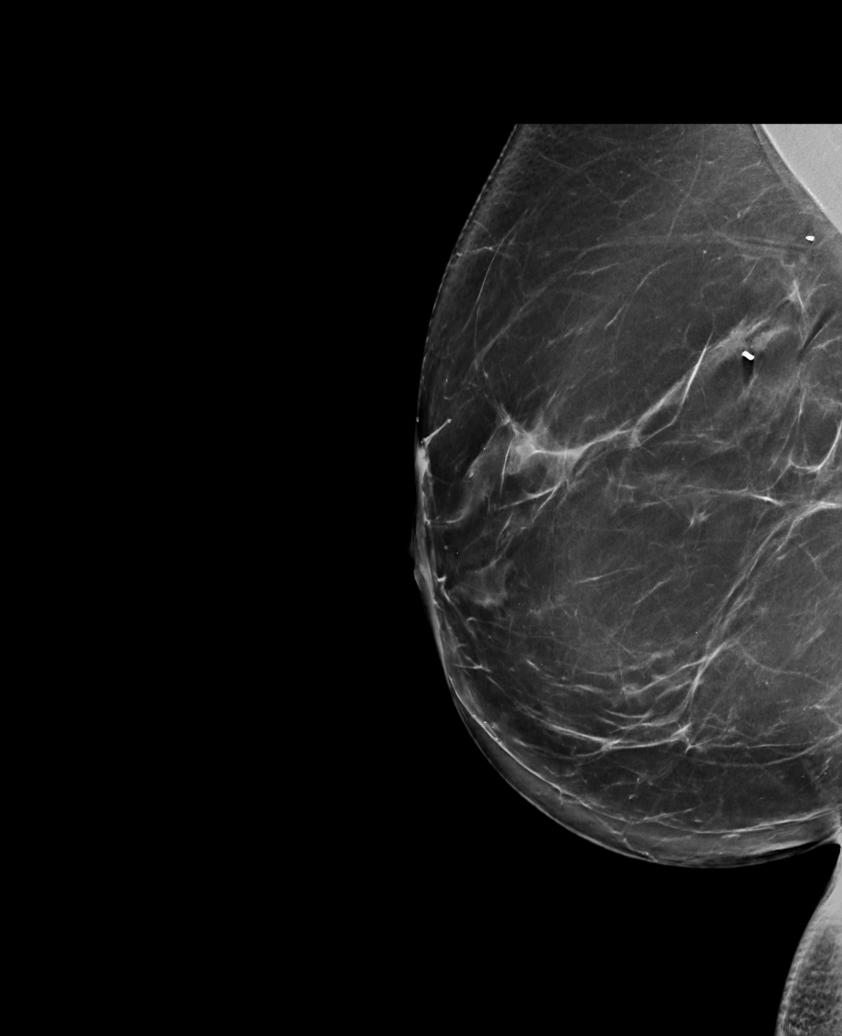

[R MLO synth-2D]
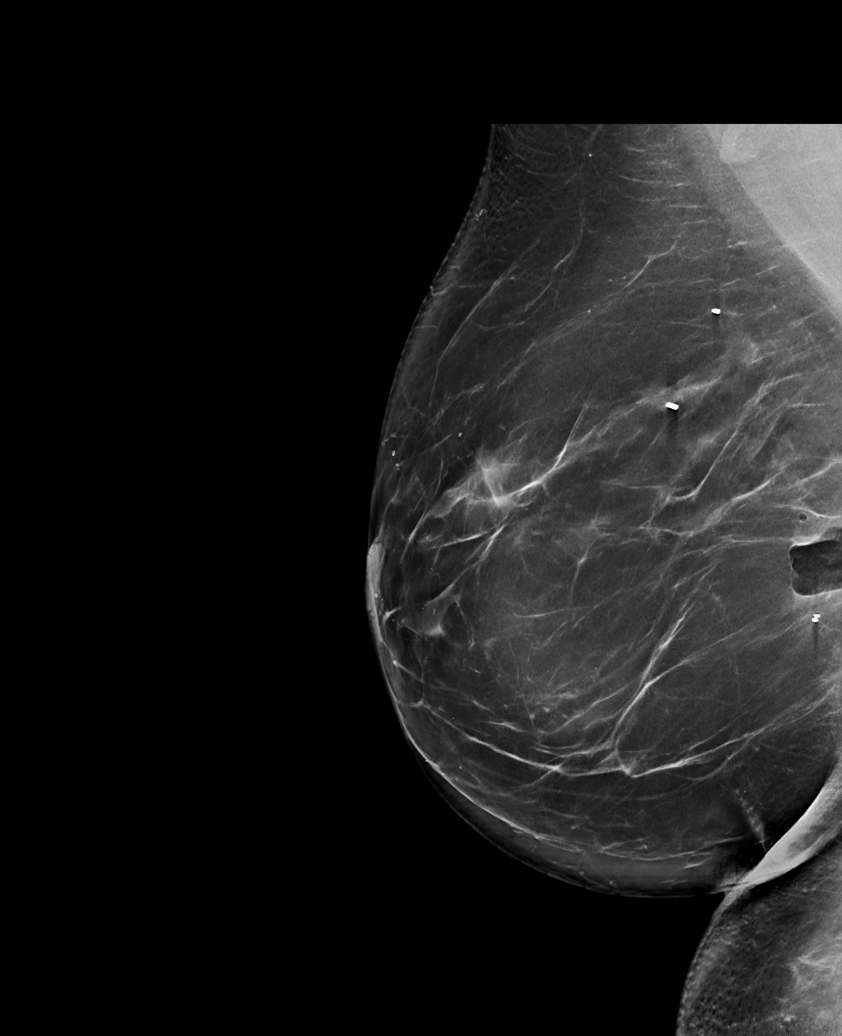

[R ML tomo · tomo slice 49/98.0]
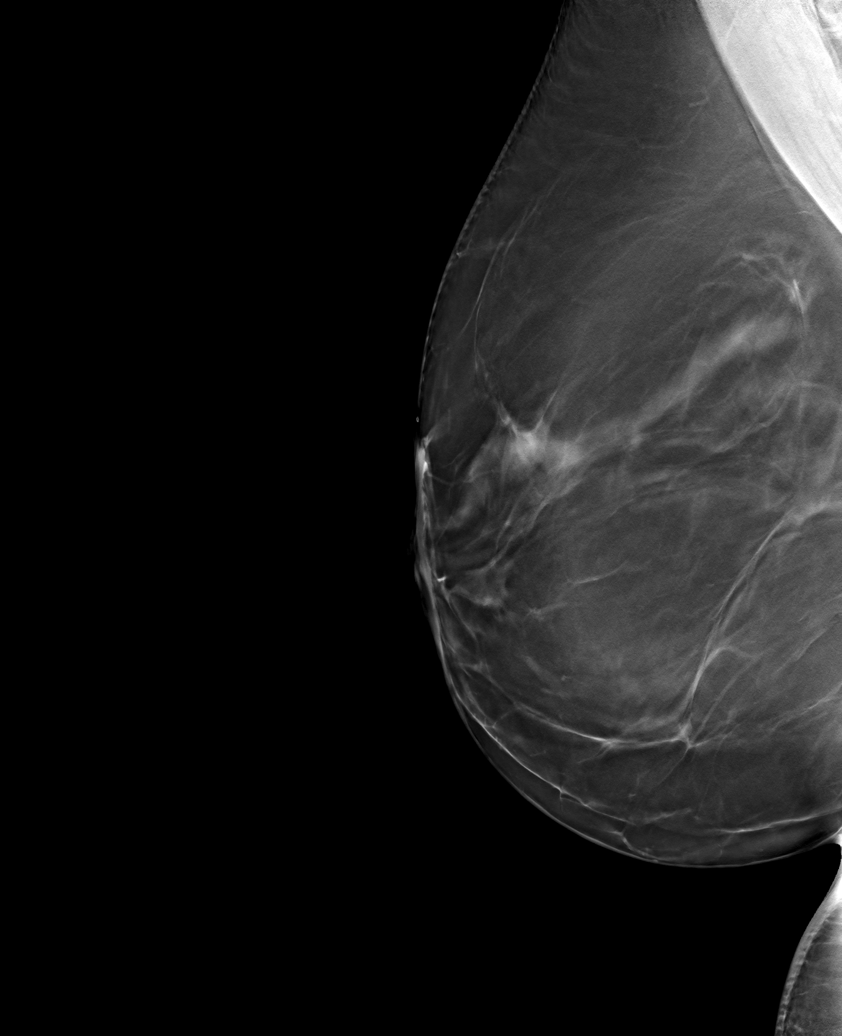

[R MLO tomo · tomo slice 54/107.0]
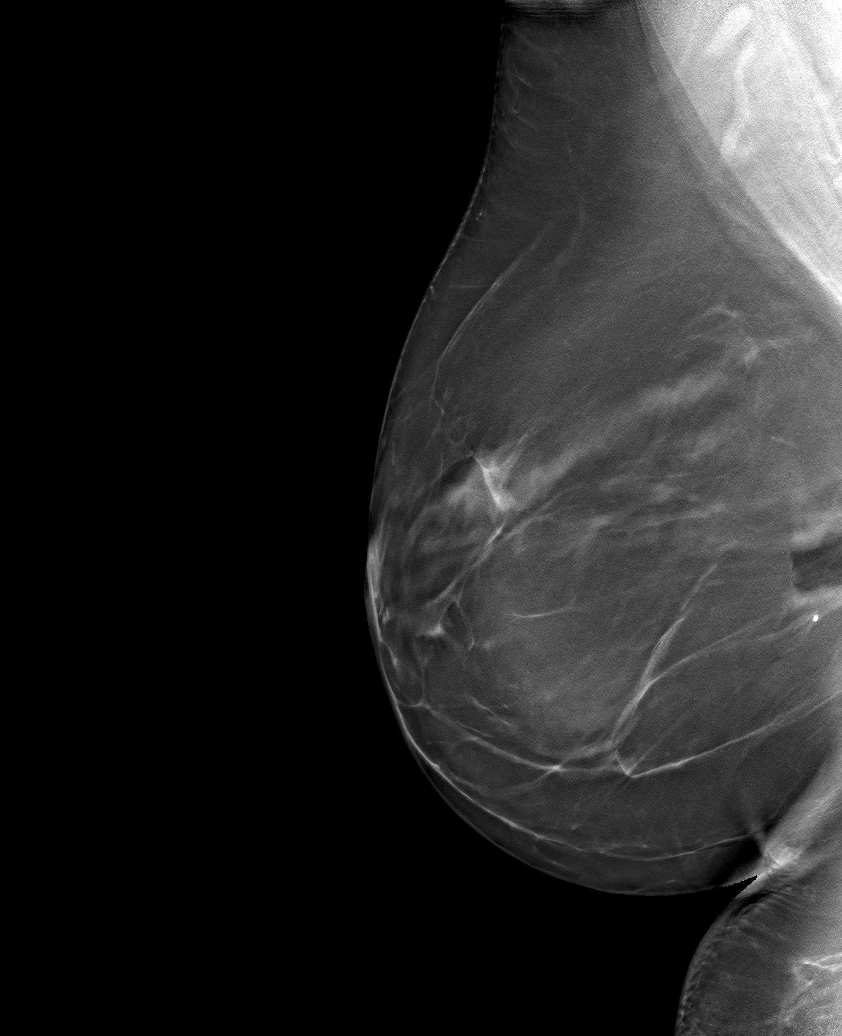

[R CC tomo · tomo slice 54/107.0]
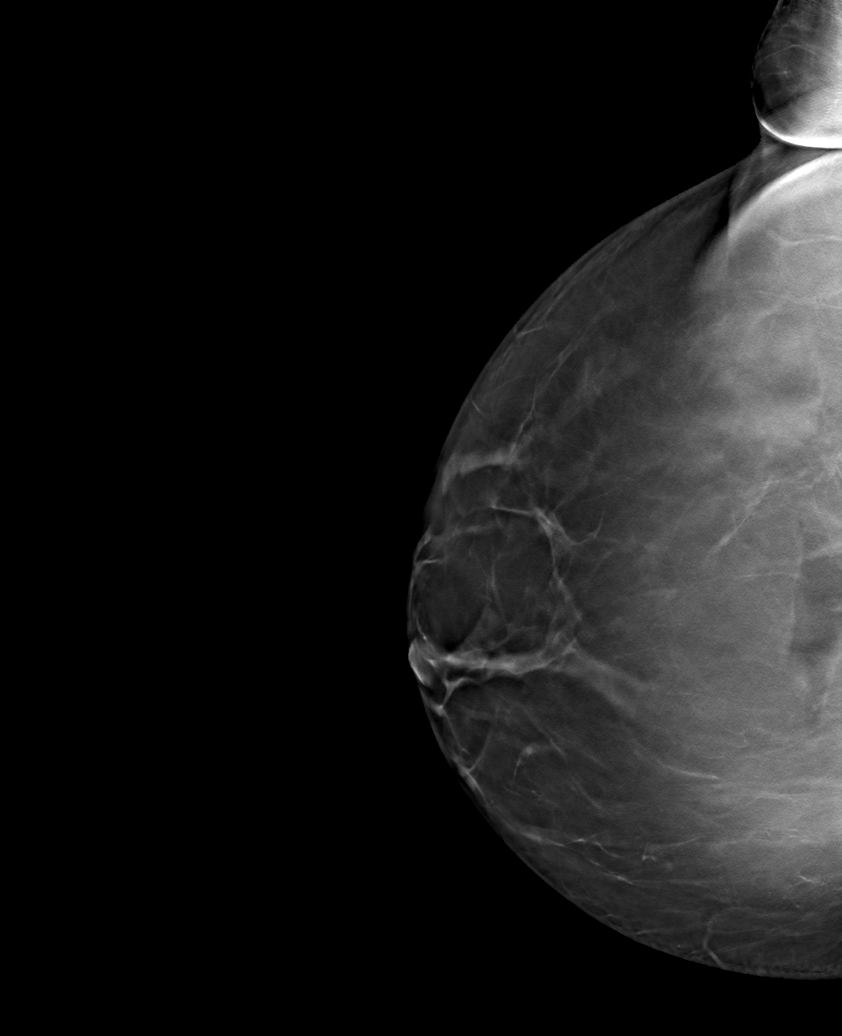

[6 of 18 positions shown; findings below may reference images not displayed]

FINDINGS: 3D Mammographic images were obtained following MRI guided biopsy of
the 1 cm mass in the posterior LOWER RIGHT breast. The BARBELL
biopsy marking clip is in expected position at the site of biopsy.
IMPRESSION: Appropriate positioning of the BARBELL shaped biopsy marking clip at
the site of biopsy in the posterior slightly LOWER RIGHT breast.
Please note that this is the 2nd BARBELL clip placed in the RIGHT
breast as no other different shaped clip was available.

Final Assessment: Post Procedure Mammograms for Marker Placement

## 2021-04-26 MED ORDER — GADOBUTROL 1 MMOL/ML IV SOLN
8.0000 mL | Freq: Once | INTRAVENOUS | Status: AC | PRN
  Administered 2021-04-26: 8 mL via INTRAVENOUS

## 2021-05-04 ENCOUNTER — Encounter: Payer: Self-pay | Admitting: Family Medicine

## 2021-05-23 ENCOUNTER — Other Ambulatory Visit (HOSPITAL_COMMUNITY): Payer: Self-pay

## 2021-05-23 ENCOUNTER — Encounter: Payer: Self-pay | Admitting: Family Medicine

## 2021-05-23 ENCOUNTER — Other Ambulatory Visit: Payer: Self-pay

## 2021-05-23 ENCOUNTER — Ambulatory Visit: Payer: Managed Care, Other (non HMO) | Admitting: Family Medicine

## 2021-05-23 VITALS — BP 120/78 | HR 80 | Temp 98.2°F | Ht 62.0 in | Wt 197.0 lb

## 2021-05-23 DIAGNOSIS — Z0000 Encounter for general adult medical examination without abnormal findings: Secondary | ICD-10-CM | POA: Diagnosis not present

## 2021-05-23 DIAGNOSIS — Z1211 Encounter for screening for malignant neoplasm of colon: Secondary | ICD-10-CM | POA: Diagnosis not present

## 2021-05-23 DIAGNOSIS — Z6834 Body mass index (BMI) 34.0-34.9, adult: Secondary | ICD-10-CM | POA: Insufficient documentation

## 2021-05-23 DIAGNOSIS — E119 Type 2 diabetes mellitus without complications: Secondary | ICD-10-CM | POA: Diagnosis not present

## 2021-05-23 DIAGNOSIS — E1169 Type 2 diabetes mellitus with other specified complication: Secondary | ICD-10-CM | POA: Diagnosis not present

## 2021-05-23 DIAGNOSIS — E782 Mixed hyperlipidemia: Secondary | ICD-10-CM | POA: Diagnosis not present

## 2021-05-23 DIAGNOSIS — J301 Allergic rhinitis due to pollen: Secondary | ICD-10-CM | POA: Insufficient documentation

## 2021-05-23 DIAGNOSIS — E669 Obesity, unspecified: Secondary | ICD-10-CM | POA: Diagnosis not present

## 2021-05-23 DIAGNOSIS — Z1212 Encounter for screening for malignant neoplasm of rectum: Secondary | ICD-10-CM

## 2021-05-23 DIAGNOSIS — Z6835 Body mass index (BMI) 35.0-35.9, adult: Secondary | ICD-10-CM | POA: Insufficient documentation

## 2021-05-23 DIAGNOSIS — Z6831 Body mass index (BMI) 31.0-31.9, adult: Secondary | ICD-10-CM | POA: Insufficient documentation

## 2021-05-23 DIAGNOSIS — Z975 Presence of (intrauterine) contraceptive device: Secondary | ICD-10-CM | POA: Diagnosis not present

## 2021-05-23 DIAGNOSIS — Z23 Encounter for immunization: Secondary | ICD-10-CM | POA: Diagnosis not present

## 2021-05-23 LAB — CBC WITH DIFFERENTIAL/PLATELET
Basophils Absolute: 0.1 10*3/uL (ref 0.0–0.1)
Basophils Relative: 1.3 % (ref 0.0–3.0)
Eosinophils Absolute: 0.4 10*3/uL (ref 0.0–0.7)
Eosinophils Relative: 5.5 % — ABNORMAL HIGH (ref 0.0–5.0)
HCT: 43.6 % (ref 36.0–46.0)
Hemoglobin: 14.8 g/dL (ref 12.0–15.0)
Lymphocytes Relative: 39.2 % (ref 12.0–46.0)
Lymphs Abs: 2.9 10*3/uL (ref 0.7–4.0)
MCHC: 34 g/dL (ref 30.0–36.0)
MCV: 89.5 fl (ref 78.0–100.0)
Monocytes Absolute: 0.6 10*3/uL (ref 0.1–1.0)
Monocytes Relative: 8.3 % (ref 3.0–12.0)
Neutro Abs: 3.3 10*3/uL (ref 1.4–7.7)
Neutrophils Relative %: 45.7 % (ref 43.0–77.0)
Platelets: 292 10*3/uL (ref 150.0–400.0)
RBC: 4.87 Mil/uL (ref 3.87–5.11)
RDW: 12.7 % (ref 11.5–15.5)
WBC: 7.3 10*3/uL (ref 4.0–10.5)

## 2021-05-23 LAB — COMPREHENSIVE METABOLIC PANEL
ALT: 73 U/L — ABNORMAL HIGH (ref 0–35)
AST: 37 U/L (ref 0–37)
Albumin: 4.5 g/dL (ref 3.5–5.2)
Alkaline Phosphatase: 74 U/L (ref 39–117)
BUN: 11 mg/dL (ref 6–23)
CO2: 27 mEq/L (ref 19–32)
Calcium: 9.9 mg/dL (ref 8.4–10.5)
Chloride: 102 mEq/L (ref 96–112)
Creatinine, Ser: 0.64 mg/dL (ref 0.40–1.20)
GFR: 103.74 mL/min (ref >60.00)
Glucose, Bld: 140 mg/dL — ABNORMAL HIGH (ref 70–99)
Potassium: 4.4 mEq/L (ref 3.5–5.1)
Sodium: 138 mEq/L (ref 135–145)
Total Bilirubin: 0.4 mg/dL (ref 0.2–1.2)
Total Protein: 7.4 g/dL (ref 6.0–8.3)

## 2021-05-23 LAB — LIPID PANEL
Cholesterol: 175 mg/dL (ref 0–200)
HDL: 61.4 mg/dL (ref >39.00)
LDL Cholesterol: 86 mg/dL (ref 0–99)
NonHDL: 113.21
Total CHOL/HDL Ratio: 3
Triglycerides: 134 mg/dL (ref 0.0–149.0)
VLDL: 26.8 mg/dL (ref 0.0–40.0)

## 2021-05-23 LAB — POCT GLYCOSYLATED HEMOGLOBIN (HGB A1C): Hemoglobin A1C: 7 % — AB (ref 4.0–5.6)

## 2021-05-23 LAB — MICROALBUMIN / CREATININE URINE RATIO
Creatinine,U: 102.6 mg/dL
Microalb Creat Ratio: 0.7 mg/g (ref 0.0–30.0)
Microalb, Ur: <0.7 mg/dL (ref 0.0–1.9)

## 2021-05-23 LAB — TSH: TSH: 0.99 u[IU]/mL (ref 0.35–5.50)

## 2021-05-23 MED ORDER — ROSUVASTATIN CALCIUM 5 MG PO TABS
5.0000 mg | ORAL_TABLET | Freq: Every evening | ORAL | 3 refills | Status: DC
Start: 2021-05-23 — End: 2021-10-09
  Filled 2021-05-23: qty 90, 90d supply, fill #0
  Filled 2021-09-04: qty 30, 30d supply, fill #1

## 2021-05-23 NOTE — Progress Notes (Signed)
Subjective  Chief Complaint  Patient presents with   Transitions Of Care   Diabetes    HPI: Cassidy Gibson is a 50 y.o. female who presents to [facility] at [place] today for a Female Wellness Visit. She also has the concerns and/or needs as listed above in the chief complaint. These will be addressed in addition to the Health Maintenance Visit.  New pt to me. Chart reviewed.  Wellness Visit: annual visit with health maintenance review and exam without Pap  HM: sees Dr. Julien Girt for female wellness and screens are up to date. Eligible for flu and prevnar 20. Due CRC screen. Avg risk.   Chronic disease f/u and/or acute problem visit: (deemed necessary to be done in addition to the wellness visit): Diabetes follow up: Her diabetic control is reported as Unchanged. Diet is fair although she is a carb lover. Little exercise. On met 1000 bid. Tolerates it well.  She denies exertional CP or SOB or symptomatic hypoglycemia. She denies foot sores or paresthesias. Not on ace; normotensive. Not on statin.  HLD: LDL 90s last year. Obesity Obesity: discussed diet at length, and exercise.   Immunization History  Administered Date(s) Administered   Hepatitis A, Adult 12/26/2003   Hepatitis B 12/26/2003   Influenza,inj,Quad PF,6+ Mos 08/16/2015, 06/28/2020, 05/23/2021   MMR 11/25/1976   Moderna SARS-COV2 Booster Vaccination 11/04/2020   PFIZER(Purple Top)SARS-COV-2 Vaccination 10/21/2019, 11/19/2019   Td 01/26/2004   Tdap 03/28/2007, 12/26/2014   Varicella 11/25/1976    Diabetes Related Lab Review: Lab Results  Component Value Date   HGBA1C 7.0 (A) 05/23/2021   HGBA1C 6.7 (A) 11/14/2020   HGBA1C 6.7 (A) 05/11/2020   HGBA1C 6.7 05/11/2020   HGBA1C 6.7 (A) 05/11/2020   HGBA1C 6.7 05/11/2020    Lab Results  Component Value Date   MICROALBUR 0.7 08/12/2015   Lab Results  Component Value Date   CREATININE 0.56 02/01/2020   BUN 15 02/01/2020   NA 137 02/01/2020   K 4.2  02/01/2020   CL 103 02/01/2020   CO2 28 02/01/2020   Lab Results  Component Value Date   CHOL 164 02/01/2020   CHOL 169 10/28/2017   CHOL 161 08/12/2015   Lab Results  Component Value Date   HDL 55.00 02/01/2020   HDL 58.50 10/28/2017   HDL 55.00 08/12/2015   Lab Results  Component Value Date   LDLCALC 92 02/01/2020   LDLCALC 94 10/28/2017   LDLCALC 92 08/12/2015   Lab Results  Component Value Date   TRIG 84.0 02/01/2020   TRIG 83.0 10/28/2017   TRIG 69.0 08/12/2015   Lab Results  Component Value Date   CHOLHDL 3 02/01/2020   CHOLHDL 3 10/28/2017   CHOLHDL 3 08/12/2015   No results found for: LDLDIRECT The 10-year ASCVD risk score (Arnett DK, et al., 2019) is: 1.5%   Values used to calculate the score:     Age: 28 years     Sex: Female     Is Non-Hispanic African American: No     Diabetic: Yes     Tobacco smoker: No     Systolic Blood Pressure: 888 mmHg     Is BP treated: No     HDL Cholesterol: 55 mg/dL     Total Cholesterol: 164 mg/dL  BP Readings from Last 3 Encounters:  05/23/21 120/78  11/14/20 112/74  08/11/20 136/88   Wt Readings from Last 3 Encounters:  05/23/21 197 lb (89.4 kg)  11/14/20 202  lb 6.4 oz (91.8 kg)  08/11/20 203 lb 12.8 oz (92.4 kg)    Health Maintenance  Topic Date Due   HIV Screening  Never done   Hepatitis C Screening  Never done   URINE MICROALBUMIN  08/11/2016   COLONOSCOPY (Pts 45-74yr Insurance coverage will need to be confirmed)  Never done   HEMOGLOBIN A1C  11/20/2021   OPHTHALMOLOGY EXAM  03/27/2022   MAMMOGRAM  04/26/2022   FOOT EXAM  05/23/2022   PAP SMEAR-Modifier  01/08/2023   TETANUS/TDAP  12/25/2024   INFLUENZA VACCINE  Completed   COVID-19 Vaccine  Completed   HPV VACCINES  Aged Out     Assessment  1. Annual physical exam   2. Type 2 diabetes mellitus without complication, without long-term current use of insulin ([place]   3. Combined hyperlipidemia associated with type 2 diabetes mellitus (HCC)   4.  Obesity (BMI 30-39.9)   5. IUD (intrauterine device) in place   6. Seasonal allergic rhinitis due to pollen   7. Need for immunization against influenza   8. Screening for colorectal cancer      Plan  Female Wellness Visit: Age appropriate Health Maintenance and Prevention measures were discussed with patient. Included topics are cancer screening recommendations, ways to keep healthy (see AVS) including dietary and exercise recommendations, regular eye and dental care, use of seat belts, and avoidance of moderate alcohol use and tobacco use. Refer to colonoscopy BMI: discussed patient's BMI and encouraged positive lifestyle modifications to help get to or maintain a target BMI. HM needs and immunizations were addressed and ordered. See below for orders. See HM and immunization section for updates. Flu and prevnar 20 today Routine labs and screening tests ordered including cmp, cbc and lipids where appropriate. Discussed recommendations regarding Vit D and calcium supplementation (see AVS)  Chronic disease management visit and/or acute problem visit: DM: fair control. Pt elects diet changes and exercise and weight loss with metformin 1000 bid and recheck in 3 months. Check urine and labs today. Flu and prevnar updated HLD: educated. Start crestor 5 nighlty.  Obesity: discussed better eating: focus on less processed foods, more grains, veggies.  Conitnue loratadine  Follow up: 3 mo diabetes and lipid recheck  Orders Placed This Encounter  Procedures   Flu Vaccine QUAD 666moM (Fluarix, Fluzone & Alfiuria Quad PF)   CBC with Differential/Platelet   Comprehensive metabolic panel   Lipid panel   TSH   Microalbumin / creatinine urine ratio   Ambulatory referral to Gastroenterology   POCT HgB A1C   Meds ordered this encounter  Medications   rosuvastatin (CRESTOR) 5 MG tablet    Sig: Take 1 tablet (5 mg total) by mouth at bedtime.    Dispense:  90 tablet    Refill:  3      Body mass  index is 36.03 kg/m.    Patient Active Problem List   Diagnosis Date Noted   Obesity (BMI 30-39.9) 05/23/2021    Priority: High   Combined hyperlipidemia associated with type 2 diabetes mellitus ([facility]09/27/2022    Priority: High   Type 2 diabetes mellitus without complication, without long-term current use of insulin ([facility]06/11/2019    Priority: High    dx'd 01/2020    IUD (intrauterine device) in place 05/23/2021    Priority: Medium    Mirena 10/2020    Seasonal allergic rhinitis due to pollen 05/23/2021    Priority: Low     We updated and reviewed the patient's  past history in detail and it is documented below. Allergies: Patient has No Known Allergies. Past Medical History Patient  has a past medical history of Allergy and Prediabetes. Past Surgical History Patient  has a past surgical history that includes Cesarean section (2001); Adenoidectomy (1978); Breast reduction surgery (1998); and Tympanostomy tube placement (1979). Family History: Patient family history includes Breast cancer in her mother; Cancer in her mother; Diabetes in her father, maternal aunt, maternal grandmother, maternal uncle, paternal aunt, paternal grandmother, and paternal uncle; GER disease in her mother; Heart disease in her brother and paternal grandfather; Hyperlipidemia in her brother; Hypertension in her brother and father. Social History:  Patient  reports that she has never smoked. She has never used smokeless tobacco. She reports that she does not drink alcohol and does not use drugs.  Review of Systems: Constitutional: negative for fever or malaise Ophthalmic: negative for photophobia, double vision or loss of vision Cardiovascular: negative for chest pain, dyspnea on exertion, or new LE swelling Respiratory: negative for SOB or persistent cough Gastrointestinal: negative for abdominal pain, change in bowel habits or melena Genitourinary: negative for dysuria or gross hematuria, no  abnormal uterine bleeding or disharge Musculoskeletal: negative for new gait disturbance or muscular weakness Integumentary: negative for new or persistent rashes, no breast lumps Neurological: negative for TIA or stroke symptoms Psychiatric: negative for SI or delusions Allergic/Immunologic: negative for hives  Patient Care Team    Relationship Specialty Notifications Start End  Leamon Arnt, MD PCP - General Family Medicine  05/23/21   Himmelrich, Bryson Ha, RD (Inactive) Dietitian   08/18/11   Marylynn Pearson, MD Consulting Physician Obstetrics and Gynecology  05/23/21     Objective  Vitals: BP 120/78   Pulse 80   Temp 98.2 F (36.8 C) (Temporal)   Ht 5' 2"  (1.575 m)   Wt 197 lb (89.4 kg)   SpO2 95%   BMI 36.03 kg/m  General:  Well developed, well nourished, no acute distress  Psych:  Alert and orientedx3,normal mood and affect HEENT:  Normocephalic, atraumatic, non-icteric sclera,  supple neck without adenopathy, mass or thyromegaly Cardiovascular:  Normal S1, S2, RRR without gallop, rub or murmur Respiratory:  Good breath sounds bilaterally, CTAB with normal respiratory effort Gastrointestinal: normal bowel sounds, soft, non-tender, no noted masses. No HSM MSK: no deformities, contusions. Joints are without erythema or swelling.  Skin:  Warm, no rashes or suspicious lesions noted Neurologic:    Mental status is normal.  Gross motor and sensory exams are normal. Normal gait. No tremor Diabetic Foot Exam: Appearance - no lesions, ulcers or calluses Skin - no sigificant pallor or erythema Monofilament testing - sensitive bilaterally in following locations:  Right - Great toe, medial, central, lateral ball and posterior foot intact  Left - Great toe, medial, central, lateral ball and posterior foot intact Pulses - +2 distally bilaterally   Commons side effects, risks, benefits, and alternatives for medications and treatment plan prescribed today were discussed, and the  patient expressed understanding of the given instructions. Patient is instructed to call or message via MyChart if he/she has any questions or concerns regarding our treatment plan. No barriers to understanding were identified. We discussed Red Flag symptoms and signs in detail. Patient expressed understanding regarding what to do in case of urgent or emergency type symptoms.  Medication list was reconciled, printed and provided to the patient in AVS. Patient instructions and summary information was reviewed with the patient as documented in the AVS. This  note was prepared with assistance of Systems analyst. Occasional wrong-word or sound-a-like substitutions may have occurred due to the inherent limitations of voice recognition software  This visit occurred during the SARS-CoV-2 public health emergency.  Safety protocols were in place, including screening questions prior to the visit, additional usage of staff PPE, and extensive cleaning of exam room while observing appropriate contact time as indicated for disinfecting solutions.

## 2021-05-23 NOTE — Addendum Note (Signed)
Addended by: Gean Birchwood on: 05/23/2021 01:14 PM   Modules accepted: Orders

## 2021-05-23 NOTE — Patient Instructions (Signed)
Please return in 3 months for diabetes follow up   I will release your lab results to you on your MyChart account with further instructions. Please reply with any questions.    Please start the crestor to lower your cholesterol.   Today you were given your flu and pneumonia vaccinations.    Start walking! And eat well!   It was a pleasure meeting you today! Thank you for choosing [place] to meet your healthcare needs! I truly look forward to working with you. If you have any questions or concerns, please send me a message via Mychart or call the office at [phone].

## 2021-05-24 ENCOUNTER — Encounter: Payer: Self-pay | Admitting: Family Medicine

## 2021-05-24 DIAGNOSIS — K760 Fatty (change of) liver, not elsewhere classified: Secondary | ICD-10-CM

## 2021-05-24 DIAGNOSIS — R748 Abnormal levels of other serum enzymes: Secondary | ICD-10-CM | POA: Insufficient documentation

## 2021-05-24 HISTORY — DX: Fatty (change of) liver, not elsewhere classified: K76.0

## 2021-05-26 ENCOUNTER — Other Ambulatory Visit (HOSPITAL_COMMUNITY): Payer: Self-pay

## 2021-05-26 ENCOUNTER — Telehealth (INDEPENDENT_AMBULATORY_CARE_PROVIDER_SITE_OTHER): Payer: Managed Care, Other (non HMO) | Admitting: Physician Assistant

## 2021-05-26 ENCOUNTER — Other Ambulatory Visit: Payer: Self-pay

## 2021-05-26 ENCOUNTER — Encounter: Payer: Self-pay | Admitting: Physician Assistant

## 2021-05-26 VITALS — Ht 62.0 in | Wt 197.0 lb

## 2021-05-26 DIAGNOSIS — T8090XA Unspecified complication following infusion and therapeutic injection, initial encounter: Secondary | ICD-10-CM | POA: Diagnosis not present

## 2021-05-26 MED ORDER — DOXYCYCLINE HYCLATE 100 MG PO TABS
100.0000 mg | ORAL_TABLET | Freq: Two times a day (BID) | ORAL | 0 refills | Status: DC
  Filled 2021-05-26: qty 20, 10d supply, fill #0

## 2021-05-26 NOTE — Progress Notes (Signed)
Virtual Visit via Video   I connected with Cassidy Gibson on 05/26/21 at  2:30 PM EDT by a video enabled telemedicine application and verified that I am speaking with the correct person using two identifiers. Location patient: Home Location provider: [facility], Office Persons participating in the virtual visit: Isabeau S Mylie, [place], Anselmo Pickler, LPN   I discussed the limitations of evaluation and management by telemedicine and the availability of in person appointments. The patient expressed understanding and agreed to proceed.  I acted as a Education administrator for [employer], PA-C [employer], LPN   Subjective:   HPI:   Reaction to injection Pt was given Pneumococcal 20 on Tuesday in her Right arm. She said her arm is red and swollen at site. She has two knots one at site and one about 2 inches below site, redness  is spreading. Right arm is warm to touch. She has been using ice to area and taking Ibuprofen 600 mg.  Denies: fevers, chills, malaise, nausea, vomiting   ROS: See pertinent positives and negatives per HPI.  Patient Active Problem List   Diagnosis Date Noted   Elevated liver enzymes 05/24/2021   Hepatic steatosis 05/24/2021   IUD (intrauterine device) in place 05/23/2021   Seasonal allergic rhinitis due to pollen 05/23/2021   Obesity (BMI 30-39.9) 05/23/2021   Combined hyperlipidemia associated with type 2 diabetes mellitus ([place]) 05/23/2021   Type 2 diabetes mellitus without complication, without long-term current use of insulin ([place]) 01/29/2020    Social History   Tobacco Use   Smoking status: Never   Smokeless tobacco: Never  Substance Use Topics   Alcohol use: No    Alcohol/week: 0.0 standard drinks    Current Outpatient Medications:    doxycycline (VIBRA-TABS) 100 MG tablet, Take 1 tablet (100 mg total) by mouth 2 (two) times daily., Disp: 20 tablet, Rfl: 0   fluticasone (FLONASE) 50 MCG/ACT nasal spray, Flonase Allergy Relief,  Disp: , Rfl:    levonorgestrel (MIRENA, 52 MG,) 20 MCG/24HR IUD, Mirena 20 mcg/24 hours (7 yrs) 52 mg intrauterine device  Take 1 device by intrauterine route., Disp: , Rfl:    Loratadine 10 MG CAPS, Claritin, Disp: , Rfl:    metFORMIN (GLUCOPHAGE) 1000 MG tablet, TAKE 1 TABLET BY MOUTH 2 TIMES DAILY WITH MEALS, Disp: 180 tablet, Rfl: 0   rosuvastatin (CRESTOR) 5 MG tablet, Take 1 tablet (5 mg total) by mouth at bedtime., Disp: 90 tablet, Rfl: 3   VITAMIN D PO, Take by mouth., Disp: , Rfl:   No Known Allergies  Objective:   VITALS: Per patient if applicable, see vitals. GENERAL: Alert, appears well and in no acute distress. HEENT: Atraumatic, conjunctiva clear, no obvious abnormalities on inspection of external nose and ears. NECK: Normal movements of the head and neck. CARDIOPULMONARY: No increased WOB. Speaking in clear sentences. I:E ratio WNL.  MS: Moves all visible extremities without noticeable abnormality. PSYCH: Pleasant and cooperative, well-groomed. Speech normal rate and rhythm. Affect is appropriate. Insight and judgement are appropriate. Attention is focused, linear, and appropriate.  NEURO: CN grossly intact. Oriented as arrived to appointment on time with no prompting. Moves both UE equally.  SKIN: R upper lateral arm with obvious erythema with slight streaking to inner arm  Assessment and Plan:   Cassidy Gibson was seen today for reaction to injection.  Diagnoses and all orders for this visit:  Injection site reaction, initial encounter  Other orders -     doxycycline (VIBRA-TABS)  100 MG tablet; Take 1 tablet (100 mg total) by mouth 2 (two) times daily.  Suspect possible early cellulitis Start oral doxycycline Continue ibuprofen and cool compresses Recommend taking photo or using marker to monitor progress Discussed worsening signs of infection and when to seek in person/urgent care  I discussed the assessment and treatment plan with the patient. The patient was provided  an opportunity to ask questions and all were answered. The patient agreed with the plan and demonstrated an understanding of the instructions.   The patient was advised to call back or seek an in-person evaluation if the symptoms worsen or if the condition fails to improve as anticipated.   CMA or LPN served as scribe during this visit. History, Physical, and Plan performed by medical provider. The above documentation has been reviewed and is accurate and complete.   [place], [place] 05/26/2021

## 2021-05-28 ENCOUNTER — Encounter (HOSPITAL_COMMUNITY): Payer: Self-pay

## 2021-05-28 ENCOUNTER — Observation Stay (HOSPITAL_COMMUNITY)
Admission: EM | Admit: 2021-05-28 | Discharge: 2021-05-29 | Disposition: A | Discharge status: Home / Self Care | Payer: Managed Care, Other (non HMO) | Attending: Internal Medicine | Admitting: Internal Medicine

## 2021-05-28 ENCOUNTER — Other Ambulatory Visit: Payer: Self-pay

## 2021-05-28 DIAGNOSIS — Z7984 Long term (current) use of oral hypoglycemic drugs: Secondary | ICD-10-CM | POA: Diagnosis not present

## 2021-05-28 DIAGNOSIS — L03113 Cellulitis of right upper limb: Secondary | ICD-10-CM | POA: Diagnosis not present

## 2021-05-28 DIAGNOSIS — L0390 Cellulitis, unspecified: Secondary | ICD-10-CM | POA: Diagnosis present

## 2021-05-28 DIAGNOSIS — E119 Type 2 diabetes mellitus without complications: Secondary | ICD-10-CM | POA: Diagnosis not present

## 2021-05-28 DIAGNOSIS — Z79899 Other long term (current) drug therapy: Secondary | ICD-10-CM | POA: Diagnosis not present

## 2021-05-28 DIAGNOSIS — Z20822 Contact with and (suspected) exposure to covid-19: Secondary | ICD-10-CM | POA: Diagnosis not present

## 2021-05-28 LAB — RESP PANEL BY RT-PCR (FLU A&B, COVID) ARPGX2
Influenza A by PCR: NEGATIVE
Influenza B by PCR: NEGATIVE
SARS Coronavirus 2 by RT PCR: NEGATIVE

## 2021-05-28 LAB — CBC WITH DIFFERENTIAL/PLATELET
Abs Immature Granulocytes: 0.02 10*3/uL (ref 0.00–0.07)
Basophils Absolute: 0.1 10*3/uL (ref 0.0–0.1)
Basophils Relative: 1 %
Eosinophils Absolute: 0.7 10*3/uL — ABNORMAL HIGH (ref 0.0–0.5)
Eosinophils Relative: 10 %
HCT: 45.1 % (ref 36.0–46.0)
Hemoglobin: 15 g/dL (ref 12.0–15.0)
Immature Granulocytes: 0 %
Lymphocytes Relative: 31 %
Lymphs Abs: 2 10*3/uL (ref 0.7–4.0)
MCH: 30.1 pg (ref 26.0–34.0)
MCHC: 33.3 g/dL (ref 30.0–36.0)
MCV: 90.6 fL (ref 80.0–100.0)
Monocytes Absolute: 0.6 10*3/uL (ref 0.1–1.0)
Monocytes Relative: 10 %
Neutro Abs: 3 10*3/uL (ref 1.7–7.7)
Neutrophils Relative %: 48 %
Platelets: 251 10*3/uL (ref 150–400)
RBC: 4.98 MIL/uL (ref 3.87–5.11)
RDW: 11.9 % (ref 11.5–15.5)
WBC: 6.3 10*3/uL (ref 4.0–10.5)
nRBC: 0 % (ref 0.0–0.2)

## 2021-05-28 LAB — BASIC METABOLIC PANEL
Anion gap: 6 (ref 5–15)
BUN: 16 mg/dL (ref 6–20)
CO2: 28 mmol/L (ref 22–32)
Calcium: 9.3 mg/dL (ref 8.9–10.3)
Chloride: 103 mmol/L (ref 98–111)
Creatinine, Ser: 0.62 mg/dL (ref 0.44–1.00)
GFR, Estimated: >60 mL/min (ref >60)
Glucose, Bld: 143 mg/dL — ABNORMAL HIGH (ref 70–99)
Potassium: 4.3 mmol/L (ref 3.5–5.1)
Sodium: 137 mmol/L (ref 135–145)

## 2021-05-28 LAB — GLUCOSE, CAPILLARY
Glucose-Capillary: 163 mg/dL — ABNORMAL HIGH (ref 70–99)
Glucose-Capillary: 129 mg/dL — ABNORMAL HIGH (ref 70–99)

## 2021-05-28 LAB — LACTIC ACID, PLASMA: Lactic Acid, Venous: 0.9 mmol/L (ref 0.5–1.9)

## 2021-05-28 MED ORDER — ROSUVASTATIN CALCIUM 5 MG PO TABS
5.0000 mg | ORAL_TABLET | Freq: Every day | ORAL | Status: DC
  Administered 2021-05-28: 5 mg via ORAL
  Filled 2021-05-28: qty 1

## 2021-05-28 MED ORDER — HYDROCODONE-ACETAMINOPHEN 5-325 MG PO TABS
1.0000 | ORAL_TABLET | ORAL | Status: DC | PRN
  Administered 2021-05-28: 1 via ORAL
  Filled 2021-05-28: qty 2

## 2021-05-28 MED ORDER — LORATADINE 10 MG PO TABS: 10.0000 mg | ORAL_TABLET | Freq: Every day | ORAL | Status: DC | PRN

## 2021-05-28 MED ORDER — INSULIN ASPART 100 UNIT/ML IJ SOLN
0.0000 [IU] | Freq: Three times a day (TID) | INTRAMUSCULAR | Status: DC
  Administered 2021-05-28: 3 [IU] via SUBCUTANEOUS
  Administered 2021-05-29 (×2): 2 [IU] via SUBCUTANEOUS
  Administered 2021-05-29: 3 [IU] via SUBCUTANEOUS

## 2021-05-28 MED ORDER — FLUTICASONE PROPIONATE 50 MCG/ACT NA SUSP
2.0000 | Freq: Every day | NASAL | Status: DC | PRN
  Filled 2021-05-28: qty 16

## 2021-05-28 MED ORDER — VANCOMYCIN HCL 750 MG/150ML IV SOLN
750.0000 mg | Freq: Two times a day (BID) | INTRAVENOUS | Status: DC
  Administered 2021-05-28 – 2021-05-29 (×2): 750 mg via INTRAVENOUS
  Filled 2021-05-28 (×2): qty 150

## 2021-05-28 MED ORDER — ENOXAPARIN SODIUM 40 MG/0.4ML IJ SOSY
40.0000 mg | PREFILLED_SYRINGE | INTRAMUSCULAR | Status: DC
  Administered 2021-05-28: 40 mg via SUBCUTANEOUS
  Filled 2021-05-28: qty 0.4

## 2021-05-28 MED ORDER — VANCOMYCIN HCL 1750 MG/350ML IV SOLN
1750.0000 mg | Freq: Once | INTRAVENOUS | Status: AC
  Administered 2021-05-28: 1750 mg via INTRAVENOUS
  Filled 2021-05-28: qty 350

## 2021-05-28 MED ORDER — ACETAMINOPHEN 650 MG RE SUPP: 650.0000 mg | Freq: Four times a day (QID) | RECTAL | Status: DC | PRN

## 2021-05-28 MED ORDER — ACETAMINOPHEN 325 MG PO TABS
650.0000 mg | ORAL_TABLET | Freq: Four times a day (QID) | ORAL | Status: DC | PRN
  Administered 2021-05-29: 650 mg via ORAL
  Filled 2021-05-28: qty 2

## 2021-05-28 MED ORDER — POLYVINYL ALCOHOL 1.4 % OP SOLN
1.0000 [drp] | Freq: Every day | OPHTHALMIC | Status: DC | PRN
  Filled 2021-05-28: qty 15

## 2021-05-28 NOTE — ED Provider Notes (Signed)
[facility] DEPT Provider Note   CSN: [id number] Arrival date & time: 05/28/21  2563     History Chief Complaint  Patient presents with   Cellulitis    Cassidy Gibson is a 50 y.o. female who presents with progressive cellulitis after pneumonia vaccine 5 days ago.  Patient initially had redness and swelling over the vaccine site, was placed on antibiotics for cellulitis by her PCP.  She has been taking this for the past 4 days and have noted increased redness and swelling since starting antibiotics.  She was told if symptoms continued after 4 days of antibiotics to be evaluated in the emergency department.  She denies fevers, chills, chest pain, shortness of breath, difficulty swallowing, nausea, vomiting.  HPI     Past Medical History:  Diagnosis Date   Allergy    Hepatic steatosis 05/24/2021   Noted initially on abd CT 2012   Prediabetes     Patient Active Problem List   Diagnosis Date Noted   Cellulitis 05/28/2021   Elevated liver enzymes 05/24/2021   Hepatic steatosis 05/24/2021   IUD (intrauterine device) in place 05/23/2021   Seasonal allergic rhinitis due to pollen 05/23/2021   Obesity (BMI 30-39.9) 05/23/2021   Combined hyperlipidemia associated with type 2 diabetes mellitus ([place]) 05/23/2021   Type 2 diabetes mellitus without complication, without long-term current use of insulin ([place]) 01/29/2020    Past Surgical History:  Procedure Laterality Date   ADENOIDECTOMY  1978   BREAST REDUCTION SURGERY  1998   CESAREAN SECTION  2001   TYMPANOSTOMY TUBE PLACEMENT  1979     OB History   No obstetric history on file.     Family History  Problem Relation Age of Onset   Breast cancer Mother    GER disease Mother    Cancer Mother        Breast   Heart disease Paternal Grandfather    Diabetes Father    Hypertension Father    Hyperlipidemia Brother    Hypertension Brother    Heart disease Brother    Diabetes Maternal Aunt     Diabetes Maternal Uncle    Diabetes Paternal Aunt    Diabetes Paternal Uncle    Diabetes Maternal Grandmother    Diabetes Paternal Grandmother     Social History   Tobacco Use   Smoking status: Never   Smokeless tobacco: Never  Vaping Use   Vaping Use: Never used  Substance Use Topics   Alcohol use: No    Alcohol/week: 0.0 standard drinks   Drug use: No    Home Medications Prior to Admission medications   Medication Sig Start Date End Date Taking? Authorizing Provider  Carboxymethylcellulose Sodium (ARTIFICIAL TEARS OP) Place 1 drop into both eyes daily as needed (dry eyes).   Yes [provider]  doxycycline (VIBRA-TABS) 100 MG tablet Take 1 tablet (100 mg total) by mouth 2 (two) times daily. 05/26/21  Yes Inda Coke, PA  fluticasone (FLONASE) 50 MCG/ACT nasal spray Place 2 sprays into both nostrils daily as needed for allergies.   Yes [provider]  ibuprofen (ADVIL) 200 MG tablet Take 600 mg by mouth every 6 (six) hours as needed for mild pain.   Yes [provider]  levonorgestrel (MIRENA, 52 MG,) 20 MCG/24HR IUD 1 each by Intrauterine route once.   Yes [provider]  Loratadine 10 MG CAPS Take 10 mg by mouth daily as needed.   Yes [provider]  metFORMIN (  GLUCOPHAGE) 1000 MG tablet TAKE 1 TABLET BY MOUTH 2 TIMES DAILY WITH MEALS Patient taking differently: Take 1,000 mg by mouth 2 (two) times daily with a meal. 04/03/21 04/03/22 Yes Leamon Arnt, MD  rosuvastatin (CRESTOR) 5 MG tablet Take 1 tablet (5 mg total) by mouth at bedtime. 05/23/21  Yes Leamon Arnt, MD  VITAMIN D PO Take 1 tablet by mouth daily.   Yes [provider]    Allergies    Patient has no known allergies.  Review of Systems   Review of Systems  Constitutional:  Negative for chills and fever.  HENT:  Negative for trouble swallowing.   Respiratory:  Negative for shortness of breath.   Cardiovascular:  Negative for chest pain.   Gastrointestinal:  Negative for abdominal pain, nausea and vomiting.  Skin:  Positive for color change.       Progressive redness over right arm  All other systems reviewed and are negative.  Physical Exam Updated Vital Signs BP 137/78   Pulse 68   Temp 98.6 F (37 C) (Oral)   Resp 16   Ht 5\' 2"  (1.575 m)   Wt 89.4 kg   SpO2 95%   BMI 36.03 kg/m   Physical Exam Vitals and nursing note reviewed.  Constitutional:      Appearance: Normal appearance.  HENT:     Head: Normocephalic and atraumatic.  Eyes:     Conjunctiva/sclera: Conjunctivae normal.  Pulmonary:     Effort: Pulmonary effort is normal. No respiratory distress.  Skin:    General: Skin is warm and dry.     Comments: Increased erythema, warmth, and tenderness over right lateral upper arm as documented in photo below. Full passive ROM of right shoulder and right elbow. Sensation in tact.   Neurological:     Mental Status: She is alert.  Psychiatric:        Mood and Affect: Mood normal.        Behavior: Behavior normal.        Innermost pen line from Friday 9/30, second peripheral line 10/1, third marker line in triage today 10/2   ED Results / Procedures / Treatments   Labs (all labs ordered are listed, but only abnormal results are displayed) Labs Reviewed  CBC WITH DIFFERENTIAL/PLATELET - Abnormal; Notable for the following components:      Result Value   Eosinophils Absolute 0.7 (*)    All other components within normal limits  BASIC METABOLIC PANEL - Abnormal; Notable for the following components:   Glucose, Bld 143 (*)    All other components within normal limits  RESP PANEL BY RT-PCR (FLU A&B, COVID) ARPGX2  CULTURE, BLOOD (ROUTINE X 2)  CULTURE, BLOOD (ROUTINE X 2)  LACTIC ACID, PLASMA    EKG None  Radiology No results found.  Procedures Procedures   Medications Ordered in ED Medications  vancomycin (VANCOREADY) IVPB 1750 mg/350 mL (1,750 mg Intravenous New Bag/Given 05/28/21 1055)     ED Course  I have reviewed the triage vital signs and the nursing notes.  Pertinent labs & imaging results that were available during my care of the patient were reviewed by me and considered in my medical decision making (see chart for details).    MDM Rules/Calculators/A&P                           Patient is 50 y/o female who presents with cellulitis with failure of outpatient treatment.  She had the pneumonia vaccine 5 days ago, began having redness and swelling at the injection site. Was prescribed doxycycline by her PCP for cellulitis and told to be evaluated in the ED if symptoms persisted after 4 days of antibiotics.  Patient presents today with worsening redness, swelling, and tenderness of her right upper arm with demarcated borders. She denies fever, chills, difficulty swallowing, chest pain, shortness of breath, nausea or vomiting.   Due to progressive cellulitis after failed outpatient antibiotic course, plan to obtain labs, start IV vancomycin and admit. Otherwise patient is stable, she is afebrile, lactic acid is 0.9, and blood cultures in process. COVID test negative.  Consulted hospitalist Dr. Marylyn Ishihara who accepted patient for admission.  Final Clinical Impression(s) / ED Diagnoses Final diagnoses:  Cellulitis of right upper extremity    Rx / DC Orders ED Discharge Orders     None        Kateri Plummer, PA-C 05/28/21 1233    Gareth Morgan, MD 05/29/21 0001

## 2021-05-28 NOTE — H&P (Signed)
History and Physical    Cassidy Gibson MVE:[id number] DOB: [birth year] DOA: 05/28/2021  PCP: Leamon Arnt, MD  Patient coming from: Home  Chief Complaint: rash  HPI: Cassidy Gibson is a 50 y.o. female with medical history significant of DM2, HLD. Presenting with RUE cellulitis. She reports that about 5 days ago she got a pneumonia vaccine at her PCP. The next day she had some erythema and edema over the injection site. She spoke with her PCP and doxycycline was prescribed. This area of edema and erythema has increased in size and has become painful. She spoke with her PCP again, and it was recommended that she come to the ED. She denies any other aggravating or alleviating factors.   ED Course: She was started on vancomycin. [facility] was called for admission.   Review of Systems:  Denies CP, dyspnea, palpitations, N/V/D, fevers. Review of systems is otherwise negative for all not mentioned in HPI.   PMHx Past Medical History:  Diagnosis Date   Allergy    Hepatic steatosis 05/24/2021   Noted initially on abd CT 2012   Prediabetes     PSHx Past Surgical History:  Procedure Laterality Date   ADENOIDECTOMY  1978   BREAST REDUCTION SURGERY  1998   CESAREAN SECTION  2001   TYMPANOSTOMY TUBE PLACEMENT  1979    SocHx  reports that she has never smoked. She has never used smokeless tobacco. She reports that she does not drink alcohol and does not use drugs.  No Known Allergies  FamHx Family History  Problem Relation Age of Onset   Breast cancer Mother    GER disease Mother    Cancer Mother        Breast   Heart disease Paternal Grandfather    Diabetes Father    Hypertension Father    Hyperlipidemia Brother    Hypertension Brother    Heart disease Brother    Diabetes Maternal Aunt    Diabetes Maternal Uncle    Diabetes Paternal Aunt    Diabetes Paternal Uncle    Diabetes Maternal Grandmother    Diabetes Paternal Grandmother     Prior to Admission medications    Medication Sig Start Date End Date Taking? Authorizing Provider  Carboxymethylcellulose Sodium (ARTIFICIAL TEARS OP) Place 1 drop into both eyes daily as needed (dry eyes).   Yes [provider]  doxycycline (VIBRA-TABS) 100 MG tablet Take 1 tablet (100 mg total) by mouth 2 (two) times daily. 05/26/21  Yes Inda Coke, PA  fluticasone (FLONASE) 50 MCG/ACT nasal spray Place 2 sprays into both nostrils daily as needed for allergies.   Yes [provider]  ibuprofen (ADVIL) 200 MG tablet Take 600 mg by mouth every 6 (six) hours as needed for mild pain.   Yes [provider]  levonorgestrel (MIRENA, 52 MG,) 20 MCG/24HR IUD 1 each by Intrauterine route once.   Yes [provider]  Loratadine 10 MG CAPS Take 10 mg by mouth daily as needed.   Yes [provider]  metFORMIN (GLUCOPHAGE) 1000 MG tablet TAKE 1 TABLET BY MOUTH 2 TIMES DAILY WITH MEALS Patient taking differently: Take 1,000 mg by mouth 2 (two) times daily with a meal. 04/03/21 04/03/22 Yes Leamon Arnt, MD  rosuvastatin (CRESTOR) 5 MG tablet Take 1 tablet (5 mg total) by mouth at bedtime. 05/23/21  Yes Leamon Arnt, MD  VITAMIN D PO Take 1 tablet by mouth daily.   Yes [provider]  Physical Exam: Vitals:   05/28/21 0907 05/28/21 0912 05/28/21 1045 05/28/21 1130  BP: (!) 132/91  140/79 137/78  Pulse: 89  72 68  Resp: 18  18 16   Temp: 98.6 F (37 C)     TempSrc: Oral     SpO2: 97%  96% 95%  Weight:  89.4 kg    Height:  5\' 2"  (1.575 m)      General: 50 y.o. female resting in bed in NAD Eyes: PERRL, normal sclera ENMT: Nares patent w/o discharge, orophaynx clear, dentition normal, ears w/o discharge/lesions/ulcers Neck: Supple, trachea midline Cardiovascular: RRR, +S1, S2, no m/g/r, equal pulses throughout Respiratory: CTABL, no w/r/r, normal WOB GI: BS+, NDNT, no masses noted, no organomegaly noted MSK: No c/c; RUE edema/erythema demarcated by black marker Skin: No  rashes, bruises, ulcerations noted Neuro: A&O x 3, no focal deficits Psyc: Appropriate interaction and affect, calm/cooperative  Labs on Admission: I have personally reviewed following labs and imaging studies  CBC: Recent Labs  Lab 05/23/21 1214 05/28/21 1042  WBC 7.3 6.3  NEUTROABS 3.3 3.0  HGB 14.8 15.0  HCT 43.6 45.1  MCV 89.5 90.6  PLT 292.0 161   Basic Metabolic Panel: Recent Labs  Lab 05/23/21 1214 05/28/21 1042  NA 138 137  K 4.4 4.3  CL 102 103  CO2 27 PENDING  GLUCOSE 140* 143*  BUN 11 16  CREATININE 0.64 0.62  CALCIUM 9.9 9.3   GFR: Estimated Creatinine Clearance: 88.4 mL/min (by C-G formula based on SCr of 0.62 mg/dL). Liver Function Tests: Recent Labs  Lab 05/23/21 1214  AST 37  ALT 73*  ALKPHOS 74  BILITOT 0.4  PROT 7.4  ALBUMIN 4.5   No results for input(s): LIPASE, AMYLASE in the last 168 hours. No results for input(s): AMMONIA in the last 168 hours. Coagulation Profile: No results for input(s): INR, PROTIME in the last 168 hours. Cardiac Enzymes: No results for input(s): CKTOTAL, CKMB, CKMBINDEX, TROPONINI in the last 168 hours. BNP (last 3 results) No results for input(s): PROBNP in the last 8760 hours. HbA1C: No results for input(s): HGBA1C in the last 72 hours. CBG: No results for input(s): GLUCAP in the last 168 hours. Lipid Profile: No results for input(s): CHOL, HDL, LDLCALC, TRIG, CHOLHDL, LDLDIRECT in the last 72 hours. Thyroid Function Tests: No results for input(s): TSH, T4TOTAL, FREET4, T3FREE, THYROIDAB in the last 72 hours. Anemia Panel: No results for input(s): VITAMINB12, FOLATE, FERRITIN, TIBC, IRON, RETICCTPCT in the last 72 hours. Urine analysis:    Component Value Date/Time   BILIRUBINUR neg 05/14/2011 1601   PROTEINUR 30 05/14/2011 1601   UROBILINOGEN 2 05/14/2011 1601   NITRITE neg 05/14/2011 1601   LEUKOCYTESUR neg 05/14/2011 1601    Radiological Exams on Admission: No results found.  EKG: None obtained  in ED  Assessment/Plan RUE cellulitis     - place in obs     - follow bld cx     - continue vanc     - pain control  DM2     - A1c was 7.0 a few days ago     - glucose checks, DM diet, SSI  HLD     - continue home statin  DVT prophylaxis: lovenox  Code Status: FULL  Family Communication: w/ dtr at bedside  Consults called: None   Status is: Observation  The patient remains OBS appropriate and will d/c before 2 midnights.  Dispo: The patient is from: Home  Anticipated d/c is to: Home              Patient currently is not medically stable to d/c.   Difficult to place patient No  Time spent coordinating admission: 45 minutes  [facility]  If 7PM-7AM, please contact night-coverage [email or web address]  05/28/2021, 12:16 PM

## 2021-05-28 NOTE — Progress Notes (Signed)
Pharmacy Antibiotic Note  Cassidy Gibson is a 50 y.o. female admitted on 05/28/2021 with cellulitis, failed Doxycycline tx as outpatient.  Pharmacy has been consulted for Vancomycin dosing.  Plan: Vancomycin 1750mg  x1, then 750mg  q12 for AUC 438, SCr 0.62, Vd 0.62  Height: 5\' 2"  (157.5 cm) Weight: 89.4 kg (197 lb) IBW/kg (Calculated) : 50.1  Temp (24hrs), Avg:98.3 F (36.8 C), Min:97.9 F (36.6 C), Max:98.6 F (37 C)  Recent Labs  Lab 05/23/21 1214 05/28/21 1042  WBC 7.3 6.3  CREATININE 0.64 0.62  LATICACIDVEN  --  0.9    Estimated Creatinine Clearance: 88.4 mL/min (by C-G formula based on SCr of 0.62 mg/dL).    No Known Allergies  Antimicrobials this admission: 10/2 Vancomycin >>   Dose adjustments this admission:  Microbiology results: 10/2 BCx: sent  Thank you for allowing pharmacy to be a part of this patient's care.  Minda Ditto PharmD 05/28/2021 2:23 PM

## 2021-05-28 NOTE — ED Triage Notes (Signed)
Patient reports that she received a pneumonia vaccine 5 days ago. Patient began having redness and swelling and was placed on antibiotics for cellulitis. Redness and swelling have increased since beginning the antibiotics. Area previously marked.

## 2021-05-28 NOTE — Progress Notes (Signed)
A consult was received from an ED physician for Vancomycin per pharmacy dosing.  The patient's profile has been reviewed for ht/wt/allergies/indication/available labs.    A one time order has been placed for Vancomycin 1750mg .  Further antibiotics/pharmacy consults should be ordered by admitting physician if indicated.                       Thank you,  Minda Ditto PharmD 05/28/2021  10:28 AM

## 2021-05-29 DIAGNOSIS — E1169 Type 2 diabetes mellitus with other specified complication: Secondary | ICD-10-CM | POA: Diagnosis not present

## 2021-05-29 DIAGNOSIS — E785 Hyperlipidemia, unspecified: Secondary | ICD-10-CM | POA: Diagnosis not present

## 2021-05-29 DIAGNOSIS — L03113 Cellulitis of right upper limb: Secondary | ICD-10-CM | POA: Diagnosis not present

## 2021-05-29 LAB — CBC
HCT: 41.5 % (ref 36.0–46.0)
Hemoglobin: 14.1 g/dL (ref 12.0–15.0)
MCH: 30.2 pg (ref 26.0–34.0)
MCHC: 34 g/dL (ref 30.0–36.0)
MCV: 88.9 fL (ref 80.0–100.0)
Platelets: 235 10*3/uL (ref 150–400)
RBC: 4.67 MIL/uL (ref 3.87–5.11)
RDW: 12 % (ref 11.5–15.5)
WBC: 7.3 10*3/uL (ref 4.0–10.5)
nRBC: 0 % (ref 0.0–0.2)

## 2021-05-29 LAB — COMPREHENSIVE METABOLIC PANEL
ALT: 61 U/L — ABNORMAL HIGH (ref 0–44)
AST: 33 U/L (ref 15–41)
Albumin: 3.7 g/dL (ref 3.5–5.0)
Alkaline Phosphatase: 57 U/L (ref 38–126)
Anion gap: 6 (ref 5–15)
BUN: 17 mg/dL (ref 6–20)
CO2: 26 mmol/L (ref 22–32)
Calcium: 8.9 mg/dL (ref 8.9–10.3)
Chloride: 106 mmol/L (ref 98–111)
Creatinine, Ser: 0.58 mg/dL (ref 0.44–1.00)
GFR, Estimated: >60 mL/min (ref >60)
Glucose, Bld: 141 mg/dL — ABNORMAL HIGH (ref 70–99)
Potassium: 3.9 mmol/L (ref 3.5–5.1)
Sodium: 138 mmol/L (ref 135–145)
Total Bilirubin: 0.5 mg/dL (ref 0.3–1.2)
Total Protein: 6.7 g/dL (ref 6.5–8.1)

## 2021-05-29 LAB — GLUCOSE, CAPILLARY
Glucose-Capillary: 143 mg/dL — ABNORMAL HIGH (ref 70–99)
Glucose-Capillary: 127 mg/dL — ABNORMAL HIGH (ref 70–99)
Glucose-Capillary: 184 mg/dL — ABNORMAL HIGH (ref 70–99)

## 2021-05-29 LAB — HIV ANTIBODY (ROUTINE TESTING W REFLEX): HIV Screen 4th Generation wRfx: NONREACTIVE

## 2021-05-29 MED ORDER — DEXTROSE 5 % IV SOLN
1500.0000 mg | Freq: Once | INTRAVENOUS | Status: AC
  Administered 2021-05-29: 1500 mg via INTRAVENOUS
  Filled 2021-05-29: qty 75

## 2021-05-29 NOTE — Discharge Summary (Signed)
Physician Discharge Summary  Cassidy Gibson OZD:[id number] DOB: 05-04-1971 DOA: 05/28/2021  PCP: Leamon Arnt, MD  Admit date: 05/28/2021 Discharge date: 05/29/2021  Admitted From: Home Disposition: Home  Recommendations for Outpatient Follow-up:  Patient will follow up with her PCP later this week She is also be contacted by [facility] for follow-up   Discharge Condition: Stable CODE STATUS: Full code Diet recommendation: Heart healthy  Brief/Interim Summary: 50 year old female with a history of diabetes, hyperlipidemia, recently had pneumonia vaccine in her right arm.  She subsequently developed erythema, edema over infection site.  She was prescribed a course of doxycycline by her primary care physician, but her erythema and edema continued to spread and became painful.  She came to the ER for evaluation.  She was started on vancomycin and was admitted for further treatments.  WBC count was noted to be normal.  Within 24 hours, there was some improvement noted in her overall swelling in her arm.  Erythema had began to clear.  She was afebrile.  WBC count remained normal.  The patient did receive a dose of IV dalbavancin.  I suspect that this should adequately treat her underlying infection since duration of action of 7 days.  She will plan to follow-up with her primary care physician later this week.  Per dalbavancin protocol, referral has been made to [facility] for follow-up as well.  Patient is agreeable to plan.  She was told to return to the emergency room if she had fevers, worsening erythema, pain or swelling in her arm.  Discharge Diagnoses:  Active Problems:   Cellulitis    Discharge Instructions  Discharge Instructions     Ambulatory referral to Infectious Disease   Complete by: As directed    Cellulitis patient:  Received dalbavancin on 05/29/2021.   Diet - low sodium heart healthy   Complete by: As directed    Increase activity slowly   Complete by: As directed        Allergies as of 05/29/2021   No Known Allergies      Medication List     STOP taking these medications    doxycycline 100 MG tablet Commonly known as: VIBRA-TABS       TAKE these medications    ARTIFICIAL TEARS OP Place 1 drop into both eyes daily as needed (dry eyes).   fluticasone 50 MCG/ACT nasal spray Commonly known as: FLONASE Place 2 sprays into both nostrils daily as needed for allergies.   ibuprofen 200 MG tablet Commonly known as: ADVIL Take 600 mg by mouth every 6 (six) hours as needed for mild pain.   Loratadine 10 MG Caps Take 10 mg by mouth daily as needed.   metFORMIN 1000 MG tablet Commonly known as: GLUCOPHAGE TAKE 1 TABLET BY MOUTH 2 TIMES DAILY WITH MEALS What changed:  how much to take how to take this when to take this   Mirena (52 MG) 20 MCG/DAY Iud Generic drug: levonorgestrel 1 each by Intrauterine route once.   rosuvastatin 5 MG tablet Commonly known as: Crestor Take 1 tablet (5 mg total) by mouth at bedtime.   VITAMIN D PO Take 1 tablet by mouth daily.        No Known Allergies  Consultations:    Procedures/Studies: No results found.    Subjective: Feels erythema and swelling in her right arm has improved  Discharge Exam: Vitals:   05/29/21 0542 05/29/21 1016 05/29/21 1358 05/29/21 1714  BP: 134/75 124/76 (!) 144/91 (!) 144/86  Pulse:  69 75 83 74  Resp: 18 16 16 18   Temp: (!) 97.5 F (36.4 C) (!) 97.5 F (36.4 C) 98 F (36.7 C) 97.9 F (36.6 C)  TempSrc: Oral Oral Oral Oral  SpO2: 97% 93% 91% 93%  Weight:      Height:        General: Pt is alert, awake, not in acute distress Cardiovascular: RRR, S1/S2 +, no rubs, no gallops Respiratory: CTA bilaterally, no wheezing, no rhonchi Abdominal: Soft, NT, ND, bowel sounds + Extremities: no edema, no cyanosis    The results of significant diagnostics from this hospitalization (including imaging, microbiology, ancillary and laboratory) are listed below for  reference.     Microbiology: Recent Results (from the past 240 hour(s))  Blood culture (routine x 2)     Status: None (Preliminary result)   Collection Time: 05/28/21 10:35 AM   Specimen: BLOOD  Result Value Ref Range Status   Specimen Description   Final    BLOOD BLOOD LEFT FOREARM Performed at [facility] [address] [place], [place] [address]    Special Requests   Final    BOTTLES DRAWN AEROBIC AND ANAEROBIC Blood Culture results may not be optimal due to an inadequate volume of blood received in culture bottles Performed at [facility] [address] [place], [place] [address]    Culture   Final    NO GROWTH < 24 HOURS Performed at [facility] [address], [place], [place] [address]    Report Status PENDING  Incomplete  Blood culture (routine x 2)     Status: None (Preliminary result)   Collection Time: 05/28/21 10:42 AM   Specimen: BLOOD  Result Value Ref Range Status   Specimen Description   Final    BLOOD LEFT ANTECUBITAL Performed at [facility] [address] [place], [place] [address]    Special Requests   Final    BOTTLES DRAWN AEROBIC AND ANAEROBIC Blood Culture adequate volume Performed at [facility] [address] [place], [place] [address]    Culture   Final    NO GROWTH < 24 HOURS Performed at [facility] [address], [place], [place] [address]    Report Status PENDING  Incomplete  Resp Panel by RT-PCR (Flu A&B, Covid) Nasopharyngeal Swab     Status: None   Collection Time: 05/28/21 11:04 AM   Specimen: Nasopharyngeal Swab; Nasopharyngeal(NP) swabs in vial transport medium  Result Value Ref Range Status   SARS Coronavirus 2 by RT PCR NEGATIVE NEGATIVE Final    Comment: (NOTE) SARS-CoV-2 target nucleic acids are NOT DETECTED.  The SARS-CoV-2 RNA is generally detectable in upper respiratory specimens during the acute phase of infection. The  lowest concentration of SARS-CoV-2 viral copies this assay can detect is 138 copies/mL. A negative result does not preclude SARS-Cov-2 infection and should not be used as the sole basis for treatment or other patient management decisions. A negative result may occur with  improper specimen collection/handling, submission of specimen other than nasopharyngeal swab, presence of viral mutation(s) within the areas targeted by this assay, and inadequate number of viral copies(<138 copies/mL). A negative result must be combined with clinical observations, patient history, and epidemiological information. The expected result is Negative.  Fact Sheet for Patients:  EntrepreneurPulse.com.au  Fact Sheet for Healthcare Providers:  IncredibleEmployment.be  This test is no t yet approved or cleared by the [place] and  has been authorized for detection and/or diagnosis of SARS-CoV-2 by FDA under an Emergency Use Authorization (EUA). This EUA will remain  in effect (meaning this test can be used) for the duration of the COVID-19 declaration under Section 564(b)(1) of the Act, 21 U.S.C.section 360bbb-3(b)(1), unless the authorization is terminated  or revoked sooner.       Influenza A by PCR NEGATIVE NEGATIVE Final   Influenza B by PCR NEGATIVE NEGATIVE Final    Comment: (NOTE) The Xpert Xpress SARS-CoV-2/FLU/RSV plus assay is intended as an aid in the diagnosis of influenza from Nasopharyngeal swab specimens and should not be used as a sole basis for treatment. Nasal washings and aspirates are unacceptable for Xpert Xpress SARS-CoV-2/FLU/RSV testing.  Fact Sheet for Patients: EntrepreneurPulse.com.au  Fact Sheet for Healthcare Providers: IncredibleEmployment.be  This test is not yet approved or cleared by the [place] FDA and has been authorized for detection and/or diagnosis of SARS-CoV-2 by FDA under  an Emergency Use Authorization (EUA). This EUA will remain in effect (meaning this test can be used) for the duration of the COVID-19 declaration under Section 564(b)(1) of the Act, 21 U.S.C. section 360bbb-3(b)(1), unless the authorization is terminated or revoked.  Performed at [facility], [place] [address] [place], [place] [address]      Labs: BNP (last 3 results) No results for input(s): BNP in the last 8760 hours. Basic Metabolic Panel: Recent Labs  Lab 05/23/21 1214 05/28/21 1042 05/29/21 0409  NA 138 137 138  K 4.4 4.3 3.9  CL 102 103 106  CO2 27 28 26   GLUCOSE 140* 143* 141*  BUN 11 16 17   CREATININE 0.64 0.62 0.58  CALCIUM 9.9 9.3 8.9   Liver Function Tests: Recent Labs  Lab 05/23/21 1214 05/29/21 0409  AST 37 33  ALT 73* 61*  ALKPHOS 74 57  BILITOT 0.4 0.5  PROT 7.4 6.7  ALBUMIN 4.5 3.7   No results for input(s): LIPASE, AMYLASE in the last 168 hours. No results for input(s): AMMONIA in the last 168 hours. CBC: Recent Labs  Lab 05/23/21 1214 05/28/21 1042 05/29/21 0409  WBC 7.3 6.3 7.3  NEUTROABS 3.3 3.0  --   HGB 14.8 15.0 14.1  HCT 43.6 45.1 41.5  MCV 89.5 90.6 88.9  PLT 292.0 251 235   Cardiac Enzymes: No results for input(s): CKTOTAL, CKMB, CKMBINDEX, TROPONINI in the last 168 hours. BNP: Invalid input(s): POCBNP CBG: Recent Labs  Lab 05/28/21 1700 05/28/21 2213 05/29/21 0720 05/29/21 1117 05/29/21 1730  GLUCAP 163* 129* 143* 127* 184*   D-Dimer No results for input(s): DDIMER in the last 72 hours. Hgb A1c No results for input(s): HGBA1C in the last 72 hours. Lipid Profile No results for input(s): CHOL, HDL, LDLCALC, TRIG, CHOLHDL, LDLDIRECT in the last 72 hours. Thyroid function studies No results for input(s): TSH, T4TOTAL, T3FREE, THYROIDAB in the last 72 hours.  Invalid input(s): FREET3 Anemia work up No results for input(s): VITAMINB12, FOLATE, FERRITIN, TIBC, IRON, RETICCTPCT in the last 72  hours. Urinalysis    Component Value Date/Time   BILIRUBINUR neg 05/14/2011 1601   PROTEINUR 30 05/14/2011 1601   UROBILINOGEN 2 05/14/2011 1601   NITRITE neg 05/14/2011 1601   LEUKOCYTESUR neg 05/14/2011 1601   Sepsis Labs Invalid input(s): PROCALCITONIN,  WBC,  LACTICIDVEN Microbiology Recent Results (from the past 240 hour(s))  Blood culture (routine x 2)     Status: None (Preliminary result)   Collection Time: 05/28/21 10:35 AM   Specimen:  BLOOD  Result Value Ref Range Status   Specimen Description   Final    BLOOD BLOOD LEFT FOREARM Performed at [facility] [address] [place], [place] [address]    Special Requests   Final    BOTTLES DRAWN AEROBIC AND ANAEROBIC Blood Culture results may not be optimal due to an inadequate volume of blood received in culture bottles Performed at [facility] [address] [place], [place] [address]    Culture   Final    NO GROWTH < 24 HOURS Performed at [facility] [address] [place], [place] [address]    Report Status PENDING  Incomplete  Blood culture (routine x 2)     Status: None (Preliminary result)   Collection Time: 05/28/21 10:42 AM   Specimen: BLOOD  Result Value Ref Range Status   Specimen Description   Final    BLOOD LEFT ANTECUBITAL Performed at [facility] [address] [place], [place] [address]    Special Requests   Final    BOTTLES DRAWN AEROBIC AND ANAEROBIC Blood Culture adequate volume Performed at [facility] [address], [place], [place] [address]    Culture   Final    NO GROWTH < 24 HOURS Performed at [facility] [address] [place], [place] [address]    Report Status PENDING  Incomplete  Resp Panel by RT-PCR (Flu A&B, Covid) Nasopharyngeal Swab     Status: None   Collection Time: 05/28/21 11:04 AM   Specimen: Nasopharyngeal Swab; Nasopharyngeal(NP) swabs in vial transport medium  Result  Value Ref Range Status   SARS Coronavirus 2 by RT PCR NEGATIVE NEGATIVE Final    Comment: (NOTE) SARS-CoV-2 target nucleic acids are NOT DETECTED.  The SARS-CoV-2 RNA is generally detectable in upper respiratory specimens during the acute phase of infection. The lowest concentration of SARS-CoV-2 viral copies this assay can detect is 138 copies/mL. A negative result does not preclude SARS-Cov-2 infection and should not be used as the sole basis for treatment or other patient management decisions. A negative result may occur with  improper specimen collection/handling, submission of specimen other than nasopharyngeal swab, presence of viral mutation(s) within the areas targeted by this assay, and inadequate number of viral copies(<138 copies/mL). A negative result must be combined with clinical observations, patient history, and epidemiological information. The expected result is Negative.  Fact Sheet for Patients:  EntrepreneurPulse.com.au  Fact Sheet for Healthcare Providers:  IncredibleEmployment.be  This test is no t yet approved or cleared by the [place] FDA and  has been authorized for detection and/or diagnosis of SARS-CoV-2 by FDA under an Emergency Use Authorization (EUA). This EUA will remain  in effect (meaning this test can be used) for the duration of the COVID-19 declaration under Section 564(b)(1) of the Act, 21 U.S.C.section 360bbb-3(b)(1), unless the authorization is terminated  or revoked sooner.       Influenza A by PCR NEGATIVE NEGATIVE Final   Influenza B by PCR NEGATIVE NEGATIVE Final    Comment: (NOTE) The Xpert Xpress SARS-CoV-2/FLU/RSV plus assay is intended as an aid in the diagnosis of influenza from Nasopharyngeal swab specimens and should not be used as a sole basis for treatment. Nasal washings and aspirates are unacceptable for Xpert Xpress SARS-CoV-2/FLU/RSV testing.  Fact Sheet for  Patients: EntrepreneurPulse.com.au  Fact Sheet for Healthcare Providers: IncredibleEmployment.be  This test is not yet approved or cleared by the [place] FDA  and has been authorized for detection and/or diagnosis of SARS-CoV-2 by FDA under an Emergency Use Authorization (EUA). This EUA will remain in effect (meaning this test can be used) for the duration of the COVID-19 declaration under Section 564(b)(1) of the Act, 21 U.S.C. section 360bbb-3(b)(1), unless the authorization is terminated or revoked.  Performed at [facility], [place] [address], [place], [place] [address]      Time coordinating discharge: 21mins  SIGNED:   Kathie Dike, MD  [facility] 05/29/2021, 9:40 PM   If 7PM-7AM, please contact night-coverage [email or web address]

## 2021-05-29 NOTE — Progress Notes (Addendum)
Patient was getting dalvance IV. Patient called out because she was having cramps on her right side down her leg, radiated to left hip, pelvis, right hip, back and left chest and shoulder. Paged Dr. Roderic Palau. I stopped the IV when I came into the room. The IV began at 1645 and was stopped at 1703.   Dalvance was restarted at 400 ml/h and patient tolerated with no issues.   Patient was given discharge instructions, and all questions were answered. Patient was stable for discharge and was walked to the main exit.

## 2021-05-30 ENCOUNTER — Ambulatory Visit: Payer: Managed Care, Other (non HMO) | Admitting: Family Medicine

## 2021-05-31 ENCOUNTER — Other Ambulatory Visit: Payer: Self-pay

## 2021-05-31 ENCOUNTER — Ambulatory Visit: Payer: Managed Care, Other (non HMO) | Admitting: Family Medicine

## 2021-05-31 ENCOUNTER — Other Ambulatory Visit (HOSPITAL_COMMUNITY): Payer: Self-pay

## 2021-05-31 ENCOUNTER — Encounter: Payer: Self-pay | Admitting: Family Medicine

## 2021-05-31 VITALS — BP 120/77 | HR 87 | Temp 98.9°F | Ht 62.0 in | Wt 197.4 lb

## 2021-05-31 DIAGNOSIS — L03114 Cellulitis of left upper limb: Secondary | ICD-10-CM

## 2021-05-31 DIAGNOSIS — Z872 Personal history of diseases of the skin and subcutaneous tissue: Secondary | ICD-10-CM | POA: Diagnosis not present

## 2021-05-31 MED ORDER — CEPHALEXIN 500 MG PO CAPS
500.0000 mg | ORAL_CAPSULE | Freq: Four times a day (QID) | ORAL | 0 refills | Status: AC
  Filled 2021-05-31: qty 28, 7d supply, fill #0

## 2021-05-31 NOTE — Patient Instructions (Addendum)
Please trail Keflex 4 times a day for seven days. If you cannot get all 4 doses in- at least get 3 per day for 7 days. If worsening after 24 hours let us know. Continue cool compresses.   Glad you have follow up in 2 days! Let us know if you need [place] sooner- also watch out for fevers or feeling ill overall

## 2021-05-31 NOTE — Progress Notes (Signed)
Phone [phone] In person visit   Subjective:   Cassidy Gibson is a 50 y.o. year old very pleasant female patient who presents for/with See problem oriented charting Chief Complaint  Patient presents with   Cellulitis    Left arm . Red rash on her left arm.    This visit occurred during the SARS-CoV-2 public health emergency.  Safety protocols were in place, including screening questions prior to the visit, additional usage of staff PPE, and extensive cleaning of exam room while observing appropriate contact time as indicated for disinfecting solutions.   Past Medical History-  Patient Active Problem List   Diagnosis Date Noted   Cellulitis 05/28/2021   Elevated liver enzymes 05/24/2021   Hepatic steatosis 05/24/2021   IUD (intrauterine device) in place 05/23/2021   Seasonal allergic rhinitis due to pollen 05/23/2021   Obesity (BMI 30-39.9) 05/23/2021   Combined hyperlipidemia associated with type 2 diabetes mellitus ([place]) 05/23/2021   Type 2 diabetes mellitus without complication, without long-term current use of insulin ([place]) 01/29/2020    Medications- reviewed and updated Current Outpatient Medications  Medication Sig Dispense Refill   Carboxymethylcellulose Sodium (ARTIFICIAL TEARS OP) Place 1 drop into both eyes daily as needed (dry eyes).     cephALEXin (KEFLEX) 500 MG capsule Take 1 capsule (500 mg total) by mouth 4 (four) times daily for 7 days. 28 capsule 0   fluticasone (FLONASE) 50 MCG/ACT nasal spray Place 2 sprays into both nostrils daily as needed for allergies.     ibuprofen (ADVIL) 200 MG tablet Take 600 mg by mouth every 6 (six) hours as needed for mild pain.     levonorgestrel (MIRENA, 52 MG,) 20 MCG/24HR IUD 1 each by Intrauterine route once.     Loratadine 10 MG CAPS Take 10 mg by mouth daily as needed.     metFORMIN (GLUCOPHAGE) 1000 MG tablet TAKE 1 TABLET BY MOUTH 2 TIMES DAILY WITH MEALS (Patient taking differently: Take 1,000 mg by mouth 2 (two)  times daily with a meal.) 180 tablet 0   rosuvastatin (CRESTOR) 5 MG tablet Take 1 tablet (5 mg total) by mouth at bedtime. 90 tablet 3   VITAMIN D PO Take 1 tablet by mouth daily.     No current facility-administered medications for this visit.     Objective:  BP 120/77   Pulse 87   Temp 98.9 F (37.2 C) (Temporal)   Ht 5\' 2"  (1.575 m)   Wt 197 lb 6.4 oz (89.5 kg)   SpO2 95%   BMI 36.10 kg/m  Gen: NAD, resting comfortably  CV: RRR  Lungs: nonlabored, normal respiratory rate Abdomen: soft/nondistended  Skin: warm, dry, right arm appears to have cleared prior cellulitis, left arm with erythematous, warm, tender patch above antecubital fossa-line marked was from yesterday and patient already has extension beyond this line today Neuro: grossly normal, moves all extremities       Assessment and Plan   # Hospital F/U Cellulitis in Right Arm  S:Pt was seen in ED on 05/28/2021 and admitted overnight for an evaluation of developing erythema and edema near injection site on right arm from Prevnar vaccination which has been given on 05/23/2021. She was prescribed a course of Doxcycline by her PCP however her erythema and edema continued to spread and became painful.  In the hospital-she was started on Vancomycin and was admitted for further treatments. WBC count was normal.  -  The patient did receive a dose of IV dalbavancin and  as a result/per protocol referral was made to [facility] for follow-up. While receiving this she developed severe cramping from left hip across back and down right leg and into her left shoulder. They stopped the infusion and improved within a minute or two. They were able to start again at half the rate without issues.  -Within 24 hours, slight improvement on edema and erythema began to clear. She was afebrile and WBC remained normal.  She was deemed stable for discharge and will  Today, patient reports right arm at prior cellulitis site has remained resolved. She  developed on left upper arm lower portion a erythematous patch on Tuesday midday after getting out of hospital on Monday evening. It progressed Tuesday evening beyond the marked lines. Very tender and warm. Did have IV access point on left lower arm which had to be replaced near antecubital foss- the site of erythema is just above that site. jk A/P: Appears cellulitis from right arm has resolved.  Unfortunately appears to have developed cellulitis of the left upper arm near antecubital fossa near recent IV site-it is possible she could be having a reaction to Dalbavancin injection but I do not think we can take the risk of not treating for potential cellulitis given recent hospitalization-we will start her on a course of Keflex.  Thankfully she has close follow-up with Dr. Jonni Sanger in 2 days.  We discussed if she has new or worsening symptoms to let us know immediately  Recommended follow up: As needed for new or worsening symptoms Future Appointments  Date Time Provider [place]  06/02/2021  9:30 AM Leamon Arnt, MD [facility]  06/09/2021  9:00 AM Tommy Medal, Lavell Islam, MD [facility]  08/22/2021 11:30 AM Leamon Arnt, MD [facility]    Lab/Order associations:   ICD-10-CM   1. Cellulitis of left upper extremity  L03.114     2. History of cellulitis  Z87.2       Meds ordered this encounter  Medications   cephALEXin (KEFLEX) 500 MG capsule    Sig: Take 1 capsule (500 mg total) by mouth 4 (four) times daily for 7 days.    Dispense:  28 capsule    Refill:  0   \\  Time Spent: 27 minutes of total time (9:04 AM- 9:31 AM) was spent on the date of the encounter performing the following actions: chart review prior to seeing the patient, obtaining history, performing a medically necessary exam, counseling on the treatment plan, placing orders, and documenting in our EHR.    I,Jada Bradford,acting as a scribe for Garret Reddish, MD.,have documented all relevant documentation on the  behalf of Garret Reddish, MD,as directed by  Garret Reddish, MD while in the presence of Garret Reddish, MD.   I, Garret Reddish, MD, have reviewed all documentation for this visit. The documentation on 05/31/21 for the exam, diagnosis, procedures, and orders are all accurate and complete.   Return precautions advised.  Garret Reddish, MD

## 2021-06-02 ENCOUNTER — Ambulatory Visit: Payer: Managed Care, Other (non HMO) | Admitting: Family Medicine

## 2021-06-02 ENCOUNTER — Encounter: Payer: Self-pay | Admitting: Family Medicine

## 2021-06-02 ENCOUNTER — Other Ambulatory Visit: Payer: Self-pay

## 2021-06-02 VITALS — BP 120/82 | HR 87 | Temp 99.0°F | Ht 62.0 in | Wt 195.2 lb

## 2021-06-02 DIAGNOSIS — T50Z95D Adverse effect of other vaccines and biological substances, subsequent encounter: Secondary | ICD-10-CM | POA: Diagnosis not present

## 2021-06-02 DIAGNOSIS — T8172XD Complication of vein following a procedure, not elsewhere classified, subsequent encounter: Secondary | ICD-10-CM | POA: Diagnosis not present

## 2021-06-02 DIAGNOSIS — I809 Phlebitis and thrombophlebitis of unspecified site: Secondary | ICD-10-CM

## 2021-06-02 LAB — CULTURE, BLOOD (ROUTINE X 2)
Culture: NO GROWTH
Culture: NO GROWTH
Special Requests: ADEQUATE

## 2021-06-02 NOTE — Progress Notes (Signed)
Subjective  CC:  Chief Complaint  Patient presents with   Cellulitis    Hospital follow up, pneumonia reaction 05/28/21-05/29/21    HPI: Cassidy Gibson is a 50 y.o. female who presents to the office today to address the problems listed above in the chief complaint. 50 yo here for f/u; I reviewed all related records.  Treated for cellulitis after pneumovax injection. I looked at [place]. Failed doxy and vanc and then treated w/ broad spectrum long acting abx. F/u here earlier this week with new redness on left arm at iv site. Now with tender cord in left arm. On oral abx again.  Data: never had fever or leukocytosis in spite of very red, warm area on left upper arm. "Failed" oral abx and vanc ... but never felt sick, no malaise. Arm was tender and occ itchy.  Feels well now except for sore cord in left upper arm   Assessment  1. Vaccination reaction, subsequent encounter   2. Thrombophlebitis following injection or infusion, subsequent encounter      Plan  After review and clinical exam, suspect this was vaccination localized reaction rather than cellulits:  we discussed this possibilty. At any rate, she has been treated and it has resolved. Would avoid future pneumonia vaccinations.  Left throbmophlebitis after abx infusion: warm compresses, baby asa; she may complete the abx.   Follow up: as scheduled  08/22/2021  No orders of the defined types were placed in this encounter.  No orders of the defined types were placed in this encounter.     I reviewed the patients updated PMH, FH, and SocHx.    Patient Active Problem List   Diagnosis Date Noted   Obesity (BMI 30-39.9) 05/23/2021    Priority: High   Combined hyperlipidemia associated with type 2 diabetes mellitus ([place]) 05/23/2021    Priority: High   Type 2 diabetes mellitus without complication, without long-term current use of insulin ([place]) 01/29/2020    Priority: High   Elevated liver enzymes 05/24/2021    Priority:  Medium    Hepatic steatosis 05/24/2021    Priority: Medium    IUD (intrauterine device) in place 05/23/2021    Priority: Medium    Seasonal allergic rhinitis due to pollen 05/23/2021    Priority: Low   Cellulitis 05/28/2021   Current Meds  Medication Sig   Carboxymethylcellulose Sodium (ARTIFICIAL TEARS OP) Place 1 drop into both eyes daily as needed (dry eyes).   cephALEXin (KEFLEX) 500 MG capsule Take 1 capsule (500 mg total) by mouth 4 (four) times daily for 7 days.   fluticasone (FLONASE) 50 MCG/ACT nasal spray Place 2 sprays into both nostrils daily as needed for allergies.   ibuprofen (ADVIL) 200 MG tablet Take 600 mg by mouth every 6 (six) hours as needed for mild pain.   levonorgestrel (MIRENA, 52 MG,) 20 MCG/24HR IUD 1 each by Intrauterine route once.   Loratadine 10 MG CAPS Take 10 mg by mouth daily as needed.   metFORMIN (GLUCOPHAGE) 1000 MG tablet TAKE 1 TABLET BY MOUTH 2 TIMES DAILY WITH MEALS (Patient taking differently: Take 1,000 mg by mouth 2 (two) times daily with a meal.)   rosuvastatin (CRESTOR) 5 MG tablet Take 1 tablet (5 mg total) by mouth at bedtime.   VITAMIN D PO Take 1 tablet by mouth daily.    Allergies: Patient has No Known Allergies. Family History: Patient family history includes Breast cancer in her mother; Cancer in her mother; Diabetes in her father,  maternal aunt, maternal grandmother, maternal uncle, paternal aunt, paternal grandmother, and paternal uncle; GER disease in her mother; Heart disease in her brother and paternal grandfather; Hyperlipidemia in her brother; Hypertension in her brother and father. Social History:  Patient  reports that she has never smoked. She has never used smokeless tobacco. She reports that she does not drink alcohol and does not use drugs.  Review of Systems: Constitutional: Negative for fever malaise or anorexia Cardiovascular: negative for chest pain Respiratory: negative for SOB or persistent  cough Gastrointestinal: negative for abdominal pain  Objective  Vitals: BP 120/82   Pulse 87   Temp 99 F (37.2 C) (Temporal)   Ht 5\' 2"  (1.575 m)   Wt 195 lb 3.2 oz (88.5 kg)   SpO2 94%   BMI 35.70 kg/m  General: no acute distress , A&Ox3 Right upper ext: normal appearing now Left elbow and upper inner arm: palpable and tender 4cm cord with mild warmth and surrounding erythema Skin:  Warm, no rashes    Commons side effects, risks, benefits, and alternatives for medications and treatment plan prescribed today were discussed, and the patient expressed understanding of the given instructions. Patient is instructed to call or message via MyChart if he/she has any questions or concerns regarding our treatment plan. No barriers to understanding were identified. We discussed Red Flag symptoms and signs in detail. Patient expressed understanding regarding what to do in case of urgent or emergency type symptoms.  Medication list was reconciled, printed and provided to the patient in AVS. Patient instructions and summary information was reviewed with the patient as documented in the AVS. This note was prepared with assistance of Dragon voice recognition software. Occasional wrong-word or sound-a-like substitutions may have occurred due to the inherent limitations of voice recognition software  This visit occurred during the SARS-CoV-2 public health emergency.  Safety protocols were in place, including screening questions prior to the visit, additional usage of staff PPE, and extensive cleaning of exam room while observing appropriate contact time as indicated for disinfecting solutions.

## 2021-06-09 ENCOUNTER — Ambulatory Visit: Payer: Managed Care, Other (non HMO) | Admitting: Infectious Disease

## 2021-07-18 ENCOUNTER — Other Ambulatory Visit: Payer: Self-pay | Admitting: Family Medicine

## 2021-07-27 ENCOUNTER — Encounter: Payer: Self-pay | Admitting: Gastroenterology

## 2021-08-22 ENCOUNTER — Ambulatory Visit: Payer: Managed Care, Other (non HMO) | Admitting: Family Medicine

## 2021-09-04 ENCOUNTER — Other Ambulatory Visit (HOSPITAL_COMMUNITY): Payer: Self-pay

## 2021-09-19 ENCOUNTER — Encounter: Payer: Self-pay | Admitting: Gastroenterology

## 2021-10-02 ENCOUNTER — Encounter: Payer: Managed Care, Other (non HMO) | Admitting: Gastroenterology

## 2021-10-09 ENCOUNTER — Encounter: Payer: Self-pay | Admitting: Family Medicine

## 2021-10-09 ENCOUNTER — Other Ambulatory Visit: Payer: Self-pay

## 2021-10-09 ENCOUNTER — Ambulatory Visit: Payer: Managed Care, Other (non HMO) | Admitting: Family Medicine

## 2021-10-09 VITALS — BP 119/72 | Temp 98.7°F | Wt 194.0 lb

## 2021-10-09 DIAGNOSIS — E119 Type 2 diabetes mellitus without complications: Secondary | ICD-10-CM

## 2021-10-09 DIAGNOSIS — K760 Fatty (change of) liver, not elsewhere classified: Secondary | ICD-10-CM | POA: Diagnosis not present

## 2021-10-09 DIAGNOSIS — E669 Obesity, unspecified: Secondary | ICD-10-CM | POA: Diagnosis not present

## 2021-10-09 DIAGNOSIS — E782 Mixed hyperlipidemia: Secondary | ICD-10-CM | POA: Diagnosis not present

## 2021-10-09 DIAGNOSIS — E1169 Type 2 diabetes mellitus with other specified complication: Secondary | ICD-10-CM

## 2021-10-09 LAB — POCT GLYCOSYLATED HEMOGLOBIN (HGB A1C): Hemoglobin A1C: 6.9 % — AB (ref 4.0–5.6)

## 2021-10-09 MED ORDER — ROSUVASTATIN CALCIUM 5 MG PO TABS: 5.0000 mg | ORAL_TABLET | ORAL | 3 refills | Status: DC

## 2021-10-09 NOTE — Patient Instructions (Signed)
Please return in 3 months for diabetes follow up   See if you can tolerate the crestor 1-3x/week. Start taking CoQ10 along with it. This often helps with the muscle aches.   Good luck with increasing veggies! There are so many ways to enjoy them. Be adventurous and creative and remember that taste is a learned behavior. So keep trying!  If you have any questions or concerns, please don't hesitate to send me a message via MyChart or call the office at [phone]. Thank you for visiting with [place] today! It's our pleasure caring for you.

## 2021-10-09 NOTE — Progress Notes (Signed)
Subjective  CC:  Chief Complaint  Patient presents with   Diabetes    HPI: Cassidy Gibson is a 51 y.o. female who presents to the office today for follow up of diabetes and problems listed above in the chief complaint.  Diabetes follow up: Her diabetic control is reported as Unchanged. But eating better. Met 1000 bid and prefers to not add meds.  She denies exertional CP or SOB or symptomatic hypoglycemia. She denies foot sores or paresthesias.  HLD: tried crestor for about 6 weeks: myalgias so stopped. Feeling better.  Fatty liver: mildly elevated lfts on labs. No pain. Weight is down a pound.   Wt Readings from Last 3 Encounters:  10/09/21 194 lb (88 kg)  06/02/21 195 lb 3.2 oz (88.5 kg)  05/31/21 197 lb 6.4 oz (89.5 kg)    BP Readings from Last 3 Encounters:  10/09/21 119/72  06/02/21 120/82  05/31/21 120/77    Assessment  1. Type 2 diabetes mellitus without complication, without long-term current use of insulin ([place])   2. Combined hyperlipidemia associated with type 2 diabetes mellitus ([place])   3. Hepatic steatosis   4. Obesity (BMI 30-39.9)      Plan  Diabetes is currently adequately controlled. Continue with diet changes, weight loss and met 1000 bid. Recheck q  3 mnths.  HLD: trial of once weekly and titrating crestor 5 up to 3x/weekly. Add co q10. Hopefully can tolerate it. Will recheck liver tests and lipids at next visit if able to tolerate it.  Fatty liver and obesity: low fat diet and weight loss; discussed ways to increase veggie intake.  Follow up: 3 mo to recheck diabetes. Orders Placed This Encounter  Procedures   POCT HgB A1C   Meds ordered this encounter  Medications   rosuvastatin (CRESTOR) 5 MG tablet    Sig: Take 1 tablet (5 mg total) by mouth 3 (three) times a week.    Dispense:  90 tablet    Refill:  3      Immunization History  Administered Date(s) Administered   Hepatitis A, Adult 12/26/2003   Hepatitis B 12/26/2003    Influenza,inj,Quad PF,6+ Mos 08/16/2015, 06/28/2020, 05/23/2021   Influenza-Unspecified 05/27/2013   MMR 11/25/1976   Moderna SARS-COV2 Booster Vaccination 11/04/2020   PFIZER(Purple Top)SARS-COV-2 Vaccination 10/21/2019, 11/19/2019   PNEUMOCOCCAL CONJUGATE-20 05/23/2021   Pfizer Covid-19 Vaccine Bivalent Booster 24yr & up 11/09/2020   Td 01/26/2004   Tdap 03/28/2007, 12/26/2014   Varicella 11/25/1976    Diabetes Related Lab Review: Lab Results  Component Value Date   HGBA1C 6.9 (A) 10/09/2021   HGBA1C 7.0 (A) 05/23/2021   HGBA1C 6.7 (A) 11/14/2020    Lab Results  Component Value Date   MICROALBUR <0.7 05/23/2021   Lab Results  Component Value Date   CREATININE 0.58 05/29/2021   BUN 17 05/29/2021   NA 138 05/29/2021   K 3.9 05/29/2021   CL 106 05/29/2021   CO2 26 05/29/2021   Lab Results  Component Value Date   CHOL 175 05/23/2021   CHOL 164 02/01/2020   CHOL 169 10/28/2017   Lab Results  Component Value Date   HDL 61.40 05/23/2021   HDL 55.00 02/01/2020   HDL 58.50 10/28/2017   Lab Results  Component Value Date   LDLCALC 86 05/23/2021   LDLCALC 92 02/01/2020   LDLCALC 94 10/28/2017   Lab Results  Component Value Date   TRIG 134.0 05/23/2021   TRIG 84.0 02/01/2020   TRIG 83.0  10/28/2017   Lab Results  Component Value Date   CHOLHDL 3 05/23/2021   CHOLHDL 3 02/01/2020   CHOLHDL 3 10/28/2017   No results found for: LDLDIRECT The 10-year ASCVD risk score (Arnett DK, et al., 2019) is: 1.4%   Values used to calculate the score:     Age: 52 years     Sex: Female     Is Non-Hispanic African American: No     Diabetic: Yes     Tobacco smoker: No     Systolic Blood Pressure: 102 mmHg     Is BP treated: No     HDL Cholesterol: 61.4 mg/dL     Total Cholesterol: 175 mg/dL I have reviewed the PMH, Fam and Soc history. Patient Active Problem List   Diagnosis Date Noted   Obesity (BMI 30-39.9) 05/23/2021    Priority: High   Combined hyperlipidemia  associated with type 2 diabetes mellitus ([place]) 05/23/2021    Priority: High   Type 2 diabetes mellitus without complication, without long-term current use of insulin ([place]) 01/29/2020    Priority: High    dx'd 01/2020    Elevated liver enzymes 05/24/2021    Priority: Medium    Hepatic steatosis 05/24/2021    Priority: Medium     Noted initially on abd CT 2012    IUD (intrauterine device) in place 05/23/2021    Priority: Medium     Mirena 10/2020    Seasonal allergic rhinitis due to pollen 05/23/2021    Priority: Low    Social History: Patient  reports that she has never smoked. She has never used smokeless tobacco. She reports that she does not drink alcohol and does not use drugs.  Review of Systems: Ophthalmic: negative for eye pain, loss of vision or double vision Cardiovascular: negative for chest pain Respiratory: negative for SOB or persistent cough Gastrointestinal: negative for abdominal pain Genitourinary: negative for dysuria or gross hematuria MSK: negative for foot lesions Neurologic: negative for weakness or gait disturbance  Objective  Vitals: BP 119/72    Temp 98.7 F (37.1 C) (Temporal)    Wt 194 lb (88 kg)    BMI 35.48 kg/m  General: well appearing, no acute distress  Psych:  Alert and oriented, normal mood and affect Cardiovascular:  Nl S1 and S2, RRR without murmur, gallop or rub. no edema Respiratory:  Good breath sounds bilaterally, CTAB with normal effort, no rales     Diabetic education: ongoing education regarding chronic disease management for diabetes was given today. We continue to reinforce the ABC's of diabetic management: A1c (<7 or 8 dependent upon patient), tight blood pressure control, and cholesterol management with goal LDL < 100 minimally. We discuss diet strategies, exercise recommendations, medication options and possible side effects. At each visit, we review recommended immunizations and preventive care recommendations for diabetics and  stress that good diabetic control can prevent other problems. See below for this patient's data.   Commons side effects, risks, benefits, and alternatives for medications and treatment plan prescribed today were discussed, and the patient expressed understanding of the given instructions. Patient is instructed to call or message via MyChart if he/she has any questions or concerns regarding our treatment plan. No barriers to understanding were identified. We discussed Red Flag symptoms and signs in detail. Patient expressed understanding regarding what to do in case of urgent or emergency type symptoms.  Medication list was reconciled, printed and provided to the patient in AVS. Patient instructions and summary information was reviewed  with the patient as documented in the AVS. This note was prepared with assistance of Dragon voice recognition software. Occasional wrong-word or sound-a-like substitutions may have occurred due to the inherent limitations of voice recognition software  This visit occurred during the SARS-CoV-2 public health emergency.  Safety protocols were in place, including screening questions prior to the visit, additional usage of staff PPE, and extensive cleaning of exam room while observing appropriate contact time as indicated for disinfecting solutions.

## 2021-10-19 ENCOUNTER — Other Ambulatory Visit: Payer: Self-pay

## 2021-10-19 ENCOUNTER — Other Ambulatory Visit (HOSPITAL_COMMUNITY): Payer: Self-pay

## 2021-10-19 ENCOUNTER — Ambulatory Visit (AMBULATORY_SURGERY_CENTER): Payer: Managed Care, Other (non HMO)

## 2021-10-19 VITALS — Ht 62.5 in | Wt 188.0 lb

## 2021-10-19 DIAGNOSIS — Z1211 Encounter for screening for malignant neoplasm of colon: Secondary | ICD-10-CM

## 2021-10-19 MED ORDER — NA SULFATE-K SULFATE-MG SULF 17.5-3.13-1.6 GM/177ML PO SOLN
1.0000 | Freq: Once | ORAL | 0 refills | Status: AC
  Filled 2021-10-19: qty 354, 1d supply, fill #0

## 2021-10-19 NOTE — Progress Notes (Signed)
No egg or soy allergy known to patient  No issues known to pt with past sedation with any surgeries or procedures Patient denies ever being told they had issues or difficulty with intubation  No FH of Malignant Hyperthermia Pt is not on diet pills Pt is not on home 02  Pt is not on blood thinners  Pt denies issues with constipation;  No A fib or A flutter Pt is fully vaccinated for Covid x 2 + boosters; NO PA's for preps discussed with pt in PV today  Discussed with pt there will be an out-of-pocket cost for prep and that varies from $0 to 70 + dollars - pt verbalized understanding  Due to the COVID-19 pandemic we are asking patients to follow certain guidelines in PV and the [facility]   Pt aware of COVID protocols and [facility] guidelines  PV completed over the phone. Pt verified name, DOB, address and insurance during PV today.  Pt mailed instruction packet with copy of consent form to read and not return, and instructions.  Pt encouraged to call with questions or issues.  If pt has My chart, procedure instructions sent via My Chart

## 2021-10-30 ENCOUNTER — Encounter (HOSPITAL_BASED_OUTPATIENT_CLINIC_OR_DEPARTMENT_OTHER): Payer: Self-pay | Admitting: *Deleted

## 2021-10-30 ENCOUNTER — Other Ambulatory Visit: Payer: Self-pay

## 2021-10-30 ENCOUNTER — Emergency Department (HOSPITAL_BASED_OUTPATIENT_CLINIC_OR_DEPARTMENT_OTHER): Payer: Managed Care, Other (non HMO)

## 2021-10-30 DIAGNOSIS — R1032 Left lower quadrant pain: Secondary | ICD-10-CM | POA: Diagnosis present

## 2021-10-30 DIAGNOSIS — Z7984 Long term (current) use of oral hypoglycemic drugs: Secondary | ICD-10-CM | POA: Diagnosis not present

## 2021-10-30 DIAGNOSIS — E119 Type 2 diabetes mellitus without complications: Secondary | ICD-10-CM | POA: Insufficient documentation

## 2021-10-30 DIAGNOSIS — N202 Calculus of kidney with calculus of ureter: Secondary | ICD-10-CM | POA: Diagnosis not present

## 2021-10-30 LAB — CBC
HCT: 42.5 % (ref 36.0–46.0)
Hemoglobin: 14.1 g/dL (ref 12.0–15.0)
MCH: 29.6 pg (ref 26.0–34.0)
MCHC: 33.2 g/dL (ref 30.0–36.0)
MCV: 89.3 fL (ref 80.0–100.0)
Platelets: 286 10*3/uL (ref 150–400)
RBC: 4.76 MIL/uL (ref 3.87–5.11)
RDW: 13.2 % (ref 11.5–15.5)
WBC: 9.6 10*3/uL (ref 4.0–10.5)
nRBC: 0 % (ref 0.0–0.2)

## 2021-10-30 IMAGING — CT CT RENAL STONE PROTOCOL
2 of 4 series · 16 of 46 positions shown, 18 images · non-contrast
Comparison: [DATE]

CLINICAL DATA: Left lower quadrant pain.



[Series 2: stone full · axial · 0.86mm/px · z∈[+784,+1264]mm · 13 of 106 slices shown, 15 images]
[im 5/106  soft-tissue]
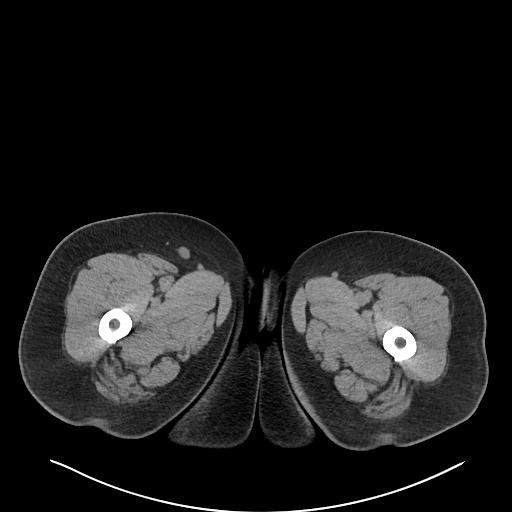
[im 5/106  bone]
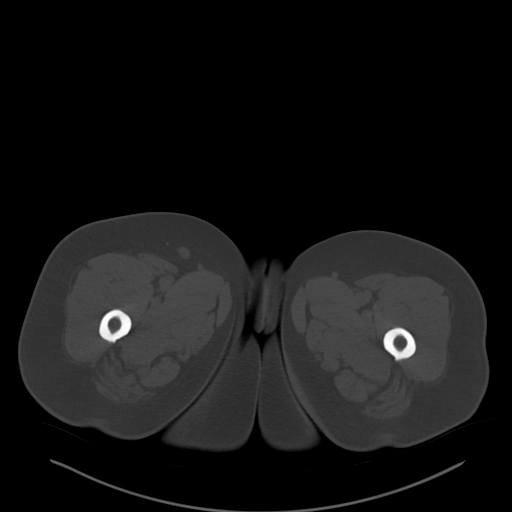
[im 13/106  soft-tissue]
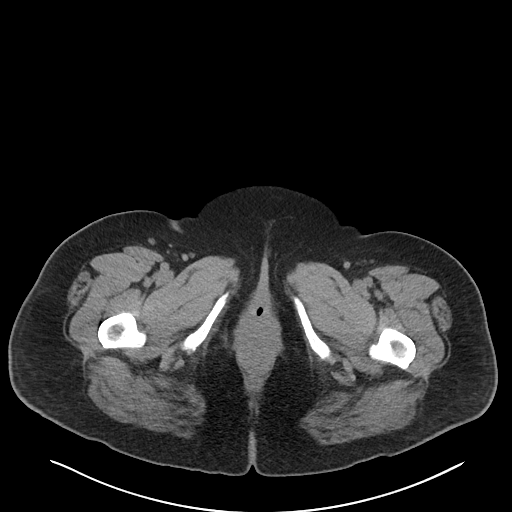
[im 22/106  soft-tissue]
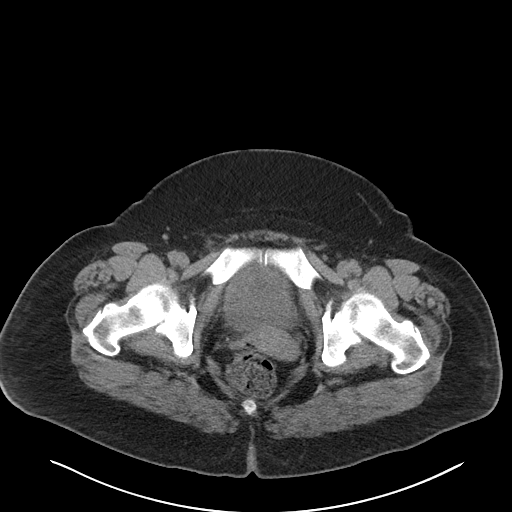
[im 30/106  soft-tissue]
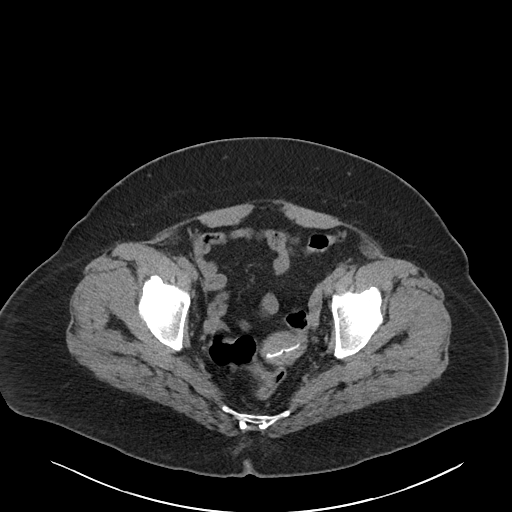
[im 38/106  soft-tissue]
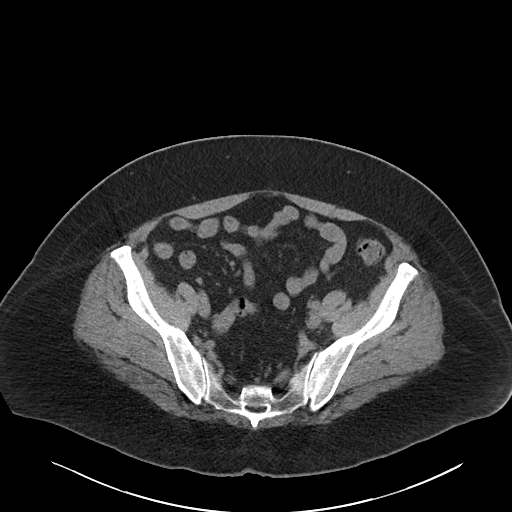
[im 47/106  soft-tissue]
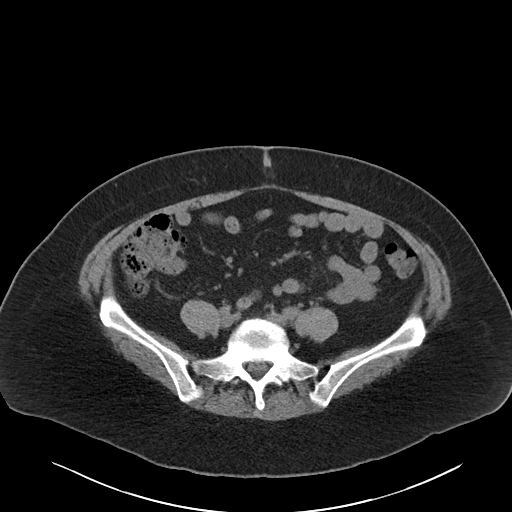
[im 55/106  soft-tissue]
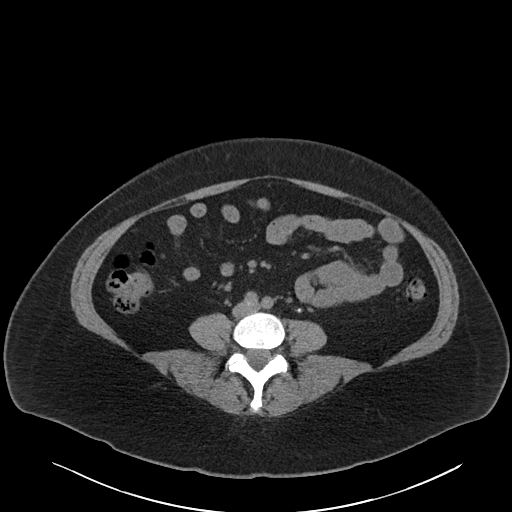
[im 59/106  soft-tissue]
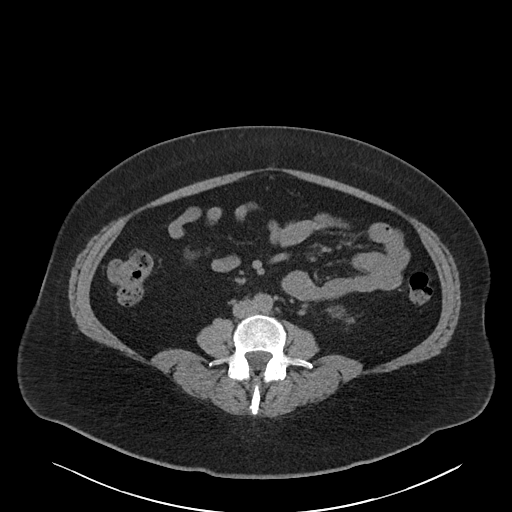
[im 68/106  soft-tissue]
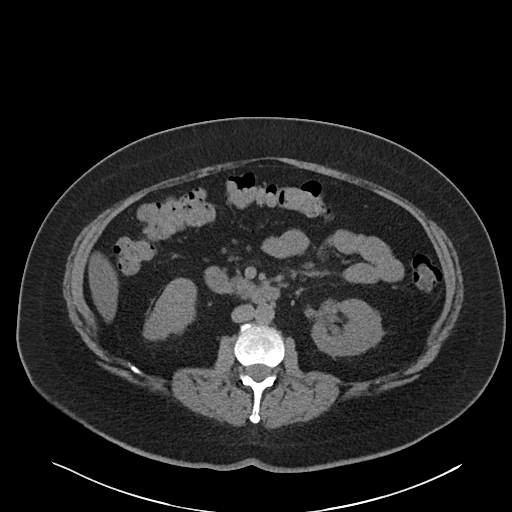
[im 68/106  bone]
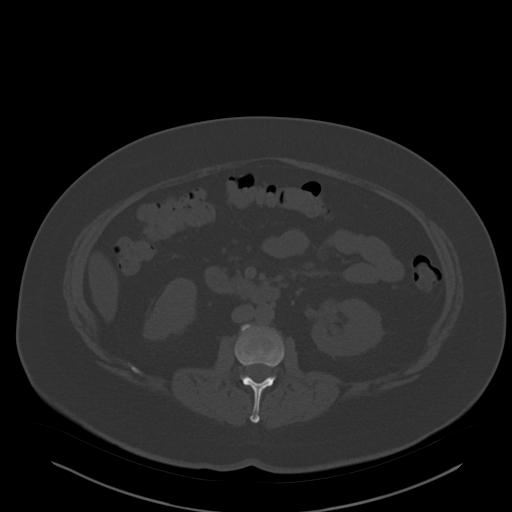
[im 76/106  soft-tissue]
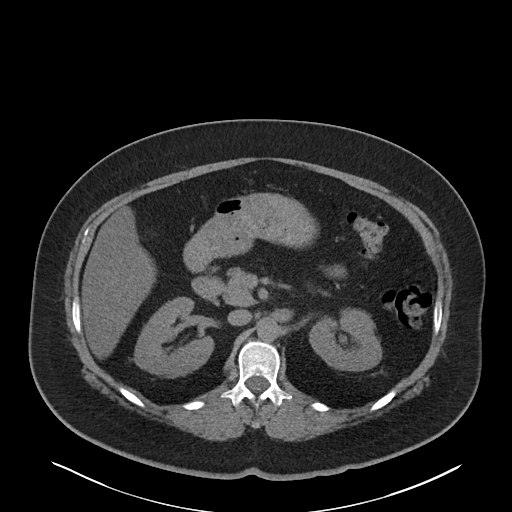
[im 85/106  soft-tissue]
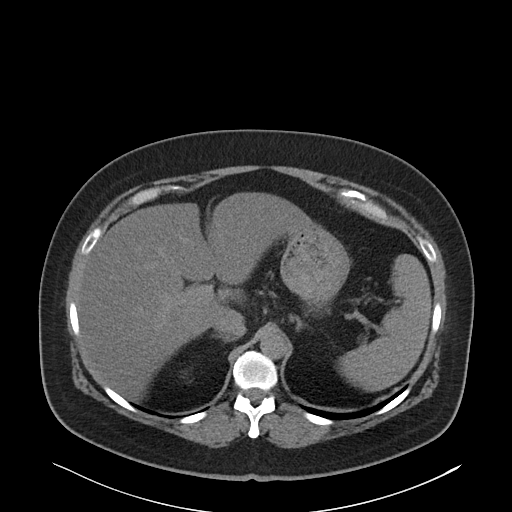
[im 93/106  soft-tissue]
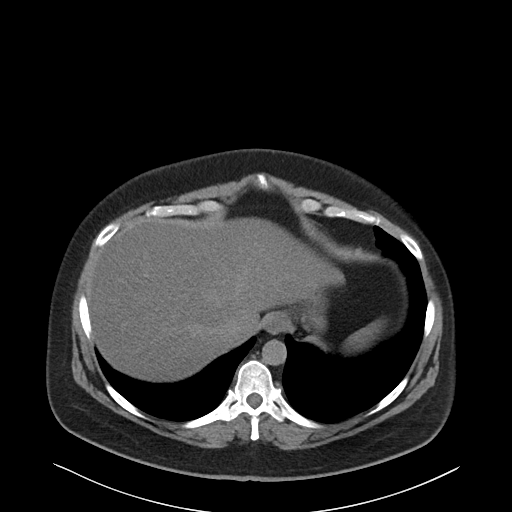
[im 101/106  soft-tissue]
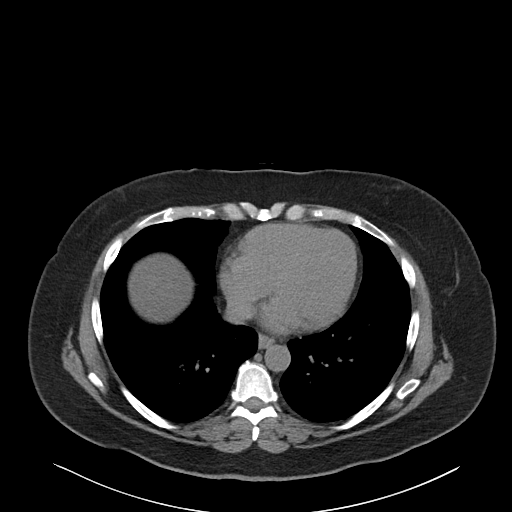

[Series 5: coronal · coronal · 0.89mm/px · 3 of 108 slices shown]
[im 36/108  soft-tissue]
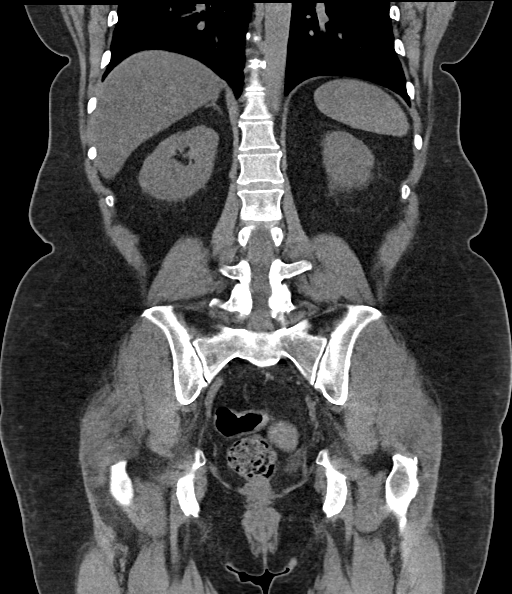
[im 48/108  soft-tissue]
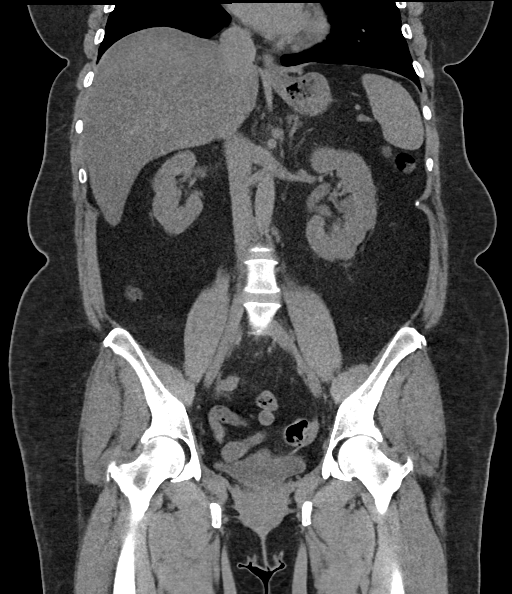
[im 60/108  soft-tissue]
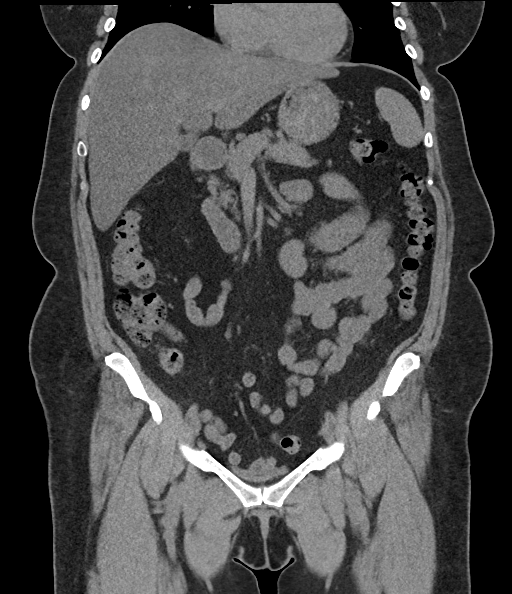

[16 of 46 positions shown; findings below may reference images not displayed]

FINDINGS: Lower chest: No acute abnormality.

Hepatobiliary: There is diffuse fatty infiltration of the liver
parenchyma. A 2.5 cm gallstone is seen within the lumen of an
otherwise normal-appearing gallbladder. There is no evidence of
biliary dilatation.

Pancreas: Unremarkable. No pancreatic ductal dilatation or
surrounding inflammatory changes.

Spleen: Normal in size without focal abnormality.

Adrenals/Urinary Tract: Adrenal glands are unremarkable. Kidneys are
normal in size, without focal lesions. A 2 mm obstructing renal
calculus is seen within the mid left ureter, with mild left-sided
hydronephrosis and hydroureter. The urinary bladder is poorly
distended and subsequently limited in evaluation.

Stomach/Bowel: Stomach is within normal limits. Appendix appears
normal. No evidence of bowel wall thickening, distention, or
inflammatory changes.

Vascular/Lymphatic: No significant vascular findings are present. No
enlarged abdominal or pelvic lymph nodes.

Reproductive: An IUD is in place. The uterus and bilateral adnexa
are otherwise unremarkable.

Other: No abdominal wall hernia or abnormality. No abdominopelvic
ascites.

Musculoskeletal: No acute or significant osseous findings.
IMPRESSION: 1. 2 mm obstructing renal calculus within the mid left ureter.
2. Cholelithiasis.
3. Hepatic steatosis.

## 2021-10-30 MED ORDER — ACETAMINOPHEN 500 MG PO TABS
1000.0000 mg | ORAL_TABLET | Freq: Once | ORAL | Status: AC
  Administered 2021-10-30: 1000 mg via ORAL
  Filled 2021-10-30: qty 2

## 2021-10-30 NOTE — ED Triage Notes (Signed)
LLQ abdominal pain wrapping around to the left side.  Pt reports that this began on Saturday and has been associated with hematuria.  Pt states that she was in [place] and just returned to the [place], she reports that when she voided pta it was bright red.  No fever or chills, Pt had her last bm today and it was wnl.  Pain is sharp and moves.   ?

## 2021-10-31 ENCOUNTER — Other Ambulatory Visit (HOSPITAL_COMMUNITY): Payer: Self-pay

## 2021-10-31 ENCOUNTER — Emergency Department (HOSPITAL_BASED_OUTPATIENT_CLINIC_OR_DEPARTMENT_OTHER)
Admission: EM | Admit: 2021-10-31 | Discharge: 2021-10-31 | Disposition: A | Discharge status: Home / Self Care | Payer: Managed Care, Other (non HMO) | Attending: Emergency Medicine | Admitting: Emergency Medicine

## 2021-10-31 DIAGNOSIS — R319 Hematuria, unspecified: Secondary | ICD-10-CM

## 2021-10-31 DIAGNOSIS — N200 Calculus of kidney: Secondary | ICD-10-CM

## 2021-10-31 LAB — COMPREHENSIVE METABOLIC PANEL
ALT: 64 U/L — ABNORMAL HIGH (ref 0–44)
AST: 30 U/L (ref 15–41)
Albumin: 4.4 g/dL (ref 3.5–5.0)
Alkaline Phosphatase: 64 U/L (ref 38–126)
Anion gap: 10 (ref 5–15)
BUN: 17 mg/dL (ref 6–20)
CO2: 25 mmol/L (ref 22–32)
Calcium: 9.2 mg/dL (ref 8.9–10.3)
Chloride: 105 mmol/L (ref 98–111)
Creatinine, Ser: 0.8 mg/dL (ref 0.44–1.00)
GFR, Estimated: >60 mL/min (ref >60)
Glucose, Bld: 180 mg/dL — ABNORMAL HIGH (ref 70–99)
Potassium: 4.2 mmol/L (ref 3.5–5.1)
Sodium: 140 mmol/L (ref 135–145)
Total Bilirubin: 0.5 mg/dL (ref 0.3–1.2)
Total Protein: 7 g/dL (ref 6.5–8.1)

## 2021-10-31 LAB — URINALYSIS, ROUTINE W REFLEX MICROSCOPIC
Bilirubin Urine: NEGATIVE
Glucose, UA: NEGATIVE mg/dL
Nitrite: NEGATIVE
Protein, ur: 100 mg/dL — AB
RBC / HPF: >50 RBC/hpf — ABNORMAL HIGH (ref 0–5)
Specific Gravity, Urine: 1.027 (ref 1.005–1.030)
WBC, UA: >50 WBC/hpf — ABNORMAL HIGH (ref 0–5)
pH: 5.5 (ref 5.0–8.0)

## 2021-10-31 LAB — LIPASE, BLOOD: Lipase: 24 U/L (ref 11–51)

## 2021-10-31 LAB — PREGNANCY, URINE: Preg Test, Ur: NEGATIVE

## 2021-10-31 MED ORDER — TAMSULOSIN HCL 0.4 MG PO CAPS
0.4000 mg | ORAL_CAPSULE | Freq: Every day | ORAL | 0 refills | Status: DC
  Filled 2021-10-31: qty 7, 7d supply, fill #0

## 2021-10-31 MED ORDER — HYDROCODONE-ACETAMINOPHEN 5-325 MG PO TABS
1.0000 | ORAL_TABLET | Freq: Four times a day (QID) | ORAL | 0 refills | Status: DC | PRN
  Filled 2021-10-31: qty 20, 3d supply, fill #0

## 2021-10-31 NOTE — ED Notes (Signed)
Pt verbalizes understanding of discharge instructions. Opportunity for questioning and answers were provided. Pt discharged from ED to home.   ? ?

## 2021-10-31 NOTE — ED Provider Notes (Signed)
?[facility] ?Provider Note ? ? ?CSN: [id number] ?Arrival date & time: 10/30/21  2300 ? ?  ? ?History ? ?Chief Complaint  ?Patient presents with  ? Abdominal Pain  ? ? ?Cassidy Gibson is a 51 y.o. female. ? ?Patient is a 51 year old female with history of type 2 diabetes.  Patient presenting today with complaints of left flank pain and blood in her urine.  Patient was on a recent medical mission to [place].  She flew from [place] to [place], then [place] to [place] today.  During the flight, she began to experience pain in her left flank along with blood in her urine.  She denies any fevers or chills.  She does report intermittent discomfort for several days prior to this episode.  She denies history of kidney stones. ? ?The history is provided by the patient.  ?Abdominal Pain ?Pain location:  LLQ and L flank ?Pain quality: stabbing   ?Pain radiates to:  LLQ ?Pain severity:  Severe ?Duration:  3 days ?Timing:  Intermittent ?Progression:  Worsening ?Chronicity:  New ? ?  ? ?Home Medications ?Prior to Admission medications   ?Medication Sig Start Date End Date Taking? Authorizing Provider  ?Carboxymethylcellulose Sodium (ARTIFICIAL TEARS OP) Place 1 drop into both eyes daily as needed (dry eyes).    [provider]  ?fluticasone (FLONASE) 50 MCG/ACT nasal spray Place 2 sprays into both nostrils daily as needed for allergies.    [provider]  ?levonorgestrel (MIRENA, 52 MG,) 20 MCG/24HR IUD 1 each by Intrauterine route once.    [provider]  ?Loratadine 10 MG CAPS Take 10 mg by mouth daily as needed.    [provider]  ?metFORMIN (GLUCOPHAGE) 1000 MG tablet TAKE 1 TABLET TWICE A DAY WITH MEALS 07/19/21   Leamon Arnt, MD  ?VITAMIN D PO Take 1 tablet by mouth daily.    [provider]  ?   ? ?Allergies    ?Patient has no known allergies.   ? ?Review of Systems   ?Review of Systems  ?Gastrointestinal:  Positive for abdominal pain.  ?All  other systems reviewed and are negative. ? ?Physical Exam ?Updated Vital Signs ?BP (!) 160/93 (BP Location: Right Arm)   Pulse 89   Temp 98.3 ?F (36.8 ?C) (Oral)   Resp 16   Ht 5' 2.5" (1.588 m)   Wt 85.3 kg   SpO2 97%   BMI 33.84 kg/m?  ?Physical Exam ?Vitals and nursing note reviewed.  ?Constitutional:   ?   General: She is not in acute distress. ?   Appearance: She is well-developed. She is not diaphoretic.  ?HENT:  ?   Head: Normocephalic and atraumatic.  ?Cardiovascular:  ?   Rate and Rhythm: Normal rate and regular rhythm.  ?   Heart sounds: No murmur heard. ?  No friction rub. No gallop.  ?Pulmonary:  ?   Effort: Pulmonary effort is normal. No respiratory distress.  ?   Breath sounds: Normal breath sounds. No wheezing.  ?Abdominal:  ?   General: Bowel sounds are normal. There is no distension.  ?   Palpations: Abdomen is soft.  ?   Tenderness: There is abdominal tenderness in the left lower quadrant. There is left CVA tenderness. There is no guarding or rebound.  ?Musculoskeletal:     ?   General: Normal range of motion.  ?   Cervical back: Normal range of motion and neck supple.  ?Skin: ?   General: Skin is  warm and dry.  ?Neurological:  ?   General: No focal deficit present.  ?   Mental Status: She is alert and oriented to person, place, and time.  ? ? ?ED Results / Procedures / Treatments   ?Labs ?(all labs ordered are listed, but only abnormal results are displayed) ?Labs Reviewed  ?COMPREHENSIVE METABOLIC PANEL - Abnormal; Notable for the following components:  ?    Result Value  ? Glucose, Bld 180 (*)   ? ALT 64 (*)   ? All other components within normal limits  ?URINALYSIS, ROUTINE W REFLEX MICROSCOPIC - Abnormal; Notable for the following components:  ? Color, Urine BROWN (*)   ? APPearance CLOUDY (*)   ? Hgb urine dipstick LARGE (*)   ? Ketones, ur TRACE (*)   ? Protein, ur 100 (*)   ? Leukocytes,Ua SMALL (*)   ? RBC / HPF >50 (*)   ? WBC, UA >50 (*)   ? Bacteria, UA RARE (*)   ? Crystals  PRESENT (*)   ? All other components within normal limits  ?LIPASE, BLOOD  ?CBC  ?PREGNANCY, URINE  ? ? ?EKG ?None ? ?Radiology ?CT Renal Stone Study ? ?Result Date: 10/31/2021 ?CLINICAL DATA:  Left lower quadrant pain. EXAM: CT ABDOMEN AND PELVIS WITHOUT CONTRAST TECHNIQUE: Multidetector CT imaging of the abdomen and pelvis was performed following the standard protocol without IV contrast. RADIATION DOSE REDUCTION: This exam was performed according to the departmental dose-optimization program which includes automated exposure control, adjustment of the mA and/or kV according to patient size and/or use of iterative reconstruction technique. COMPARISON:  May 15, 2011 FINDINGS: Lower chest: No acute abnormality. Hepatobiliary: There is diffuse fatty infiltration of the liver parenchyma. A 2.5 cm gallstone is seen within the lumen of an otherwise normal-appearing gallbladder. There is no evidence of biliary dilatation. Pancreas: Unremarkable. No pancreatic ductal dilatation or surrounding inflammatory changes. Spleen: Normal in size without focal abnormality. Adrenals/Urinary Tract: Adrenal glands are unremarkable. Kidneys are normal in size, without focal lesions. A 2 mm obstructing renal calculus is seen within the mid left ureter, with mild left-sided hydronephrosis and hydroureter. The urinary bladder is poorly distended and subsequently limited in evaluation. Stomach/Bowel: Stomach is within normal limits. Appendix appears normal. No evidence of bowel wall thickening, distention, or inflammatory changes. Vascular/Lymphatic: No significant vascular findings are present. No enlarged abdominal or pelvic lymph nodes. Reproductive: An IUD is in place. The uterus and bilateral adnexa are otherwise unremarkable. Other: No abdominal wall hernia or abnormality. No abdominopelvic ascites. Musculoskeletal: No acute or significant osseous findings. IMPRESSION: 1. 2 mm obstructing renal calculus within the mid left  ureter. 2. Cholelithiasis. 3. Hepatic steatosis. Electronically Signed   By: Virgina Norfolk M.D.   On: 10/31/2021 00:08   ? ?Procedures ?Procedures  ? ? ?Medications Ordered in ED ?Medications  ?acetaminophen (TYLENOL) tablet 1,000 mg (1,000 mg Oral Given 10/30/21 2334)  ? ? ?ED Course/ Medical Decision Making/ A&P ? ?This patient presents to the ED for concern of left flank pain and hematuria, this involves an extensive number of treatment options, and is a complaint that carries with it a high risk of complications and morbidity.  The differential diagnosis includes urinary tract infection, acute pyelonephritis ? ? ?Co morbidities that complicate the patient evaluation ? ?None ? ? ?Additional history obtained: ? ?No additional history or external records needed ? ? ?Lab Tests: ? ?I Ordered, and personally interpreted labs.  The pertinent results include: Unremarkable CBC and basic metabolic  panel.  Patient does have blood in her urine, but no definitive infection. ? ? ?Imaging Studies ordered: ? ?I ordered imaging studies including CT renal ?I independently visualized and interpreted imaging which showed a 2 mm stone in the left mid ureter ?I agree with the radiologist interpretation ? ? ?Cardiac Monitoring: ? ?None performed ? ? ?Medicines ordered and prescription drug management: ? ?No medications administered in the ER ?I have reviewed the patients home medicines and have made adjustments as needed ? ? ?Test Considered: ? ?None ? ? ?Critical Interventions: ? ?None ? ? ?Consultations Obtained: ? ?No consultations needed ? ? ?Problem List / ED Course: ? ?Patient presenting with left flank pain and hematuria caused by a 2 mm obstructing renal calculus within the mid left ureter.  Patient is afebrile with no white count.  There is no definitive evidence of infection in her urine.  I feel as though she can be safely treated with pain medication, Flomax, and follow-up with urology if symptoms do not  improve. ? ? ? ?Social Determinants of Health: ? ?None ? ? ? ? ?Final Clinical Impression(s) / ED Diagnoses ?Final diagnoses:  ?None  ? ? ?Rx / DC Orders ?ED Discharge Orders   ? ? None  ? ?  ? ? ?  ?Veryl Speak, MD ?10/31/21 0303 ? ?

## 2021-10-31 NOTE — Discharge Instructions (Addendum)
Begin taking ibuprofen 600 mg every 6 hours as needed for pain. ? ?Begin taking Flomax. ? ?Begin taking hydrocodone as needed for pain not relieved with ibuprofen. ? ?Follow-up with [facility] if your symptoms or not improving in the next 3 to 4 days.  Their contact information has been provided in this discharge summary for you to call and make these arrangements. ? ?Return to the emergency department in the meantime if you develop worsening pain, high fever, or other new and concerning symptoms. ?

## 2021-11-01 ENCOUNTER — Encounter: Payer: Managed Care, Other (non HMO) | Admitting: Gastroenterology

## 2021-11-01 ENCOUNTER — Ambulatory Visit: Payer: Managed Care, Other (non HMO) | Admitting: Family Medicine

## 2021-12-05 ENCOUNTER — Encounter: Payer: Self-pay | Admitting: Gastroenterology

## 2021-12-09 ENCOUNTER — Other Ambulatory Visit (HOSPITAL_COMMUNITY): Payer: Self-pay

## 2021-12-14 ENCOUNTER — Encounter: Payer: Self-pay | Admitting: Gastroenterology

## 2021-12-14 ENCOUNTER — Ambulatory Visit (AMBULATORY_SURGERY_CENTER): Payer: Managed Care, Other (non HMO) | Admitting: Gastroenterology

## 2021-12-14 VITALS — BP 134/89 | HR 77 | Temp 98.6°F | Resp 17 | Ht 62.5 in | Wt 188.0 lb

## 2021-12-14 DIAGNOSIS — D125 Benign neoplasm of sigmoid colon: Secondary | ICD-10-CM

## 2021-12-14 DIAGNOSIS — Z1211 Encounter for screening for malignant neoplasm of colon: Secondary | ICD-10-CM | POA: Diagnosis not present

## 2021-12-14 MED ORDER — SODIUM CHLORIDE 0.9 % IV SOLN: 500.0000 mL | Freq: Once | INTRAVENOUS | Status: DC

## 2021-12-14 NOTE — Progress Notes (Signed)
Called to room to assist during endoscopic procedure.  Patient ID and intended procedure confirmed with present staff. Received instructions for my participation in the procedure from the performing physician.  

## 2021-12-14 NOTE — Progress Notes (Signed)
Pt non-responsive, VVS, Report to RN  °

## 2021-12-14 NOTE — Progress Notes (Signed)
[facility] History and Physical ? ? ?Primary Care Physician:  Leamon Arnt, MD ? ? ?Reason for Procedure:  Colorectal cancer screening ? ?Plan:    Screening colonoscopy with possible interventions as needed ? ? ? ? ?HPI: Cassidy Gibson is a very pleasant 51 y.o. female here for screening colonoscopy. ?Denies any nausea, vomiting, abdominal pain, melena or bright red blood per rectum ? ?The risks and benefits as well as alternatives of endoscopic procedure(s) have been discussed and reviewed. All questions answered. The patient agrees to proceed. ? ? ? ?Past Medical History:  ?Diagnosis Date  ? Diabetes mellitus without complication ([place])   ? on meds  ? Hepatic steatosis 05/24/2021  ? Noted initially on abd CT 2012  ? PONV (postoperative nausea and vomiting)   ? Prediabetes   ? Seasonal allergies   ? ? ?Past Surgical History:  ?Procedure Laterality Date  ? ADENOIDECTOMY  08/27/1976  ? BREAST REDUCTION SURGERY  08/27/1996  ? CESAREAN SECTION  08/28/1999  ? TYMPANOSTOMY TUBE PLACEMENT  08/27/1977  ? [place] EXTRACTION  1990  ? ? ?Prior to Admission medications   ?Medication Sig Start Date End Date Taking? Authorizing Provider  ?Carboxymethylcellulose Sodium (ARTIFICIAL TEARS OP) Place 1 drop into both eyes daily as needed (dry eyes).   Yes [provider]  ?fluticasone (FLONASE) 50 MCG/ACT nasal spray Place 2 sprays into both nostrils daily as needed for allergies.   Yes [provider]  ?levonorgestrel (MIRENA, 52 MG,) 20 MCG/24HR IUD 1 each by Intrauterine route once.   Yes [provider]  ?Loratadine 10 MG CAPS Take 10 mg by mouth daily as needed.   Yes [provider]  ?metFORMIN (GLUCOPHAGE) 1000 MG tablet TAKE 1 TABLET TWICE A DAY WITH MEALS 07/19/21  Yes Leamon Arnt, MD  ?HYDROcodone-acetaminophen (NORCO) 5-325 MG tablet Take 1-2 tablets by mouth every 6 (six) hours as needed. 10/31/21   Veryl Speak, MD  ?tamsulosin (FLOMAX) 0.4 MG CAPS capsule Take  1 capsule (0.4 mg total) by mouth daily. 10/31/21   Veryl Speak, MD  ?VITAMIN D PO Take 1 tablet by mouth daily.    [provider]  ? ? ?Current Outpatient Medications  ?Medication Sig Dispense Refill  ? Carboxymethylcellulose Sodium (ARTIFICIAL TEARS OP) Place 1 drop into both eyes daily as needed (dry eyes).    ? fluticasone (FLONASE) 50 MCG/ACT nasal spray Place 2 sprays into both nostrils daily as needed for allergies.    ? levonorgestrel (MIRENA, 52 MG,) 20 MCG/24HR IUD 1 each by Intrauterine route once.    ? Loratadine 10 MG CAPS Take 10 mg by mouth daily as needed.    ? metFORMIN (GLUCOPHAGE) 1000 MG tablet TAKE 1 TABLET TWICE A DAY WITH MEALS 180 tablet 3  ? HYDROcodone-acetaminophen (NORCO) 5-325 MG tablet Take 1-2 tablets by mouth every 6 (six) hours as needed. 20 tablet 0  ? tamsulosin (FLOMAX) 0.4 MG CAPS capsule Take 1 capsule (0.4 mg total) by mouth daily. 7 capsule 0  ? VITAMIN D PO Take 1 tablet by mouth daily.    ? ?Current Facility-Administered Medications  ?Medication Dose Route Frequency Provider Last Rate Last Admin  ? 0.9 %  sodium chloride infusion  500 mL Intravenous Once Bhavya Eschete, Venia Minks, MD      ? ? ?Allergies as of 12/14/2021  ? (No Known Allergies)  ? ? ?Family History  ?Problem Relation Age of Onset  ? Breast cancer Mother   ? GER disease Mother   ?  Cancer Mother   ?     Breast  ? Diabetes Father   ? Hypertension Father   ? Hyperlipidemia Brother   ? Hypertension Brother   ? Heart disease Brother   ? Diabetes Maternal Aunt   ? Diabetes Maternal Uncle   ? Diabetes Paternal Aunt   ? Diabetes Paternal Uncle   ? Diabetes Maternal Grandmother   ? Diabetes Paternal Grandmother   ? Heart disease Paternal Grandfather   ? Colon polyps Neg Hx   ? Colon cancer Neg Hx   ? Esophageal cancer Neg Hx   ? Rectal cancer Neg Hx   ? Stomach cancer Neg Hx   ? ? ?Social History  ? ?Socioeconomic History  ? Marital status: Married  ?  Spouse name: Not on file  ? Number of children: Not on file  ?  Years of education: Not on file  ? Highest education level: Not on file  ?Occupational History  ? Not on file  ?Tobacco Use  ? Smoking status: Never  ? Smokeless tobacco: Never  ?Vaping Use  ? Vaping Use: Never used  ?Substance and Sexual Activity  ? Alcohol use: No  ?  Alcohol/week: 0.0 standard drinks  ? Drug use: No  ? Sexual activity: Yes  ?Other Topics Concern  ? Not on file  ?Social History Narrative  ? Not on file  ? ?Social Determinants of Health  ? ?Financial Resource Strain: Not on file  ?Food Insecurity: Not on file  ?Transportation Needs: Not on file  ?Physical Activity: Not on file  ?Stress: Not on file  ?Social Connections: Not on file  ?Intimate Partner Violence: Not on file  ? ? ?Review of Systems: ? ?All other review of systems negative except as mentioned in the HPI. ? ?Physical Exam: ?Vital signs in last 24 hours: ?BP 139/86   Pulse 84   Temp 98.6 ?F (37 ?C)   Ht 5' 2.5" (1.588 m)   Wt 188 lb (85.3 kg)   SpO2 96%   BMI 33.84 kg/m?  ?General:   Alert, NAD ?Lungs:  Clear .   ?Heart:  Regular rate and rhythm ?Abdomen:  Soft, nontender and nondistended. ?Neuro/Psych:  Alert and cooperative. Normal mood and affect. A and O x 3 ? ?Reviewed labs, radiology imaging, old records and pertinent past GI work up ? ?Patient is appropriate for planned procedure(s) and anesthesia in an ambulatory setting ? ? ?K. Denzil Magnuson , MD ?[phone]  ? ? ?  ?

## 2021-12-14 NOTE — Op Note (Signed)
[facility] ?Patient Name: Cassidy Gibson ?Procedure Date: 12/14/2021 8:47 AM ?MRN: [id number] ?Endoscopist: Mauri Pole , MD ?Age: 51 ?Referring MD:  ?Date of Birth: 1971-05-20 ?Gender: Female ?Account #: [id number] ?Procedure:                Colonoscopy ?Indications:              Screening for colorectal malignant neoplasm ?Medicines:                Monitored Anesthesia Care ?Procedure:                Pre-Anesthesia Assessment: ?                          - Prior to the procedure, a History and Physical  ?                          was performed, and patient medications and  ?                          allergies were reviewed. The patient's tolerance of  ?                          previous anesthesia was also reviewed. The risks  ?                          and benefits of the procedure and the sedation  ?                          options and risks were discussed with the patient.  ?                          All questions were answered, and informed consent  ?                          was obtained. Prior Anticoagulants: The patient has  ?                          taken no previous anticoagulant or antiplatelet  ?                          agents. ASA Grade Assessment: II - A patient with  ?                          mild systemic disease. After reviewing the risks  ?                          and benefits, the patient was deemed in  ?                          satisfactory condition to undergo the procedure. ?                          After obtaining informed consent, the colonoscope  ?  was passed under direct vision. Throughout the  ?                          procedure, the patient's blood pressure, pulse, and  ?                          oxygen saturations were monitored continuously. The  ?                          [id number]-HQ190L Colonoscope was introduced through the  ?                          anus and advanced to the the cecum, identified by  ?                          appendiceal  orifice and ileocecal valve. The  ?                          colonoscopy was performed without difficulty. The  ?                          patient tolerated the procedure well. The quality  ?                          of the bowel preparation was excellent. The  ?                          ileocecal valve, appendiceal orifice, and rectum  ?                          were photographed. ?Scope In: 8:57:57 AM ?Scope Out: 9:11:28 AM ?Scope Withdrawal Time: 0 hours 9 minutes 54 seconds  ?Total Procedure Duration: 0 hours 13 minutes 31 seconds  ?Findings:                 The perianal and digital rectal examinations were  ?                          normal. ?                          A 5 mm polyp was found in the sigmoid colon. The  ?                          polyp was sessile. The polyp was removed with a  ?                          cold snare. Resection and retrieval were complete. ?                          Non-bleeding external and internal hemorrhoids were  ?                          found during retroflexion. The hemorrhoids were  ?  medium-sized. ?Complications:            No immediate complications. ?Estimated Blood Loss:     Estimated blood loss was minimal. ?Impression:               - One 5 mm polyp in the sigmoid colon, removed with  ?                          a cold snare. Resected and retrieved. ?                          - Non-bleeding external and internal hemorrhoids. ?Recommendation:           - Patient has a contact number available for  ?                          emergencies. The signs and symptoms of potential  ?                          delayed complications were discussed with the  ?                          patient. Return to normal activities tomorrow.  ?                          Written discharge instructions were provided to the  ?                          patient. ?                          - Resume previous diet. ?                          - Continue present medications. ?                           - Await pathology results. ?                          - Repeat colonoscopy in 5-10 years for surveillance  ?                          based on pathology results. ?Mauri Pole, MD ?12/14/2021 9:18:32 AM ?This report has been signed electronically. ?

## 2021-12-14 NOTE — Patient Instructions (Signed)
Read all of the handouts given to you by your recovery room nurse. ? ?YOU HAD AN ENDOSCOPIC PROCEDURE TODAY AT THE [facility]:   Refer to the procedure report that was given to you for any specific questions about what was found during the examination.  If the procedure report does not answer your questions, please call your gastroenterologist to clarify.  If you requested that your care partner not be given the details of your procedure findings, then the procedure report has been included in a sealed envelope for you to review at your convenience later. ? ?YOU SHOULD EXPECT: Some feelings of bloating in the abdomen. Passage of more gas than usual.  Walking can help get rid of the air that was put into your GI tract during the procedure and reduce the bloating. If you had a lower endoscopy (such as a colonoscopy or flexible sigmoidoscopy) you may notice spotting of blood in your stool or on the toilet paper. If you underwent a bowel prep for your procedure, you may not have a normal bowel movement for a few days. ? ?Please Note:  You might notice some irritation and congestion in your nose or some drainage.  This is from the oxygen used during your procedure.  There is no need for concern and it should clear up in a day or so. ? ?SYMPTOMS TO REPORT IMMEDIATELY: ? ?Following lower endoscopy (colonoscopy or flexible sigmoidoscopy): ? Excessive amounts of blood in the stool ? Significant tenderness or worsening of abdominal pains ? Swelling of the abdomen that is new, acute ? Fever of 100?F or higher ? ?  ?For urgent or emergent issues, a gastroenterologist can be reached at any hour by calling [phone]. ?Do not use MyChart messaging for urgent concerns.  ? ? ?DIET:  We do recommend a small meal at first, but then you may proceed to your regular diet.  Drink plenty of fluids but you should avoid alcoholic beverages for 24 hours. ? ?ACTIVITY:  You should plan to take it easy for the rest of today  and you should NOT DRIVE or use heavy machinery until tomorrow (because of the sedation medicines used during the test).   ? ?FOLLOW UP: ?Our staff will call the number listed on your records 48-72 hours following your procedure to check on you and address any questions or concerns that you may have regarding the information given to you following your procedure. If we do not reach you, we will leave a message.  We will attempt to reach you two times.  During this call, we will ask if you have developed any symptoms of COVID 19. If you develop any symptoms (ie: fever, flu-like symptoms, shortness of breath, cough etc.) before then, please call [phone].  If you test positive for Covid 19 in the 2 weeks post procedure, please call and report this information to us.   ? ?If any biopsies were taken you will be contacted by phone or by letter within the next 1-3 weeks.  Please call us at [phone] if you have not heard about the biopsies in 3 weeks.  ? ? ?SIGNATURES/CONFIDENTIALITY: ?You and/or your care partner have signed paperwork which will be entered into your electronic medical record.  These signatures attest to the fact that that the information above on your After Visit Summary has been reviewed and is understood.  Full responsibility of the confidentiality of this discharge information lies with you and/or your care-partner.  ?

## 2021-12-14 NOTE — Progress Notes (Signed)
VS by HC ? ?Pt's states no medical or surgical changes since previsit or office visit. ? ?

## 2021-12-18 ENCOUNTER — Telehealth: Payer: Self-pay | Admitting: *Deleted

## 2021-12-18 ENCOUNTER — Ambulatory Visit: Payer: Managed Care, Other (non HMO) | Admitting: Podiatry

## 2021-12-18 NOTE — Telephone Encounter (Signed)
?  Follow up Call- ? ? ?  12/14/2021  ?  7:41 AM  ?Call back number  ?Post procedure Call Back phone  # [phone]  ?Permission to leave phone message Yes  ?  ? ?Patient questions: ? ?Do you have a fever, pain , or abdominal swelling? No. ?Pain Score  0 * ? ?Have you tolerated food without any problems? Yes.   ? ?Have you been able to return to your normal activities? Yes.   ? ?Do you have any questions about your discharge instructions: ?Diet   No. ?Medications  No. ?Follow up visit  No. ? ?Do you have questions or concerns about your Care? No. ? ?Actions: ?* If pain score is 4 or above: ?No action needed, pain <4. ? ? ?

## 2021-12-22 ENCOUNTER — Encounter: Payer: Self-pay | Admitting: Gastroenterology

## 2021-12-26 ENCOUNTER — Ambulatory Visit (INDEPENDENT_AMBULATORY_CARE_PROVIDER_SITE_OTHER): Payer: Managed Care, Other (non HMO) | Admitting: Nurse Practitioner

## 2021-12-26 ENCOUNTER — Encounter: Payer: Self-pay | Admitting: Nurse Practitioner

## 2021-12-26 VITALS — BP 123/78 | HR 83 | Temp 97.7°F | Ht 62.0 in | Wt 189.0 lb

## 2021-12-26 DIAGNOSIS — Z6834 Body mass index (BMI) 34.0-34.9, adult: Secondary | ICD-10-CM

## 2021-12-26 DIAGNOSIS — E119 Type 2 diabetes mellitus without complications: Secondary | ICD-10-CM

## 2021-12-26 DIAGNOSIS — Z7689 Persons encountering health services in other specified circumstances: Secondary | ICD-10-CM

## 2021-12-26 NOTE — Progress Notes (Signed)
? ?New Patient Office Visit ? ?Subjective   ? ?Patient ID: Cassidy Gibson, female    DOB: 1971-03-08  Age: 51 y.o. MRN: [id number] ? ?CC:  ?Chief Complaint  ?Patient presents with  ? New Patient (Initial Visit)  ? ? ?HPI ?Cassidy Gibson presents to establish care ?-history of type 2 diabetes. Takes Metformin. Did have HgbA1c checked less than 3 months ago and it was 6.9.  ?-due to have routine physical and routine, fasting labs. Will need to have urine microalbumin  ?-she does see GYN for well woman care.  ?-denies chest pain, chest pressure, or shortness of breath. She denies headaches or visual disturbances. She denies abdominal pain, nausea, vomiting, or changes in bowel or bladder habits.   ? ?Outpatient Encounter Medications as of 12/26/2021  ?Medication Sig  ? Carboxymethylcellulose Sodium (ARTIFICIAL TEARS OP) Place 1 drop into both eyes daily as needed (dry eyes).  ? fluticasone (FLONASE) 50 MCG/ACT nasal spray Place 2 sprays into both nostrils daily as needed for allergies.  ? levonorgestrel (MIRENA, 52 MG,) 20 MCG/24HR IUD 1 each by Intrauterine route once.  ? Loratadine 10 MG CAPS Take 10 mg by mouth daily as needed.  ? metFORMIN (GLUCOPHAGE) 1000 MG tablet TAKE 1 TABLET TWICE A DAY WITH MEALS  ? VITAMIN D PO Take 1 tablet by mouth daily.  ? [DISCONTINUED] HYDROcodone-acetaminophen (NORCO) 5-325 MG tablet Take 1-2 tablets by mouth every 6 (six) hours as needed. (Patient not taking: Reported on 12/26/2021)  ? [DISCONTINUED] tamsulosin (FLOMAX) 0.4 MG CAPS capsule Take 1 capsule (0.4 mg total) by mouth daily. (Patient not taking: Reported on 12/26/2021)  ? ?No facility-administered encounter medications on file as of 12/26/2021.  ? ? ?Past Medical History:  ?Diagnosis Date  ? Diabetes mellitus without complication ([place])   ? on meds  ? Hepatic steatosis 05/24/2021  ? Noted initially on abd CT 2012  ? PONV (postoperative nausea and vomiting)   ? Prediabetes   ? Seasonal allergies   ? ? ?Past Surgical History:   ?Procedure Laterality Date  ? ADENOIDECTOMY  08/27/1976  ? BREAST REDUCTION SURGERY  08/27/1996  ? CESAREAN SECTION  08/28/1999  ? TYMPANOSTOMY TUBE PLACEMENT  08/27/1977  ? [place] EXTRACTION  1990  ? ? ?Family History  ?Problem Relation Age of Onset  ? Breast cancer Mother   ? GER disease Mother   ? Cancer Mother   ?     Breast  ? Diabetes Father   ? Hypertension Father   ? Hyperlipidemia Brother   ? Hypertension Brother   ? Heart disease Brother   ? Diabetes Maternal Aunt   ? Diabetes Maternal Uncle   ? Diabetes Paternal Aunt   ? Diabetes Paternal Uncle   ? Diabetes Maternal Grandmother   ? Diabetes Paternal Grandmother   ? Heart disease Paternal Grandfather   ? Colon polyps Neg Hx   ? Colon cancer Neg Hx   ? Esophageal cancer Neg Hx   ? Rectal cancer Neg Hx   ? Stomach cancer Neg Hx   ? ? ?Social History  ? ?Socioeconomic History  ? Marital status: Married  ?  Spouse name: Not on file  ? Number of children: Not on file  ? Years of education: Not on file  ? Highest education level: Not on file  ?Occupational History  ? Not on file  ?Tobacco Use  ? Smoking status: Never  ? Smokeless tobacco: Never  ?Vaping Use  ? Vaping Use: Never used  ?  Substance and Sexual Activity  ? Alcohol use: No  ?  Alcohol/week: 0.0 standard drinks  ? Drug use: No  ? Sexual activity: Yes  ?Other Topics Concern  ? Not on file  ?Social History Narrative  ? Not on file  ? ?Social Determinants of Health  ? ?Financial Resource Strain: Not on file  ?Food Insecurity: Not on file  ?Transportation Needs: Not on file  ?Physical Activity: Not on file  ?Stress: Not on file  ?Social Connections: Not on file  ?Intimate Partner Violence: Not on file  ? ? ?Review of Systems  ?Constitutional:  Negative for chills, fever and malaise/fatigue.  ?HENT:  Negative for congestion, sinus pain and sore throat.   ?Eyes: Negative.   ?Respiratory:  Negative for cough, shortness of breath and wheezing.   ?Cardiovascular:  Negative for chest pain, palpitations and  leg swelling.  ?Gastrointestinal:  Negative for constipation, diarrhea, nausea and vomiting.  ?Genitourinary: Negative.   ?Musculoskeletal:  Negative for myalgias.  ?Skin: Negative.   ?Neurological:  Negative for dizziness and headaches.  ?Endo/Heme/Allergies:  Does not bruise/bleed easily.  ?     Well controlled type 2 diabetes   ?Psychiatric/Behavioral:  Negative for depression. The patient is not nervous/anxious.   ? ?  ? ? ?Objective   ? ?Today's Vitals  ? 12/26/21 0945  ?BP: 123/78  ?Pulse: 83  ?Temp: 97.7 ?F (36.5 ?C)  ?SpO2: 96%  ?Weight: 189 lb (85.7 kg)  ?Height: '5\' 2"'$  (1.575 m)  ? ?Body mass index is 34.57 kg/m?.  ? ?Physical Exam ?Vitals and nursing note reviewed.  ?Constitutional:   ?   Appearance: Normal appearance. She is well-developed.  ?HENT:  ?   Head: Normocephalic and atraumatic.  ?Eyes:  ?   Pupils: Pupils are equal, round, and reactive to light.  ?Cardiovascular:  ?   Rate and Rhythm: Normal rate and regular rhythm.  ?   Pulses: Normal pulses.  ?   Heart sounds: Normal heart sounds.  ?Pulmonary:  ?   Effort: Pulmonary effort is normal.  ?   Breath sounds: Normal breath sounds.  ?Abdominal:  ?   Palpations: Abdomen is soft.  ?Musculoskeletal:     ?   General: Normal range of motion.  ?   Cervical back: Normal range of motion and neck supple.  ?Lymphadenopathy:  ?   Cervical: No cervical adenopathy.  ?Skin: ?   General: Skin is warm and dry.  ?   Capillary Refill: Capillary refill takes less than 2 seconds.  ?Neurological:  ?   General: No focal deficit present.  ?   Mental Status: She is alert and oriented to person, place, and time.  ?Psychiatric:     ?   Mood and Affect: Mood normal.     ?   Behavior: Behavior normal.     ?   Thought Content: Thought content normal.     ?   Judgment: Judgment normal.  ? ? ? ?  ? ?Assessment & Plan:  ?1. Type 2 diabetes mellitus without complication, without long-term current use of insulin ([place]) ?Well managed. Will check HgbA1c and urine microalbumin at next  visit. Adjust metformin dosing as indicated.  ? ?2. Body mass index (BMI) of 34.0-34.9 in adult ?Discussed lowering calorie intake to 1500 calories per day and incorporating exercise into daily routine to help lose weight.  ? ?3. Encounter to establish care ?Appointment today to establish new primary care provider.  ? ?  ?Problem List Items Addressed This  Visit   ? ?  ? Endocrine  ? Type 2 diabetes mellitus without complication, without long-term current use of insulin ([place]) - Primary  ?  ? Other  ? Body mass index (BMI) of 34.0-34.9 in adult  ? ?Other Visit Diagnoses   ? ? Encounter to establish care      ? ?  ? ? ?Return in about 6 weeks (around 02/06/2022) for health maintenance exam, FBW a week prior to visit, check urine microalbumin at visit .  ? ?Ronnell Freshwater, NP ? ? ?

## 2022-01-08 ENCOUNTER — Ambulatory Visit: Payer: Managed Care, Other (non HMO) | Admitting: Family Medicine

## 2022-01-30 ENCOUNTER — Other Ambulatory Visit: Payer: Managed Care, Other (non HMO)

## 2022-01-30 DIAGNOSIS — Z0000 Encounter for general adult medical examination without abnormal findings: Secondary | ICD-10-CM

## 2022-01-30 DIAGNOSIS — Z130 Encounter for screening for diseases of the blood and blood-forming organs and certain disorders involving the immune mechanism: Secondary | ICD-10-CM

## 2022-01-31 LAB — CBC WITH DIFFERENTIAL/PLATELET
Basophils Absolute: 0.1 10*3/uL (ref 0.0–0.2)
Basos: 1 %
EOS (ABSOLUTE): 0.5 10*3/uL — ABNORMAL HIGH (ref 0.0–0.4)
Eos: 7 %
Hematocrit: 44.2 % (ref 34.0–46.6)
Hemoglobin: 15.3 g/dL (ref 11.1–15.9)
Immature Grans (Abs): 0 10*3/uL (ref 0.0–0.1)
Immature Granulocytes: 0 %
Lymphocytes Absolute: 2.4 10*3/uL (ref 0.7–3.1)
Lymphs: 30 %
MCH: 30.8 pg (ref 26.6–33.0)
MCHC: 34.6 g/dL (ref 31.5–35.7)
MCV: 89 fL (ref 79–97)
Monocytes Absolute: 0.7 10*3/uL (ref 0.1–0.9)
Monocytes: 9 %
Neutrophils Absolute: 4.3 10*3/uL (ref 1.4–7.0)
Neutrophils: 53 %
Platelets: 294 10*3/uL (ref 150–450)
RBC: 4.96 x10E6/uL (ref 3.77–5.28)
RDW: 12.6 % (ref 11.7–15.4)
WBC: 8 10*3/uL (ref 3.4–10.8)

## 2022-01-31 LAB — HEMOGLOBIN A1C
Est. average glucose Bld gHb Est-mCnc: 148 mg/dL
Hgb A1c MFr Bld: 6.8 % — ABNORMAL HIGH (ref 4.8–5.6)

## 2022-01-31 LAB — COMPREHENSIVE METABOLIC PANEL
ALT: 60 IU/L — ABNORMAL HIGH (ref 0–32)
AST: 37 IU/L (ref 0–40)
Albumin/Globulin Ratio: 1.8 (ref 1.2–2.2)
Albumin: 4.4 g/dL (ref 3.8–4.8)
Alkaline Phosphatase: 88 IU/L (ref 44–121)
BUN/Creatinine Ratio: 22 (ref 9–23)
BUN: 13 mg/dL (ref 6–24)
Bilirubin Total: 0.5 mg/dL (ref 0.0–1.2)
CO2: 22 mmol/L (ref 20–29)
Calcium: 9.1 mg/dL (ref 8.7–10.2)
Chloride: 100 mmol/L (ref 96–106)
Creatinine, Ser: 0.6 mg/dL (ref 0.57–1.00)
Globulin, Total: 2.4 g/dL (ref 1.5–4.5)
Glucose: 141 mg/dL — ABNORMAL HIGH (ref 70–99)
Potassium: 4.6 mmol/L (ref 3.5–5.2)
Sodium: 138 mmol/L (ref 134–144)
Total Protein: 6.8 g/dL (ref 6.0–8.5)
eGFR: 109 mL/min/{1.73_m2} (ref >59)

## 2022-01-31 LAB — LIPID PANEL
Chol/HDL Ratio: 3 ratio (ref 0.0–4.4)
Cholesterol, Total: 189 mg/dL (ref 100–199)
HDL: 63 mg/dL (ref >39)
LDL Chol Calc (NIH): 109 mg/dL — ABNORMAL HIGH (ref 0–99)
Triglycerides: 94 mg/dL (ref 0–149)
VLDL Cholesterol Cal: 17 mg/dL (ref 5–40)

## 2022-01-31 LAB — TSH: TSH: 1.34 u[IU]/mL (ref 0.450–4.500)

## 2022-02-04 NOTE — Progress Notes (Signed)
Labs stable. Discuss with patient at visit 02/06/2022

## 2022-02-05 NOTE — Progress Notes (Signed)
Complete physical exam   Patient: Cassidy Gibson   DOB: 07-Aug-1971   51 y.o. Female  MRN: [id number] Visit Date: 02/06/2022    Chief Complaint  Patient presents with   Annual Exam   Subjective    Cassidy Gibson is a 51 y.o. female who presents today for a complete physical exam.  She reports consuming a general diet. Home exercise routine includes walking 1 hrs per day. She and her daughter are trying to walk between 3 to 5 times per week. She generally feels well. She does not have additional problems to discuss today.   HPI  Annual physical today -diabetic. HgbA1c checked and was 6.8. -routine fasting labs done prior to this visit.  --mild elevation of ALT at 60. Patient states that she has family history of fatty liver. She did have imaging of her liver a few years ago. Everything was good.  --mild elevation of LDL at 109.  -colonoscopy done 12/14/2021. Single 96m polyp was found and removed. A repeat procedure has been recommended for 7 years   Past Medical History:  Diagnosis Date   Diabetes mellitus without complication ([place]    on meds   Hepatic steatosis 05/24/2021   Noted initially on abd CT 2012   PONV (postoperative nausea and vomiting)    Prediabetes    Seasonal allergies    Past Surgical History:  Procedure Laterality Date   ADENOIDECTOMY  08/27/1976   BREAST REDUCTION SURGERY  08/27/1996   CESAREAN SECTION  08/28/1999   TYMPANOSTOMY TUBE PLACEMENT  08/27/1977   WISDOM TOOTH EXTRACTION  1990   Social History   Socioeconomic History   Marital status: Married    Spouse name: Not on file   Number of children: Not on file   Years of education: Not on file   Highest education level: Not on file  Occupational History   Not on file  Tobacco Use   Smoking status: Never   Smokeless tobacco: Never  Vaping Use   Vaping Use: Never used  Substance and Sexual Activity   Alcohol use: No    Alcohol/week: 0.0 standard drinks of alcohol   Drug use: No   Sexual  activity: Yes  Other Topics Concern   Not on file  Social History Narrative   Not on file   Social Determinants of Health   Financial Resource Strain: Not on file  Food Insecurity: Not on file  Transportation Needs: Not on file  Physical Activity: Not on file  Stress: Not on file  Social Connections: Not on file  Intimate Partner Violence: Not on file   Family Status  Relation Name Status   Mother  Alive   Father  (Not Specified)   Brother  (Not Specified)   Brother  (Not Specified)   Mat Aunt  (Not Specified)   Mat Uncle  (Not Specified)   PEthlyn Daniels (Not Specified)   PAnnamarie Major (Not Specified)   MGM  (Not Specified)   MGF  Deceased at age 50  PMount Carroll (Not Specified)   PGF  Deceased at age 50  Neg Hx  (Not Specified)   Family History  Problem Relation Age of Onset   Breast cancer Mother    GER disease Mother    Cancer Mother        Breast   Diabetes Father    Hypertension Father    Hyperlipidemia Brother    Hypertension Brother    Heart disease Brother  Diabetes Maternal Aunt    Diabetes Maternal Uncle    Diabetes Paternal Aunt    Diabetes Paternal Uncle    Diabetes Maternal Grandmother    Diabetes Paternal Grandmother    Heart disease Paternal Grandfather    Colon polyps Neg Hx    Colon cancer Neg Hx    Esophageal cancer Neg Hx    Rectal cancer Neg Hx    Stomach cancer Neg Hx    No Known Allergies  Patient Care Team: Ronnell Freshwater, NP as PCP - General (Family Medicine) Himmelrich, Bryson Ha, RD (Inactive) as Dietitian Marylynn Pearson, MD as Consulting Physician (Obstetrics and Gynecology)   Medications: Outpatient Medications Prior to Visit  Medication Sig   Carboxymethylcellulose Sodium (ARTIFICIAL TEARS OP) Place 1 drop into both eyes daily as needed (dry eyes).   fluticasone (FLONASE) 50 MCG/ACT nasal spray Place 2 sprays into both nostrils daily as needed for allergies.   levonorgestrel (MIRENA, 52 MG,) 20 MCG/24HR IUD 1 each by Intrauterine  route once.   Loratadine 10 MG CAPS Take 10 mg by mouth daily as needed.   metFORMIN (GLUCOPHAGE) 1000 MG tablet TAKE 1 TABLET TWICE A DAY WITH MEALS   VITAMIN D PO Take 1 tablet by mouth daily.   No facility-administered medications prior to visit.    Review of Systems  Constitutional:  Negative for activity change, appetite change, chills, fatigue and fever.  HENT:  Negative for congestion, postnasal drip, rhinorrhea, sinus pressure, sinus pain, sneezing and sore throat.   Eyes: Negative.   Respiratory:  Negative for cough, chest tightness, shortness of breath and wheezing.   Cardiovascular:  Negative for chest pain and palpitations.  Gastrointestinal:  Negative for abdominal pain, constipation, diarrhea, nausea and vomiting.  Endocrine: Negative for cold intolerance, heat intolerance, polydipsia and polyuria.       Well managed type 2 diabetes   Genitourinary:  Negative for dyspareunia, dysuria, flank pain, frequency and urgency.  Musculoskeletal:  Negative for arthralgias, back pain and myalgias.  Skin:  Negative for rash.  Allergic/Immunologic: Negative for environmental allergies.  Neurological:  Negative for dizziness, weakness and headaches.  Hematological:  Negative for adenopathy.  Psychiatric/Behavioral:  The patient is not nervous/anxious.     Last CBC Lab Results  Component Value Date   WBC 8.0 01/30/2022   HGB 15.3 01/30/2022   HCT 44.2 01/30/2022   MCV 89 01/30/2022   MCH 30.8 01/30/2022   RDW 12.6 01/30/2022   PLT 294 03/83/3383   Last metabolic panel Lab Results  Component Value Date   GLUCOSE 141 (H) 01/30/2022   NA 138 01/30/2022   K 4.6 01/30/2022   CL 100 01/30/2022   CO2 22 01/30/2022   BUN 13 01/30/2022   CREATININE 0.60 01/30/2022   EGFR 109 01/30/2022   CALCIUM 9.1 01/30/2022   PROT 6.8 01/30/2022   ALBUMIN 4.4 01/30/2022   LABGLOB 2.4 01/30/2022   AGRATIO 1.8 01/30/2022   BILITOT 0.5 01/30/2022   ALKPHOS 88 01/30/2022   AST 37  01/30/2022   ALT 60 (H) 01/30/2022   ANIONGAP 10 10/30/2021   Last lipids Lab Results  Component Value Date   CHOL 189 01/30/2022   HDL 63 01/30/2022   LDLCALC 109 (H) 01/30/2022   TRIG 94 01/30/2022   CHOLHDL 3.0 01/30/2022   Last hemoglobin A1c Lab Results  Component Value Date   HGBA1C 6.8 (H) 01/30/2022   Last thyroid functions Lab Results  Component Value Date   TSH 1.340 01/30/2022  Objective     Today's Vitals   02/06/22 0925  BP: 111/74  Pulse: 73  Temp: (!) 97.1 F (36.2 C)  SpO2: 94%  Weight: 190 lb 14.4 oz (86.6 kg)  Height: 5' 1.81" (1.57 m)   Body mass index is 35.13 kg/m.   BP Readings from Last 3 Encounters:  02/06/22 111/74  12/26/21 123/78  12/14/21 134/89    Wt Readings from Last 3 Encounters:  02/06/22 190 lb 14.4 oz (86.6 kg)  12/26/21 189 lb (85.7 kg)  12/14/21 188 lb (85.3 kg)     Physical Exam Vitals and nursing note reviewed.  Constitutional:      Appearance: Normal appearance. She is well-developed.  HENT:     Head: Normocephalic and atraumatic.     Right Ear: Tympanic membrane, ear canal and external ear normal.     Left Ear: Tympanic membrane, ear canal and external ear normal.     Nose: Nose normal.     Mouth/Throat:     Mouth: Mucous membranes are moist.     Pharynx: Oropharynx is clear.  Eyes:     Extraocular Movements: Extraocular movements intact.     Conjunctiva/sclera: Conjunctivae normal.     Pupils: Pupils are equal, round, and reactive to light.  Neck:     Vascular: No carotid bruit.  Cardiovascular:     Rate and Rhythm: Normal rate and regular rhythm.     Pulses: Normal pulses.     Heart sounds: Normal heart sounds.  Pulmonary:     Effort: Pulmonary effort is normal.     Breath sounds: Normal breath sounds.  Abdominal:     General: Bowel sounds are normal. There is no distension.     Palpations: Abdomen is soft. There is no mass.     Tenderness: There is no abdominal tenderness. There is no  right CVA tenderness, left CVA tenderness, guarding or rebound.     Hernia: No hernia is present.  Musculoskeletal:        General: Normal range of motion.     Cervical back: Normal range of motion and neck supple.  Lymphadenopathy:     Cervical: No cervical adenopathy.  Skin:    General: Skin is warm and dry.     Capillary Refill: Capillary refill takes less than 2 seconds.  Neurological:     General: No focal deficit present.     Mental Status: She is alert and oriented to person, place, and time.  Psychiatric:        Mood and Affect: Mood normal.        Behavior: Behavior normal.        Thought Content: Thought content normal.        Judgment: Judgment normal.     Last depression screening scores    02/06/2022    9:30 AM 12/26/2021    9:49 AM 05/23/2021   11:28 AM  PHQ 2/9 Scores  PHQ - 2 Score 0 0 0  PHQ- 9 Score 0 0    Last fall risk screening    12/26/2021    9:48 AM  Fall Risk   Falls in the past year? 0  Number falls in past yr: 0  Injury with Fall? 0  Risk for fall due to : No Fall Risks  Follow up Falls evaluation completed     Results for orders placed or performed in visit on 02/06/22  POCT UA - Microalbumin  Result Value Ref Range   Microalbumin Ur, POC  30 mg/L   Creatinine, POC 300 mg/dL   Albumin/Creatinine Ratio, Urine, POC <30     Assessment & Plan    1. Encounter for general adult medical examination with abnormal findings Annual physical today.  2. Type 2 diabetes mellitus without complication, without long-term current use of insulin ([facility]) Hemoglobin A1c 6.8.  Continue metformin as prescribed.  Urine microalbumin done today is within normal limits.  Recheck hemoglobin A1c in 4 months. - POCT UA - Microalbumin  3. Dyslipidemia, goal LDL below 100 Labs showing LDL of 109. Recommend patient limit intake of fried and fatty foods. She should increase intake of lean proteins and green leafy vegetables. Adding exercise into daily routine will also be  beneficial.  Recheck fasting lipids in 1 year.    Immunization History  Administered Date(s) Administered   Hepatitis A, Adult 12/26/2003   Hepatitis B 12/26/2003   Influenza,inj,Quad PF,6+ Mos 08/16/2015, 06/28/2020, 05/23/2021   Influenza-Unspecified 05/27/2013   MMR 11/25/1976   Moderna SARS-COV2 Booster Vaccination 11/04/2020   PFIZER(Purple Top)SARS-COV-2 Vaccination 10/21/2019, 11/19/2019   PNEUMOCOCCAL CONJUGATE-20 05/23/2021   Pfizer Covid-19 Vaccine Bivalent Booster 18yr & up 11/09/2020   Td 01/26/2004   Tdap 03/28/2007, 12/26/2014   Varicella 11/25/1976    Health Maintenance  Topic Date Due   Zoster Vaccines- Shingrix (1 of 2) 05/09/2022 (Originally 12/07/2021)   Hepatitis C Screening  02/07/2023 (Originally 12/07/1989)   INFLUENZA VACCINE  03/27/2022   OPHTHALMOLOGY EXAM  03/27/2022   MAMMOGRAM  04/26/2022   FOOT EXAM  05/23/2022   HEMOGLOBIN A1C  08/01/2022   PAP SMEAR-Modifier  01/08/2023   URINE MICROALBUMIN  02/07/2023   TETANUS/TDAP  12/25/2024   COLONOSCOPY (Pts 45-411yrInsurance coverage will need to be confirmed)  12/14/2028   COVID-19 Vaccine  Completed   HPV VACCINES  Aged Out   HIV Screening  Discontinued    Discussed health benefits of physical activity, and encouraged her to engage in regular exercise appropriate for her age and condition.  Problem List Items Addressed This Visit       Endocrine   Type 2 diabetes mellitus without complication, without long-term current use of insulin ([place]  Relevant Orders   POCT UA - Microalbumin (Completed)     Other   Dyslipidemia, goal LDL below 100   Other Visit Diagnoses     Encounter for general adult medical examination with abnormal findings    -  Primary        Return in about 4 months (around 06/08/2022) for diabetes with HgbA1c check.        HeRonnell FreshwaterNP  [phone]) [phone])  [place]

## 2022-02-06 ENCOUNTER — Ambulatory Visit (INDEPENDENT_AMBULATORY_CARE_PROVIDER_SITE_OTHER): Payer: Managed Care, Other (non HMO) | Admitting: Nurse Practitioner

## 2022-02-06 ENCOUNTER — Encounter: Payer: Self-pay | Admitting: Nurse Practitioner

## 2022-02-06 VITALS — BP 111/74 | HR 73 | Temp 97.1°F | Ht 61.81 in | Wt 190.9 lb

## 2022-02-06 DIAGNOSIS — E119 Type 2 diabetes mellitus without complications: Secondary | ICD-10-CM

## 2022-02-06 DIAGNOSIS — E785 Hyperlipidemia, unspecified: Secondary | ICD-10-CM

## 2022-02-06 DIAGNOSIS — Z0001 Encounter for general adult medical examination with abnormal findings: Secondary | ICD-10-CM | POA: Diagnosis not present

## 2022-02-06 LAB — POCT UA - MICROALBUMIN
Albumin/Creatinine Ratio, Urine, POC: <30
Creatinine, POC: 300 mg/dL
Microalbumin Ur, POC: 30 mg/L

## 2022-02-11 DIAGNOSIS — E785 Hyperlipidemia, unspecified: Secondary | ICD-10-CM | POA: Insufficient documentation

## 2022-02-11 DIAGNOSIS — E1169 Type 2 diabetes mellitus with other specified complication: Secondary | ICD-10-CM | POA: Insufficient documentation

## 2022-03-20 LAB — HM PAP SMEAR: HM Pap smear: NEGATIVE

## 2022-03-21 ENCOUNTER — Other Ambulatory Visit: Payer: Self-pay | Admitting: Obstetrics and Gynecology

## 2022-03-21 DIAGNOSIS — Z1239 Encounter for other screening for malignant neoplasm of breast: Secondary | ICD-10-CM

## 2022-03-21 LAB — HM DIABETES EYE EXAM

## 2022-04-03 ENCOUNTER — Encounter: Payer: Self-pay | Admitting: Nurse Practitioner

## 2022-05-17 ENCOUNTER — Ambulatory Visit: Payer: Managed Care, Other (non HMO) | Admitting: Nurse Practitioner

## 2022-05-17 ENCOUNTER — Encounter: Payer: Self-pay | Admitting: Nurse Practitioner

## 2022-05-17 VITALS — BP 139/69 | HR 119 | Ht 61.81 in | Wt 192.0 lb

## 2022-05-17 DIAGNOSIS — J0140 Acute pansinusitis, unspecified: Secondary | ICD-10-CM

## 2022-05-17 LAB — POCT INFLUENZA A/B
Influenza A, POC: NEGATIVE
Influenza B, POC: NEGATIVE

## 2022-05-17 LAB — POCT RAPID STREP A (OFFICE): Rapid Strep A Screen: NEGATIVE

## 2022-05-17 MED ORDER — AZITHROMYCIN 250 MG PO TABS: ORAL_TABLET | ORAL | 0 refills | Status: DC

## 2022-05-17 NOTE — Progress Notes (Signed)
Established patient visit   Patient: Cassidy Gibson   DOB: 1971-04-16   51 y.o. Female  MRN: [id number] Visit Date: 05/17/2022  Chief Complaint  Patient presents with   Cough   Subjective    Patient states that she has taken three at home COVID 19 tests which has all yielded negative results   Sinusitis This is a new problem. The current episode started in the past 7 days. The problem is unchanged. There has been no fever. Associated symptoms include chills, congestion, coughing, ear pain, headaches, shortness of breath, sneezing and a sore throat. Pertinent negatives include no diaphoresis or sinus pressure. Treatments tried: has been taking Mucinex d which has helped some. has also been using afrin whih also helps. The treatment provided mild relief.     Medications: Outpatient Medications Prior to Visit  Medication Sig   Carboxymethylcellulose Sodium (ARTIFICIAL TEARS OP) Place 1 drop into both eyes daily as needed (dry eyes).   fluticasone (FLONASE) 50 MCG/ACT nasal spray Place 2 sprays into both nostrils daily as needed for allergies.   levonorgestrel (MIRENA, 52 MG,) 20 MCG/24HR IUD 1 each by Intrauterine route once.   Loratadine 10 MG CAPS Take 10 mg by mouth daily as needed.   metFORMIN (GLUCOPHAGE) 1000 MG tablet TAKE 1 TABLET TWICE A DAY WITH MEALS   VITAMIN D PO Take 1 tablet by mouth daily.   No facility-administered medications prior to visit.    Review of Systems  Constitutional:  Positive for chills. Negative for diaphoresis.  HENT:  Positive for congestion, ear discharge, ear pain, postnasal drip, rhinorrhea, sneezing and sore throat. Negative for sinus pressure.   Respiratory:  Positive for cough and shortness of breath.   Musculoskeletal:  Positive for myalgias.  Allergic/Immunologic: Positive for environmental allergies.  Neurological:  Positive for headaches.  Hematological:  Positive for adenopathy.     Objective     Today's Vitals   05/17/22 1537   BP: 139/69  Pulse: (Abnormal) 119  SpO2: 95%  Weight: 192 lb (87.1 kg)  Height: 5' 1.81" (1.57 m)   Body mass index is 35.33 kg/m.   Physical Exam Vitals and nursing note reviewed.  Constitutional:      Appearance: Normal appearance. She is well-developed. She is ill-appearing.  HENT:     Head: Normocephalic and atraumatic.     Right Ear: Tympanic membrane, ear canal and external ear normal.     Left Ear: Tympanic membrane, ear canal and external ear normal.     Nose: Congestion present.     Mouth/Throat:     Mouth: Mucous membranes are moist.     Pharynx: Oropharynx is clear.  Eyes:     Extraocular Movements: Extraocular movements intact.     Conjunctiva/sclera: Conjunctivae normal.     Pupils: Pupils are equal, round, and reactive to light.  Cardiovascular:     Rate and Rhythm: Normal rate and regular rhythm.     Pulses: Normal pulses.     Heart sounds: Normal heart sounds.     Comments: Mild tachycardia  Pulmonary:     Effort: Pulmonary effort is normal.     Breath sounds: Normal breath sounds.     Comments: Dry, non-productive cough Abdominal:     Palpations: Abdomen is soft.  Musculoskeletal:        General: Normal range of motion.     Cervical back: Normal range of motion and neck supple.  Lymphadenopathy:     Cervical: Cervical adenopathy present.  Skin:  General: Skin is warm and dry.     Capillary Refill: Capillary refill takes less than 2 seconds.  Neurological:     General: No focal deficit present.     Mental Status: She is alert and oriented to person, place, and time.  Psychiatric:        Mood and Affect: Mood normal.        Behavior: Behavior normal.        Thought Content: Thought content normal.        Judgment: Judgment normal.       Assessment & Plan     1. Acute non-recurrent pansinusitis Testing for flu and strep are both negative today. Treat for sinus infection. Start z-pack. Take as directed for 5 days. Rest and increase fluids.  Continue using OTC medication to control symptoms.   - azithromycin (ZITHROMAX) 250 MG tablet; z-pack - take as directed for 5 days  Dispense: 6 tablet; Refill: 0   Return for prn worsening or persistent symptoms.        Ronnell Freshwater, NP  [facility] at [facility] [phone] (phone) [phone] (fax)  [place]

## 2022-06-07 ENCOUNTER — Ambulatory Visit: Payer: Managed Care, Other (non HMO) | Admitting: Nurse Practitioner

## 2022-06-11 NOTE — Progress Notes (Signed)
Established patient visit   Patient: Cassidy Gibson   DOB: [birth year]   51 y.o. Female  MRN: [id number] Visit Date: 06/12/2022   No chief complaint on file.  Subjective    HPI  Follow up visit  -diabetes with HgbA1c check  -most recent HgbA1c 6.6  -urine microalbumin done 01/2022 and was normal.  -patient due for breast cancer screening    Medications: Outpatient Medications Prior to Visit  Medication Sig   azithromycin (ZITHROMAX) 250 MG tablet z-pack - take as directed for 5 days   Carboxymethylcellulose Sodium (ARTIFICIAL TEARS OP) Place 1 drop into both eyes daily as needed (dry eyes).   fluticasone (FLONASE) 50 MCG/ACT nasal spray Place 2 sprays into both nostrils daily as needed for allergies.   levonorgestrel (MIRENA, 52 MG,) 20 MCG/24HR IUD 1 each by Intrauterine route once.   Loratadine 10 MG CAPS Take 10 mg by mouth daily as needed.   metFORMIN (GLUCOPHAGE) 1000 MG tablet TAKE 1 TABLET TWICE A DAY WITH MEALS   VITAMIN D PO Take 1 tablet by mouth daily.   No facility-administered medications prior to visit.    Review of Systems  Last CBC Lab Results  Component Value Date   WBC 8.0 01/30/2022   HGB 15.3 01/30/2022   HCT 44.2 01/30/2022   MCV 89 01/30/2022   MCH 30.8 01/30/2022   RDW 12.6 01/30/2022   PLT 294 73/42/8768   Last metabolic panel Lab Results  Component Value Date   GLUCOSE 141 (H) 01/30/2022   NA 138 01/30/2022   K 4.6 01/30/2022   CL 100 01/30/2022   CO2 22 01/30/2022   BUN 13 01/30/2022   CREATININE 0.60 01/30/2022   EGFR 109 01/30/2022   CALCIUM 9.1 01/30/2022   PROT 6.8 01/30/2022   ALBUMIN 4.4 01/30/2022   LABGLOB 2.4 01/30/2022   AGRATIO 1.8 01/30/2022   BILITOT 0.5 01/30/2022   ALKPHOS 88 01/30/2022   AST 37 01/30/2022   ALT 60 (H) 01/30/2022   ANIONGAP 10 10/30/2021   Last lipids Lab Results  Component Value Date   CHOL 189 01/30/2022   HDL 63 01/30/2022   LDLCALC 109 (H) 01/30/2022   TRIG 94 01/30/2022   CHOLHDL  3.0 01/30/2022   Last hemoglobin A1c Lab Results  Component Value Date   HGBA1C 6.8 (H) 01/30/2022   Last thyroid functions Lab Results  Component Value Date   TSH 1.340 01/30/2022        Objective    There were no vitals filed for this visit. There is no height or weight on file to calculate BMI.  BP Readings from Last 3 Encounters:  05/17/22 139/69  02/06/22 111/74  12/26/21 123/78    Wt Readings from Last 3 Encounters:  05/17/22 192 lb (87.1 kg)  02/06/22 190 lb 14.4 oz (86.6 kg)  12/26/21 189 lb (85.7 kg)    Physical Exam  ***  No results found for any visits on 06/12/22.  Assessment & Plan     Problem List Items Addressed This Visit   None    No follow-ups on file.         Ronnell Freshwater, NP  [facility] at [facility] [phone] (phone) [phone] (fax)  [place]

## 2022-06-12 ENCOUNTER — Encounter: Payer: Self-pay | Admitting: Nurse Practitioner

## 2022-06-12 ENCOUNTER — Ambulatory Visit: Payer: Managed Care, Other (non HMO) | Admitting: Nurse Practitioner

## 2022-06-12 VITALS — BP 137/83 | HR 72 | Ht 62.5 in | Wt 195.2 lb

## 2022-06-12 DIAGNOSIS — Z6835 Body mass index (BMI) 35.0-35.9, adult: Secondary | ICD-10-CM | POA: Diagnosis not present

## 2022-06-12 DIAGNOSIS — E119 Type 2 diabetes mellitus without complications: Secondary | ICD-10-CM

## 2022-06-12 LAB — POCT GLYCOSYLATED HEMOGLOBIN (HGB A1C): Hemoglobin A1C: 8.3 % — ABNORMAL HIGH (ref 4.0–5.6)

## 2022-06-12 MED ORDER — TIRZEPATIDE 2.5 MG/0.5ML ~~LOC~~ SOAJ: 2.5000 mg | SUBCUTANEOUS | 1 refills | Status: DC

## 2022-06-13 ENCOUNTER — Encounter: Payer: Self-pay | Admitting: Nurse Practitioner

## 2022-06-14 ENCOUNTER — Other Ambulatory Visit: Payer: Self-pay

## 2022-06-14 ENCOUNTER — Telehealth: Payer: Self-pay

## 2022-06-14 DIAGNOSIS — E119 Type 2 diabetes mellitus without complications: Secondary | ICD-10-CM

## 2022-06-14 MED ORDER — TIRZEPATIDE 2.5 MG/0.5ML ~~LOC~~ SOAJ: 2.5000 mg | SUBCUTANEOUS | 1 refills | Status: DC

## 2022-06-14 NOTE — Telephone Encounter (Signed)
Called pt she is advised of her Rx that was sent to [facility] for them to send a PA to get started she will pick up a sample in office

## 2022-06-14 NOTE — Telephone Encounter (Signed)
Patient called office to see if authorization for her [facility] request submitted as standard that can take up to 8 days, needs approval, provider has to request authorization to [facility] so patient can get it [place], please advise, thanks!

## 2022-06-18 ENCOUNTER — Ambulatory Visit (INDEPENDENT_AMBULATORY_CARE_PROVIDER_SITE_OTHER): Payer: Managed Care, Other (non HMO)

## 2022-06-18 ENCOUNTER — Other Ambulatory Visit: Payer: Self-pay | Admitting: Nurse Practitioner

## 2022-06-18 VITALS — BP 132/78 | HR 69 | Temp 97.8°F | Ht 62.5 in | Wt 195.4 lb

## 2022-06-18 DIAGNOSIS — E119 Type 2 diabetes mellitus without complications: Secondary | ICD-10-CM

## 2022-06-18 DIAGNOSIS — Z23 Encounter for immunization: Secondary | ICD-10-CM

## 2022-06-18 MED ORDER — BLOOD GLUCOSE METER KIT: PACK | 0 refills | Status: AC

## 2022-06-18 NOTE — Progress Notes (Signed)
Patient is here for her Flu vax  Pt tolerated the injection

## 2022-06-18 NOTE — Progress Notes (Signed)
Prescription for blood glucometer, strips, and lancets sent to [facility].

## 2022-06-21 ENCOUNTER — Other Ambulatory Visit: Payer: Self-pay | Admitting: Family Medicine

## 2022-07-04 ENCOUNTER — Other Ambulatory Visit: Payer: Self-pay | Admitting: Nurse Practitioner

## 2022-07-04 ENCOUNTER — Encounter: Payer: Self-pay | Admitting: Nurse Practitioner

## 2022-07-04 DIAGNOSIS — E119 Type 2 diabetes mellitus without complications: Secondary | ICD-10-CM

## 2022-07-04 MED ORDER — TIRZEPATIDE 5 MG/0.5ML ~~LOC~~ SOAJ: 5.0000 mg | SUBCUTANEOUS | 0 refills | Status: DC

## 2022-07-06 ENCOUNTER — Other Ambulatory Visit: Payer: Self-pay | Admitting: Family Medicine

## 2022-07-09 ENCOUNTER — Other Ambulatory Visit: Payer: Self-pay | Admitting: Nurse Practitioner

## 2022-07-09 DIAGNOSIS — E119 Type 2 diabetes mellitus without complications: Secondary | ICD-10-CM

## 2022-07-09 MED ORDER — METFORMIN HCL 1000 MG PO TABS: 1000.0000 mg | ORAL_TABLET | Freq: Two times a day (BID) | ORAL | 1 refills | Status: DC

## 2022-07-24 ENCOUNTER — Ambulatory Visit
Admission: RE | Admit: 2022-07-24 | Discharge: 2022-07-24 | Disposition: A | Discharge status: Home / Self Care | Payer: Managed Care, Other (non HMO) | Source: Ambulatory Visit | Attending: Obstetrics and Gynecology | Admitting: Obstetrics and Gynecology

## 2022-07-24 DIAGNOSIS — Z1239 Encounter for other screening for malignant neoplasm of breast: Secondary | ICD-10-CM

## 2022-07-24 MED ORDER — GADOPICLENOL 0.5 MMOL/ML IV SOLN
9.0000 mL | Freq: Once | INTRAVENOUS | Status: AC | PRN
  Administered 2022-07-24: 9 mL via INTRAVENOUS

## 2022-07-25 ENCOUNTER — Other Ambulatory Visit: Payer: Self-pay | Admitting: Obstetrics and Gynecology

## 2022-07-25 DIAGNOSIS — R928 Other abnormal and inconclusive findings on diagnostic imaging of breast: Secondary | ICD-10-CM

## 2022-07-31 ENCOUNTER — Ambulatory Visit (INDEPENDENT_AMBULATORY_CARE_PROVIDER_SITE_OTHER): Payer: Managed Care, Other (non HMO) | Admitting: Nurse Practitioner

## 2022-07-31 ENCOUNTER — Ambulatory Visit: Payer: Managed Care, Other (non HMO) | Admitting: Nurse Practitioner

## 2022-07-31 ENCOUNTER — Encounter: Payer: Self-pay | Admitting: Nurse Practitioner

## 2022-07-31 VITALS — BP 124/82 | HR 83 | Resp 18 | Ht 62.5 in | Wt 187.0 lb

## 2022-07-31 DIAGNOSIS — R1011 Right upper quadrant pain: Secondary | ICD-10-CM

## 2022-07-31 DIAGNOSIS — R229 Localized swelling, mass and lump, unspecified: Secondary | ICD-10-CM | POA: Diagnosis not present

## 2022-07-31 NOTE — Progress Notes (Signed)
Established patient visit   Patient: Cassidy Gibson   DOB: Jul 14, 1971   51 y.o. Female  MRN: [id number] Visit Date: 07/31/2022   Chief Complaint  Patient presents with   Mass    Middle of sternum   Subjective    HPI HPI     Mass    Additional comments: Middle of sternum      Last edited by Gemma Payor, CMA on 07/31/2022  2:01 PM.      Acute visit -starts in sternum  -radiates around the right flank and into the right side of the back.  -increased GERD.  -started taking pepcid.  -improved GERD but still having that pain in sternum and right -has noted a knot in the center of her abdomen. Has been present since having breast reduction in 1998. -has felt this more prominently over past 2 weeks .   Medications: Outpatient Medications Prior to Visit  Medication Sig   blood glucose meter kit and supplies Dispense based on patient and insurance preference. Blood sugar testing to be done QD, fasting. (FOR ICD-10 E10.9, E11.9).   Carboxymethylcellulose Sodium (ARTIFICIAL TEARS OP) Place 1 drop into both eyes daily as needed (dry eyes).   fluticasone (FLONASE) 50 MCG/ACT nasal spray Place 2 sprays into both nostrils daily as needed for allergies.   levonorgestrel (MIRENA, 52 MG,) 20 MCG/24HR IUD 1 each by Intrauterine route once.   Loratadine 10 MG CAPS Take 10 mg by mouth daily as needed.   metFORMIN (GLUCOPHAGE) 1000 MG tablet Take 1 tablet (1,000 mg total) by mouth 2 (two) times daily with a meal.   tirzepatide (MOUNJARO) 5 MG/0.5ML Pen Inject 5 mg into the skin once a week.   VITAMIN D PO Take 1 tablet by mouth daily.   No facility-administered medications prior to visit.    Review of Systems  Constitutional:  Positive for appetite change. Negative for activity change, chills, fatigue and fever.  HENT:  Negative for congestion, postnasal drip, rhinorrhea, sinus pressure, sinus pain, sneezing and sore throat.   Eyes: Negative.   Respiratory:  Negative for cough,  chest tightness, shortness of breath and wheezing.   Cardiovascular:  Negative for chest pain and palpitations.  Gastrointestinal:  Positive for abdominal pain. Negative for constipation and vomiting.  Endocrine: Negative for cold intolerance, heat intolerance, polydipsia and polyuria.  Genitourinary:  Negative for dyspareunia, dysuria, flank pain, frequency and urgency.  Musculoskeletal:  Negative for arthralgias, back pain and myalgias.  Skin:  Negative for rash.  Allergic/Immunologic: Negative for environmental allergies.  Neurological:  Negative for dizziness, weakness and headaches.  Hematological:  Negative for adenopathy.  Psychiatric/Behavioral:  The patient is not nervous/anxious.     Last CBC Lab Results  Component Value Date   WBC 8.0 01/30/2022   HGB 15.3 01/30/2022   HCT 44.2 01/30/2022   MCV 89 01/30/2022   MCH 30.8 01/30/2022   RDW 12.6 01/30/2022   PLT 294 22/48/2500   Last metabolic panel Lab Results  Component Value Date   GLUCOSE 141 (H) 01/30/2022   NA 138 01/30/2022   K 4.6 01/30/2022   CL 100 01/30/2022   CO2 22 01/30/2022   BUN 13 01/30/2022   CREATININE 0.60 01/30/2022   EGFR 109 01/30/2022   CALCIUM 9.1 01/30/2022   PROT 6.8 01/30/2022   ALBUMIN 4.4 01/30/2022   LABGLOB 2.4 01/30/2022   AGRATIO 1.8 01/30/2022   BILITOT 0.5 01/30/2022   ALKPHOS 88 01/30/2022   AST 37 01/30/2022  ALT 60 (H) 01/30/2022   ANIONGAP 10 10/30/2021   Last lipids Lab Results  Component Value Date   CHOL 189 01/30/2022   HDL 63 01/30/2022   LDLCALC 109 (H) 01/30/2022   TRIG 94 01/30/2022   CHOLHDL 3.0 01/30/2022   Last hemoglobin A1c Lab Results  Component Value Date   HGBA1C 8.3 (H) 06/12/2022   Last thyroid functions Lab Results  Component Value Date   TSH 1.340 01/30/2022        Objective     Today's Vitals   07/31/22 1401  BP: 124/82  Pulse: 83  Resp: 18  SpO2: 95%  Weight: 187 lb (84.8 kg)  Height: 5' 2.5" (1.588 m)   Body mass index  is 33.66 kg/m.  BP Readings from Last 3 Encounters:  07/31/22 124/82  06/18/22 132/78  06/12/22 137/83    Wt Readings from Last 3 Encounters:  07/31/22 187 lb (84.8 kg)  06/18/22 195 lb 6.4 oz (88.6 kg)  06/12/22 195 lb 3.2 oz (88.5 kg)    Physical Exam Vitals and nursing note reviewed.  Constitutional:      Appearance: Normal appearance. She is well-developed.  HENT:     Head: Normocephalic and atraumatic.     Nose: Nose normal.     Mouth/Throat:     Mouth: Mucous membranes are moist.  Eyes:     Conjunctiva/sclera: Conjunctivae normal.     Pupils: Pupils are equal, round, and reactive to light.  Cardiovascular:     Rate and Rhythm: Normal rate and regular rhythm.     Pulses: Normal pulses.     Heart sounds: Normal heart sounds.  Pulmonary:     Effort: Pulmonary effort is normal.     Breath sounds: Normal breath sounds.  Abdominal:     General: Bowel sounds are normal. There is distension.     Palpations: Abdomen is soft. There is no mass.     Tenderness: There is abdominal tenderness. There is right CVA tenderness. There is no left CVA tenderness or rebound.     Hernia: No hernia is present.    Musculoskeletal:        General: Normal range of motion.     Cervical back: Normal range of motion and neck supple.  Lymphadenopathy:     Cervical: No cervical adenopathy.  Skin:    General: Skin is warm and dry.     Capillary Refill: Capillary refill takes less than 2 seconds.  Neurological:     General: No focal deficit present.     Mental Status: She is alert and oriented to person, place, and time.  Psychiatric:        Mood and Affect: Mood normal.        Behavior: Behavior normal.        Thought Content: Thought content normal.        Judgment: Judgment normal.      Assessment & Plan    1. RUQ pain Will get ultrasound of the abdomen (RUQ) for further evaluation. Refer to GI as indicated.  - US Abdomen Limited; Future  2. Localized superficial mass Slight  prominent area at base of sternum. Slightly tender. Will get ultrasound for further evaluation.  - US Abdomen Limited; Future   Problem List Items Addressed This Visit       Other   RUQ pain - Primary   Relevant Orders   US Abdomen Limited (Completed)   Localized superficial mass   Relevant Orders   US  Abdomen Limited (Completed)     Return for prn worsening or persistent symptoms.         Ronnell Freshwater, NP  [facility] at [facility] [phone] (phone) [phone] (fax)  [place]

## 2022-08-01 ENCOUNTER — Ambulatory Visit
Admission: RE | Admit: 2022-08-01 | Discharge: 2022-08-01 | Disposition: A | Discharge status: Home / Self Care | Payer: Managed Care, Other (non HMO) | Source: Ambulatory Visit | Attending: Obstetrics and Gynecology | Admitting: Obstetrics and Gynecology

## 2022-08-01 DIAGNOSIS — R928 Other abnormal and inconclusive findings on diagnostic imaging of breast: Secondary | ICD-10-CM

## 2022-08-13 ENCOUNTER — Ambulatory Visit
Admission: RE | Admit: 2022-08-13 | Discharge: 2022-08-13 | Disposition: A | Discharge status: Home / Self Care | Payer: Managed Care, Other (non HMO) | Source: Ambulatory Visit | Attending: Nurse Practitioner

## 2022-08-13 DIAGNOSIS — R229 Localized swelling, mass and lump, unspecified: Secondary | ICD-10-CM

## 2022-08-13 DIAGNOSIS — R1011 Right upper quadrant pain: Secondary | ICD-10-CM

## 2022-08-15 ENCOUNTER — Other Ambulatory Visit (HOSPITAL_COMMUNITY): Payer: Self-pay

## 2022-08-18 DIAGNOSIS — R1011 Right upper quadrant pain: Secondary | ICD-10-CM | POA: Insufficient documentation

## 2022-08-18 DIAGNOSIS — R229 Localized swelling, mass and lump, unspecified: Secondary | ICD-10-CM | POA: Insufficient documentation

## 2022-09-11 NOTE — Progress Notes (Signed)
Established patient visit  Virtual Visit via Telephone Note  I connected with Cassidy Gibson on 09/12/22 at  8:10 AM EST by telephone and verified that I am speaking with the correct person using two identifiers.  Location: Patient: home  Provider: [place] [facility] at [facility]       Patient: Cassidy Gibson   DOB: April 20, 1971   52 y.o. Female  MRN: [id number] Visit Date: 09/12/2022   Chief Complaint  Patient presents with   Follow-up   Diabetes   Subjective    HPI  Follow up visit  -diabetes with HgbA1c check  -most recent HgbA1c was 8.3 -started [facility] and is now taking 5 mg weekly  -today, HgbA1c is 6.7  -she has lost 15 pounds since starting on Mounjaro in October 2023.  -she states that she feels well and has no negative side effects associated with taking this medication.   -She denies chest pain, chest pressure, or shortness of breath. She denies headaches or visual disturbances. She denies abdominal pain, nausea, vomiting, or changes in bowel or bladder habits.    Medications: Outpatient Medications Prior to Visit  Medication Sig   blood glucose meter kit and supplies Dispense based on patient and insurance preference. Blood sugar testing to be done QD, fasting. (FOR ICD-10 E10.9, E11.9).   Carboxymethylcellulose Sodium (ARTIFICIAL TEARS OP) Place 1 drop into both eyes daily as needed (dry eyes).   fluticasone (FLONASE) 50 MCG/ACT nasal spray Place 2 sprays into both nostrils daily as needed for allergies.   levonorgestrel (MIRENA, 52 MG,) 20 MCG/24HR IUD 1 each by Intrauterine route once.   Loratadine 10 MG CAPS Take 10 mg by mouth daily as needed.   VITAMIN D PO Take 1 tablet by mouth daily.   [DISCONTINUED] metFORMIN (GLUCOPHAGE) 1000 MG tablet Take 1 tablet (1,000 mg total) by mouth 2 (two) times daily with a meal.   [DISCONTINUED] tirzepatide (MOUNJARO) 5 MG/0.5ML Pen Inject 5 mg into the skin once a week.   No facility-administered medications  prior to visit.    Review of Systems  Constitutional:  Negative for activity change, appetite change, chills, fatigue and fever.       Weight loss 15 pounds since starting on Mounjaro in October 2023.   HENT:  Negative for congestion, postnasal drip, rhinorrhea, sinus pressure, sinus pain, sneezing and sore throat.   Eyes: Negative.   Respiratory:  Negative for cough, chest tightness, shortness of breath and wheezing.   Cardiovascular:  Negative for chest pain and palpitations.  Gastrointestinal:  Negative for abdominal pain, constipation, diarrhea, nausea and vomiting.  Endocrine: Negative for cold intolerance, heat intolerance, polydipsia and polyuria.       Improved blood sugar  Genitourinary:  Negative for dyspareunia, dysuria, flank pain, frequency and urgency.  Musculoskeletal:  Negative for arthralgias, back pain and myalgias.  Skin:  Negative for rash.  Allergic/Immunologic: Negative for environmental allergies.  Neurological:  Negative for dizziness, weakness and headaches.  Hematological:  Negative for adenopathy.  Psychiatric/Behavioral:  The patient is not nervous/anxious.     Last CBC Lab Results  Component Value Date   WBC 8.0 01/30/2022   HGB 15.3 01/30/2022   HCT 44.2 01/30/2022   MCV 89 01/30/2022   MCH 30.8 01/30/2022   RDW 12.6 01/30/2022   PLT 294 42/35/3614   Last metabolic panel Lab Results  Component Value Date   GLUCOSE 141 (H) 01/30/2022   NA 138 01/30/2022   K 4.6 01/30/2022  CL 100 01/30/2022   CO2 22 01/30/2022   BUN 13 01/30/2022   CREATININE 0.60 01/30/2022   EGFR 109 01/30/2022   CALCIUM 9.1 01/30/2022   PROT 6.8 01/30/2022   ALBUMIN 4.4 01/30/2022   LABGLOB 2.4 01/30/2022   AGRATIO 1.8 01/30/2022   BILITOT 0.5 01/30/2022   ALKPHOS 88 01/30/2022   AST 37 01/30/2022   ALT 60 (H) 01/30/2022   ANIONGAP 10 10/30/2021   Last lipids Lab Results  Component Value Date   CHOL 189 01/30/2022   HDL 63 01/30/2022   LDLCALC 109 (H)  01/30/2022   TRIG 94 01/30/2022   CHOLHDL 3.0 01/30/2022   Last hemoglobin A1c Lab Results  Component Value Date   HGBA1C 6.7 09/12/2022   Last thyroid functions Lab Results  Component Value Date   TSH 1.340 01/30/2022        Objective     Today's Vitals   09/12/22 0811  BP: 119/79  Pulse: 85  Resp: 18  SpO2: 97%  Weight: 180 lb (81.6 kg)   Body mass index is 32.4 kg/m.  BP Readings from Last 3 Encounters:  09/12/22 119/79  07/31/22 124/82  06/18/22 132/78    Wt Readings from Last 3 Encounters:  09/12/22 180 lb (81.6 kg)  07/31/22 187 lb (84.8 kg)  06/18/22 195 lb 6.4 oz (88.6 kg)    Physical Exam   The patient is alert and oriented. She is pleasant and answers all questions appropriately. Breathing is non-labored. She is in no acute distress at this time.    Results for orders placed or performed in visit on 09/12/22  POCT HgB A1C  Result Value Ref Range   Hemoglobin A1C     HbA1c POC (<> result, manual entry) 6.7 4.0 - 5.6 %   HbA1c, POC (prediabetic range)     HbA1c, POC (controlled diabetic range)      Assessment & Plan    1. Type 2 diabetes mellitus without complication, without long-term current use of insulin (HCC) HgbA1c 6.7 today, down from 8.3. will increase Mounjaro to 7.5 mg weekly. Decrease metformin dosing to 500 mg twice daily. Continue decreased carbohydrates and sugar  in the diet and increased water. Recheck HgbA1c in 3 months.  - POCT HgB A1C - tirzepatide (MOUNJARO) 7.5 MG/0.5ML Pen; Inject 7.5 mg into the skin once a week.  Dispense: 6 mL; Refill: 2 - metFORMIN (GLUCOPHAGE) 1000 MG tablet; Take 0.5 tablets (500 mg total) by mouth 2 (two) times daily with a meal.  Dispense: 180 tablet; Refill: 1    Problem List Items Addressed This Visit       Endocrine   Type 2 diabetes mellitus without complication, without long-term current use of insulin ([place]) - Primary   Relevant Medications   tirzepatide (MOUNJARO) 7.5 MG/0.5ML Pen    metFORMIN (GLUCOPHAGE) 1000 MG tablet   Other Relevant Orders   POCT HgB A1C (Completed)     Return in about 3 months (around 12/12/2022) for diabetes with HgbA1c check.      I discussed the limitations, risks, security and privacy concerns of performing an evaluation and management service by telephone and the availability of in person appointments. I also discussed with the patient that there may be a patient responsible charge related to this service. The patient expressed understanding and agreed to proceed.     I discussed the assessment and treatment plan with the patient. The patient was provided an opportunity to ask questions and all were answered. The  patient agreed with the plan and demonstrated an understanding of the instructions.   The patient was advised to call back or seek an in-person evaluation if the symptoms worsen or if the condition fails to improve as anticipated.  I provided 15 minutes of non-face-to-face time during this encounter.   Ronnell Freshwater, NP    Ronnell Freshwater, NP  [facility] at [facility] [phone] (phone) [phone] (fax)  [place]

## 2022-09-12 ENCOUNTER — Encounter: Payer: Self-pay | Admitting: Nurse Practitioner

## 2022-09-12 ENCOUNTER — Ambulatory Visit (INDEPENDENT_AMBULATORY_CARE_PROVIDER_SITE_OTHER): Payer: Managed Care, Other (non HMO) | Admitting: Nurse Practitioner

## 2022-09-12 VITALS — BP 119/79 | HR 85 | Resp 18 | Wt 180.0 lb

## 2022-09-12 DIAGNOSIS — E119 Type 2 diabetes mellitus without complications: Secondary | ICD-10-CM

## 2022-09-12 LAB — POCT GLYCOSYLATED HEMOGLOBIN (HGB A1C): HbA1c POC (<> result, manual entry): 6.7 % (ref 4.0–5.6)

## 2022-09-12 MED ORDER — METFORMIN HCL 1000 MG PO TABS: 500.0000 mg | ORAL_TABLET | Freq: Two times a day (BID) | ORAL | 1 refills | Status: DC

## 2022-09-12 MED ORDER — TIRZEPATIDE 7.5 MG/0.5ML ~~LOC~~ SOAJ: 7.5000 mg | SUBCUTANEOUS | 2 refills | Status: DC

## 2022-11-22 NOTE — Progress Notes (Signed)
11/26/2022 Cassidy Gibson [id number] 1971/04/29  Referring provider: Ronnell Freshwater, NP Primary GI doctor: Dr. Silverio Decamp  ASSESSMENT AND PLAN:   Abdominal pain, right upper quadrant with history of elevated LFTS, 2.5 cm gallstone within the lumen of otherwise normal-appearing gallbladder in 2023 Type 2 diabetes started on [facility] October, symptoms started November. Possibly related to [facility] and worsening reflux/gastroparesis, better with Pepcid.   Will do 1 month trial of Protonix, check lipase Get right upper quadrant ultrasound that does not sound colicky but did have 2.5 cm gallstone noncontrast CT in 2023. Also sounds more like rib or costochondritis discomfort, will check vitamin D. Follow up 3 months  Constipation Recently increased to 7.5 in the last month, has had worsening constipation with this. Nothing prior. Can add on MiraLAX once daily.  Hepatic steatosis Check liver function, right upper quadrant ultrasound From history most likely hepatic steatosis, pending labs and ultrasound can consider further hepatocellular workup.  Vitamin D deficiency Possible costochondritis/xiphoid pain Will check vitamin D  Personal history of TA polyps Recall 7 years 11/2028  Patient Care Team: Ronnell Freshwater, NP as PCP - General (Family Medicine) Himmelrich, Bryson Ha, RD (Inactive) as Dietitian Marylynn Pearson, MD as Consulting Physician (Obstetrics and Gynecology)  HISTORY OF PRESENT ILLNESS: 52 y.o. female with a past medical history of diabetes, hepatic steatosis CT 2012, and others listed below presents for evaluation of right upper quadrant abdominal pain.   12/14/2021 colonoscopy Dr. Silverio Decamp for screening purposes, excellent prep 5 mm TA polyp sigmoid colon, nonbleeding external/internal hemorrhoids.  Recall 7 years 11/2028 10/31/2021 CT renal stone study for left lower quadrant pain 2 mm obstructing renal calculus within the mid left ureter, cholelithiasis with  2.5 cm gallstone within the lumen of otherwise normal-appearing gallbladder, no biliary dilatation, hepatic steatosis. 08/13/2022 right upper quadrant pain and palpable soft tissue mass at base of sternum no suspicious cystic or solid mass is seen at the site of palpable concern the base of sternum, this was a limited ultrasound did not see liver or gallbladder  01/30/2022 labs reviewed  normal alk phos 88, AST 37, ALT 60 has been elevated slightly since 2022.   Normal kidney, normal CBC.  She states thanksgiving day she had bad RUQ pain that radiated to mid AB, She states after she started to move around it was better. Took 20-30 mins to improve.  Has the pain 3-4 times a week can feel RUQ pain, can be nagging/burning but only one time she had severe pain that she had to lay down and take tylenol.  She has had GERD ,takes pepcid daily and if she forgets dose can have worsening RUQ pain/reflux. Pain not associated with eating.  No associated nausea, fever, chills, change in bowel habits.  Tylenol and movements help some.  No family history of GB issues.  Rare NSAIDS. No ETOH, no smoking.  Has been on mounjaro in Oct and has lost 20 lbs since then.    She  reports that she has never smoked. She has never used smokeless tobacco. She reports that she does not drink alcohol and does not use drugs.  RELEVANT LABS AND IMAGING: CBC    Component Value Date/Time   WBC 8.0 01/30/2022 0833   WBC 9.6 10/30/2021 2320   RBC 4.96 01/30/2022 0833   RBC 4.76 10/30/2021 2320   HGB 15.3 01/30/2022 0833   HCT 44.2 01/30/2022 0833   PLT 294 01/30/2022 0833   MCV 89 01/30/2022 0833  MCH 30.8 01/30/2022 0833   MCH 29.6 10/30/2021 2320   MCHC 34.6 01/30/2022 0833   MCHC 33.2 10/30/2021 2320   RDW 12.6 01/30/2022 0833   LYMPHSABS 2.4 01/30/2022 0833   MONOABS 0.6 05/28/2021 1042   EOSABS 0.5 (H) 01/30/2022 0833   BASOSABS 0.1 01/30/2022 0833   Recent Labs    01/30/22 0833  HGB 15.3     CMP      Component Value Date/Time   NA 138 01/30/2022 0833   K 4.6 01/30/2022 0833   CL 100 01/30/2022 0833   CO2 22 01/30/2022 0833   GLUCOSE 141 (H) 01/30/2022 0833   GLUCOSE 180 (H) 10/30/2021 2320   BUN 13 01/30/2022 0833   CREATININE 0.60 01/30/2022 0833   CALCIUM 9.1 01/30/2022 0833   PROT 6.8 01/30/2022 0833   ALBUMIN 4.4 01/30/2022 0833   AST 37 01/30/2022 0833   ALT 60 (H) 01/30/2022 0833   ALKPHOS 88 01/30/2022 0833   BILITOT 0.5 01/30/2022 0833   GFRNONAA >60 10/30/2021 2320      Latest Ref Rng & Units 01/30/2022    8:33 AM 10/30/2021   11:20 PM 05/29/2021    4:09 AM  Hepatic Function  Total Protein 6.0 - 8.5 g/dL 6.8  7.0  6.7   Albumin 3.8 - 4.8 g/dL 4.4  4.4  3.7   AST 0 - 40 IU/L 37  30  33   ALT 0 - 32 IU/L 60  64  61   Alk Phosphatase 44 - 121 IU/L 88  64  57   Total Bilirubin 0.0 - 1.2 mg/dL 0.5  0.5  0.5       Current Medications:   Current Outpatient Medications (Endocrine & Metabolic):    levonorgestrel (MIRENA, 52 MG,) 20 MCG/24HR IUD, 1 each by Intrauterine route once.   metFORMIN (GLUCOPHAGE) 1000 MG tablet, Take 0.5 tablets (500 mg total) by mouth 2 (two) times daily with a meal.   tirzepatide (MOUNJARO) 7.5 MG/0.5ML Pen, Inject 7.5 mg into the skin once a week.   Current Outpatient Medications (Respiratory):    fluticasone (FLONASE) 50 MCG/ACT nasal spray, Place 2 sprays into both nostrils daily as needed for allergies.   Loratadine 10 MG CAPS, Take 10 mg by mouth daily as needed.    Current Outpatient Medications (Other):    blood glucose meter kit and supplies, Dispense based on patient and insurance preference. Blood sugar testing to be done QD, fasting. (FOR ICD-10 E10.9, E11.9).   Carboxymethylcellulose Sodium (ARTIFICIAL TEARS OP), Place 1 drop into both eyes daily as needed (dry eyes).   pantoprazole (PROTONIX) 40 MG tablet, Take 1 tablet (40 mg total) by mouth daily.   VITAMIN D PO, Take 1 tablet by mouth daily.  Medical History:  Past  Medical History:  Diagnosis Date   Diabetes mellitus without complication    on meds   Hepatic steatosis 05/24/2021   Noted initially on abd CT 2012   PONV (postoperative nausea and vomiting)    Prediabetes    Seasonal allergies    Allergies: No Known Allergies   Surgical History:  She  has a past surgical history that includes Cesarean section (08/28/1999); Adenoidectomy (08/27/1976); Breast reduction surgery (08/27/1996); Tympanostomy tube placement (08/27/1977); and Wisdom tooth extraction (1990). Family History:  Her family history includes Breast cancer in her mother; Cancer in her mother; Diabetes in her father, maternal aunt, maternal grandmother, maternal uncle, paternal aunt, paternal grandmother, and paternal uncle; GER disease in her mother; Heart  disease in her brother and paternal grandfather; Hyperlipidemia in her brother; Hypertension in her brother and father.  REVIEW OF SYSTEMS  : All other systems reviewed and negative except where noted in the History of Present Illness.  PHYSICAL EXAM: BP 120/70   Pulse 78   Ht 5' 2.4" (1.585 m)   Wt 179 lb (81.2 kg)   SpO2 98%   BMI 32.32 kg/m  General Appearance: Well nourished, in no apparent distress. Head:   Normocephalic and atraumatic. Eyes:  sclerae anicteric,conjunctive pink  Respiratory: Respiratory effort normal, BS equal bilaterally without rales, rhonchi, wheezing. Cardio: RRR with no MRGs. Peripheral pulses intact.  Abdomen: Soft,  Obese ,active bowel sounds. mild tenderness in the epigastrium. Without guarding and Without rebound. No masses. Rectal: Not evaluated Musculoskeletal: Full ROM, Normal gait. Without edema. Skin:  Dry and intact without significant lesions or rashes Neuro: Alert and  oriented x4;  No focal deficits. Psych:  Cooperative. Normal mood and affect.    Vladimir Crofts, PA-C 12:13 PM

## 2022-11-26 ENCOUNTER — Ambulatory Visit (INDEPENDENT_AMBULATORY_CARE_PROVIDER_SITE_OTHER): Payer: Managed Care, Other (non HMO) | Admitting: Physician Assistant

## 2022-11-26 ENCOUNTER — Encounter: Payer: Self-pay | Admitting: Physician Assistant

## 2022-11-26 ENCOUNTER — Other Ambulatory Visit (INDEPENDENT_AMBULATORY_CARE_PROVIDER_SITE_OTHER): Payer: Managed Care, Other (non HMO)

## 2022-11-26 VITALS — BP 120/70 | HR 78 | Ht 62.4 in | Wt 179.0 lb

## 2022-11-26 DIAGNOSIS — R1011 Right upper quadrant pain: Secondary | ICD-10-CM

## 2022-11-26 DIAGNOSIS — R748 Abnormal levels of other serum enzymes: Secondary | ICD-10-CM | POA: Diagnosis not present

## 2022-11-26 DIAGNOSIS — R0789 Other chest pain: Secondary | ICD-10-CM

## 2022-11-26 DIAGNOSIS — E559 Vitamin D deficiency, unspecified: Secondary | ICD-10-CM

## 2022-11-26 DIAGNOSIS — Z860101 Personal history of adenomatous and serrated colon polyps: Secondary | ICD-10-CM

## 2022-11-26 DIAGNOSIS — K760 Fatty (change of) liver, not elsewhere classified: Secondary | ICD-10-CM

## 2022-11-26 DIAGNOSIS — Z86010 Personal history of colonic polyps: Secondary | ICD-10-CM

## 2022-11-26 DIAGNOSIS — R7303 Prediabetes: Secondary | ICD-10-CM | POA: Insufficient documentation

## 2022-11-26 LAB — CBC WITH DIFFERENTIAL/PLATELET
Basophils Absolute: 0.1 10*3/uL (ref 0.0–0.1)
Basophils Relative: 1.1 % (ref 0.0–3.0)
Eosinophils Absolute: 0.4 10*3/uL (ref 0.0–0.7)
Eosinophils Relative: 5 % (ref 0.0–5.0)
HCT: 44.4 % (ref 36.0–46.0)
Hemoglobin: 15 g/dL (ref 12.0–15.0)
Lymphocytes Relative: 40.1 % (ref 12.0–46.0)
Lymphs Abs: 2.9 10*3/uL (ref 0.7–4.0)
MCHC: 33.7 g/dL (ref 30.0–36.0)
MCV: 88.9 fl (ref 78.0–100.0)
Monocytes Absolute: 0.6 10*3/uL (ref 0.1–1.0)
Monocytes Relative: 8.7 % (ref 3.0–12.0)
Neutro Abs: 3.3 10*3/uL (ref 1.4–7.7)
Neutrophils Relative %: 45.1 % (ref 43.0–77.0)
Platelets: 311 10*3/uL (ref 150.0–400.0)
RBC: 5 Mil/uL (ref 3.87–5.11)
RDW: 13.2 % (ref 11.5–15.5)
WBC: 7.3 10*3/uL (ref 4.0–10.5)

## 2022-11-26 LAB — LIPASE: Lipase: 22 U/L (ref 11.0–59.0)

## 2022-11-26 LAB — COMPREHENSIVE METABOLIC PANEL
ALT: 52 U/L — ABNORMAL HIGH (ref 0–35)
AST: 31 U/L (ref 0–37)
Albumin: 4.5 g/dL (ref 3.5–5.2)
Alkaline Phosphatase: 74 U/L (ref 39–117)
BUN: 9 mg/dL (ref 6–23)
CO2: 28 mEq/L (ref 19–32)
Calcium: 9.8 mg/dL (ref 8.4–10.5)
Chloride: 104 mEq/L (ref 96–112)
Creatinine, Ser: 0.66 mg/dL (ref 0.40–1.20)
GFR: 101.89 mL/min (ref >60.00)
Glucose, Bld: 110 mg/dL — ABNORMAL HIGH (ref 70–99)
Potassium: 4.2 mEq/L (ref 3.5–5.1)
Sodium: 138 mEq/L (ref 135–145)
Total Bilirubin: 0.5 mg/dL (ref 0.2–1.2)
Total Protein: 7.5 g/dL (ref 6.0–8.3)

## 2022-11-26 MED ORDER — PANTOPRAZOLE SODIUM 40 MG PO TBEC: 40.0000 mg | DELAYED_RELEASE_TABLET | Freq: Every day | ORAL | 3 refills | Status: DC

## 2022-11-26 NOTE — Patient Instructions (Signed)
You have been scheduled for an abdominal ultrasound at [facility] (1st floor of hospital) on 12/03/2022 at 8:30am. Please arrive 15 minutes prior to your appointment for registration. Make certain not to have anything to eat or drink 8 hours prior to your appointment. Should you need to reschedule your appointment, please contact radiology at [phone]. This test typically takes about 30 minutes to perform.  You've been scheduled for a follow up on July 3rd, 2024 at 10:00am with Vicie Mutters PA-C.  Your provider has requested that you go to the basement level for lab work before leaving today. Press "B" on the elevator. The lab is located at the first door on the left as you exit the elevator.  Please take your proton pump inhibitor medication, do this for one month and then go back to the pepcid.   Avoid spicy and acidic foods Avoid fatty foods Limit your intake of coffee, tea, alcohol, and carbonated drinks Work to maintain a healthy weight Keep the head of the bed elevated at least 3 inches with blocks or a wedge pillow if you are having any nighttime symptoms Stay upright for 2 hours after eating Avoid meals and snacks three to four hours before bedtime   Gastroparesis Please do small frequent meals like 4-6 meals a day.  Eat and drink liquids at separate times.  Avoid high fiber foods, cook your vegetables, avoid high fat food.  Suggest spreading protein throughout the day (greek yogurt, glucerna, soft meat, milk, eggs) Choose soft foods that you can mash with a fork When you are more symptomatic, change to pureed foods foods and liquids.  Consider reading "Living well with Gastroparesis" by Lambert Keto Gastroparesis is a condition in which food takes longer than normal to empty from the stomach. This condition is also known as delayed gastric emptying. It is usually a long-term (chronic) condition. There is no cure, but there are treatments and things that you can  do at home to help relieve symptoms. Treating the underlying condition that causes gastroparesis can also help relieve symptoms What are the causes? In many cases, the cause of this condition is not known. Possible causes include: A hormone (endocrine) disorder, such as hypothyroidism or diabetes. A nervous system disease, such as Parkinson's disease or multiple sclerosis. Cancer, infection, or surgery that affects the stomach or vagus nerve. The vagus nerve runs from your chest, through your neck, and to the lower part of your brain. A connective tissue disorder, such as scleroderma. Certain medicines. What increases the risk? You are more likely to develop this condition if: You have certain disorders or diseases. These may include: An endocrine disorder. An eating disorder. Amyloidosis. Scleroderma. Parkinson's disease. Multiple sclerosis. Cancer or infection of the stomach or the vagus nerve. You have had surgery on your stomach or vagus nerve. You take certain medicines. You are female. What are the signs or symptoms? Symptoms of this condition include: Feeling full after eating very little or a loss of appetite. Nausea, vomiting, or heartburn. Bloating of your abdomen. Inconsistent blood sugar (glucose) levels on blood tests. Unexplained weight loss. Acid from the stomach coming up into the esophagus (gastroesophageal reflux). Sudden tightening (spasm) of the stomach, which can be painful. Symptoms may come and go. Some people may not notice any symptoms. How is this diagnosed? This condition is diagnosed with tests, such as: Tests that check how long it takes food to move through the stomach and intestines. These tests include: Upper gastrointestinal (GI) series.  For this test, you drink a liquid that shows up well on X-rays, and then X-rays are taken of your intestines. Gastric emptying scintigraphy. For this test, you eat food that contains a small amount of radioactive  material, and then scans are taken. Wireless capsule GI monitoring system. For this test, you swallow a pill (capsule) that records information about how foods and fluid move through your stomach. Gastric manometry. For this test, a tube is passed down your throat and into your stomach to measure electrical and muscular activity. Endoscopy. For this test, a long, thin tube with a camera and light on the end is passed down your throat and into your stomach to check for problems in your stomach lining. Ultrasound. This test uses sound waves to create images of the inside of your body. This can help rule out gallbladder disease or pancreatitis as a cause of your symptoms. How is this treated? There is no cure for this condition, but treatment and home care may relieve symptoms. Treatment may include: Treating the underlying cause. Managing your symptoms by making changes to your diet and exercise habits. Taking medicines to control nausea and vomiting and to stimulate stomach muscles. Getting food through a feeding tube in the hospital. This may be done in severe cases. Having surgery to insert a device called a gastric electrical stimulator into your body. This device helps improve stomach emptying and control nausea and vomiting. Follow these instructions at home: Take over-the-counter and prescription medicines only as told by your health care provider. Follow instructions from your health care provider about eating or drinking restrictions. Your health care provider may recommend that you: Eat smaller meals more often. Eat low-fat foods. Eat low-fiber forms of high-fiber foods. For example, eat cooked vegetables instead of raw vegetables. Have only liquid foods instead of solid foods. Liquid foods are easier to digest. Drink enough fluid to keep your urine pale yellow. Exercise as often as told by your health care provider. Keep all follow-up visits. This is important. Contact a health care  provider if you: Notice that your symptoms do not improve with treatment. Have new symptoms. Get help right away if you: Have severe pain in your abdomen that does not improve with treatment. Have nausea that is severe or does not go away. Vomit every time you drink fluids. Summary Gastroparesis is a long-term (chronic) condition in which food takes longer than normal to empty from the stomach. Symptoms include nausea, vomiting, heartburn, bloating of your abdomen, and loss of appetite. Eating smaller portions, low-fat foods, and low-fiber forms of high-fiber foods may help you manage your symptoms. Get help right away if you have severe pain in your abdomen. This information is not intended to replace advice given to you by your health care provider. Make sure you discuss any questions you have with your health care provider. Document Revised: 12/21/2019 Document Reviewed: 12/21/2019 Elsevier Patient Education  [place].  __________________  If your blood pressure at your visit was 140/90 or greater, please contact your primary care physician to follow up on this.  _______________________________________________________  If you are age 58 or older, your body mass index should be between 23-30. Your Body mass index is 32.32 kg/m. If this is out of the aforementioned range listed, please consider follow up with your Primary Care Provider.  If you are age 26 or younger, your body mass index should be between 19-25. Your Body mass index is 32.32 kg/m. If this is out of the  aformentioned range listed, please consider follow up with your Primary Care Provider.   ________________________________________________________  The [place] [facility] providers would like to encourage you to use [facility] to communicate with providers for non-urgent requests or questions.  Due to long hold times on the telephone, sending your provider a message by [facility] may be a faster and more efficient way to get  a response.  Please allow 48 business hours for a response.  Please remember that this is for non-urgent requests.  _______________________________________________________ It was a pleasure to see you today!  Thank you for trusting me with your gastrointestinal care!

## 2022-11-30 LAB — VITAMIN D 1,25 DIHYDROXY
Vitamin D 1, 25 (OH)2 Total: 52 pg/mL (ref 18–72)
Vitamin D2 1, 25 (OH)2: <8 pg/mL
Vitamin D3 1, 25 (OH)2: 52 pg/mL

## 2022-12-03 ENCOUNTER — Ambulatory Visit (HOSPITAL_COMMUNITY)
Admission: RE | Admit: 2022-12-03 | Discharge: 2022-12-03 | Disposition: A | Discharge status: Home / Self Care | Payer: Managed Care, Other (non HMO) | Source: Ambulatory Visit | Attending: Physician Assistant | Admitting: Physician Assistant

## 2022-12-03 DIAGNOSIS — R1011 Right upper quadrant pain: Secondary | ICD-10-CM | POA: Diagnosis present

## 2022-12-03 DIAGNOSIS — K760 Fatty (change of) liver, not elsewhere classified: Secondary | ICD-10-CM | POA: Diagnosis present

## 2022-12-11 NOTE — Progress Notes (Signed)
Referral faxed to [facility] for possible gallbladder surgery. 

## 2022-12-11 NOTE — Progress Notes (Signed)
Given the size of gallstone, please refer to [facility] for cholecystectomy.  Large gallstone is likely etiology of right upper quadrant abdominal pain.  Reviewed and agree with documentation and assessment and plan. Iona Beard , MD

## 2022-12-11 NOTE — Progress Notes (Signed)
Pt knows referral has been sent to [facility]. States at this point she is not ready to schedule surgery. Discussed with her she can decide and let [facility] know if she wants to schedule the appt.

## 2022-12-13 ENCOUNTER — Ambulatory Visit (INDEPENDENT_AMBULATORY_CARE_PROVIDER_SITE_OTHER): Payer: Managed Care, Other (non HMO) | Admitting: Nurse Practitioner

## 2022-12-13 ENCOUNTER — Encounter: Payer: Self-pay | Admitting: Nurse Practitioner

## 2022-12-13 VITALS — BP 117/80 | HR 79 | Ht 62.4 in | Wt 172.4 lb

## 2022-12-13 DIAGNOSIS — E119 Type 2 diabetes mellitus without complications: Secondary | ICD-10-CM | POA: Diagnosis not present

## 2022-12-13 DIAGNOSIS — Z789 Other specified health status: Secondary | ICD-10-CM | POA: Insufficient documentation

## 2022-12-13 DIAGNOSIS — Z6831 Body mass index (BMI) 31.0-31.9, adult: Secondary | ICD-10-CM

## 2022-12-13 LAB — POCT GLYCOSYLATED HEMOGLOBIN (HGB A1C): HbA1c POC (<> result, manual entry): 5.7 % (ref 4.0–5.6)

## 2022-12-13 MED ORDER — TIRZEPATIDE 10 MG/0.5ML ~~LOC~~ SOAJ: 10.0000 mg | SUBCUTANEOUS | 1 refills | Status: DC

## 2022-12-13 NOTE — Assessment & Plan Note (Signed)
Patient has been on crestor in the past. Caused her to have myalgia and malaise. She does have mild dyslipidemia which is controlled with diet.

## 2022-12-13 NOTE — Progress Notes (Signed)
Established patient visit   Patient: Cassidy Gibson   DOB: [birth year]   52 y.o. Female  MRN: [id number] Visit Date: 12/13/2022   Chief Complaint  Patient presents with   Medical Management of Chronic Issues   Subjective    HPI  Follow up  -DM 2  -currently on Mounjaro 7.5 mg weekly.  -continues metformin 500 mg twice daily  -HgbA1c 5.7 today.  -weight loss of 8 pounds since last visit  -has noted mild constipation and dry mouth.  -no other concerns or complaints today.  -She denies chest pain, chest pressure, or shortness of breath. She denies headaches or visual disturbances. She denies abdominal pain, nausea, vomiting, or changes in bowel or bladder habits.     Medications: Outpatient Medications Prior to Visit  Medication Sig   blood glucose meter kit and supplies Dispense based on patient and insurance preference. Blood sugar testing to be done QD, fasting. (FOR ICD-10 E10.9, E11.9).   Carboxymethylcellulose Sodium (ARTIFICIAL TEARS OP) Place 1 drop into both eyes daily as needed (dry eyes).   fluticasone (FLONASE) 50 MCG/ACT nasal spray Place 2 sprays into both nostrils daily as needed for allergies.   levonorgestrel (MIRENA, 52 MG,) 20 MCG/24HR IUD 1 each by Intrauterine route once.   Loratadine 10 MG CAPS Take 10 mg by mouth daily as needed.   metFORMIN (GLUCOPHAGE) 1000 MG tablet Take 0.5 tablets (500 mg total) by mouth 2 (two) times daily with a meal.   pantoprazole (PROTONIX) 40 MG tablet Take 1 tablet (40 mg total) by mouth daily.   VITAMIN D PO Take 1 tablet by mouth daily.   [DISCONTINUED] tirzepatide (MOUNJARO) 7.5 MG/0.5ML Pen Inject 7.5 mg into the skin once a week.   No facility-administered medications prior to visit.    Review of Systems See HPI    Last CBC Lab Results  Component Value Date   WBC 7.3 11/26/2022   HGB 15.0 11/26/2022   HCT 44.4 11/26/2022   MCV 88.9 11/26/2022   MCH 30.8 01/30/2022   RDW 13.2 11/26/2022   PLT 311.0 11/26/2022    Last metabolic panel Lab Results  Component Value Date   GLUCOSE 110 (H) 11/26/2022   NA 138 11/26/2022   K 4.2 11/26/2022   CL 104 11/26/2022   CO2 28 11/26/2022   BUN 9 11/26/2022   CREATININE 0.66 11/26/2022   EGFR 109 01/30/2022   CALCIUM 9.8 11/26/2022   PROT 7.5 11/26/2022   ALBUMIN 4.5 11/26/2022   LABGLOB 2.4 01/30/2022   AGRATIO 1.8 01/30/2022   BILITOT 0.5 11/26/2022   ALKPHOS 74 11/26/2022   AST 31 11/26/2022   ALT 52 (H) 11/26/2022   ANIONGAP 10 10/30/2021   Last lipids Lab Results  Component Value Date   CHOL 189 01/30/2022   HDL 63 01/30/2022   LDLCALC 109 (H) 01/30/2022   TRIG 94 01/30/2022   CHOLHDL 3.0 01/30/2022   Last hemoglobin A1c Lab Results  Component Value Date   HGBA1C 5.7 12/13/2022   Last thyroid functions Lab Results  Component Value Date   TSH 1.340 01/30/2022        Objective     Today's Vitals   12/13/22 0811  BP: 117/80  Pulse: 79  SpO2: 95%  Weight: 172 lb 6.4 oz (78.2 kg)  Height: 5' 2.4" (1.585 m)   Body mass index is 31.13 kg/m.  BP Readings from Last 3 Encounters:  12/13/22 117/80  11/26/22 120/70  09/12/22 119/79    Wt Readings  from Last 3 Encounters:  12/13/22 172 lb 6.4 oz (78.2 kg)  11/26/22 179 lb (81.2 kg)  09/12/22 180 lb (81.6 kg)    Physical Exam Vitals and nursing note reviewed.  Constitutional:      Appearance: Normal appearance. She is well-developed.  HENT:     Head: Normocephalic and atraumatic.     Nose: Nose normal.     Mouth/Throat:     Mouth: Mucous membranes are moist.     Pharynx: Oropharynx is clear.  Eyes:     Extraocular Movements: Extraocular movements intact.     Conjunctiva/sclera: Conjunctivae normal.     Pupils: Pupils are equal, round, and reactive to light.  Neck:     Vascular: No carotid bruit.  Cardiovascular:     Rate and Rhythm: Normal rate and regular rhythm.     Pulses: Normal pulses.     Heart sounds: Normal heart sounds.  Pulmonary:     Effort:  Pulmonary effort is normal.     Breath sounds: Normal breath sounds.  Abdominal:     Palpations: Abdomen is soft.  Musculoskeletal:        General: Normal range of motion.     Cervical back: Normal range of motion and neck supple.  Lymphadenopathy:     Cervical: No cervical adenopathy.  Skin:    General: Skin is warm and dry.     Capillary Refill: Capillary refill takes less than 2 seconds.  Neurological:     General: No focal deficit present.     Mental Status: She is alert and oriented to person, place, and time.  Psychiatric:        Mood and Affect: Mood normal.        Behavior: Behavior normal.        Thought Content: Thought content normal.        Judgment: Judgment normal.     Results for orders placed or performed in visit on 12/13/22  POCT glycosylated hemoglobin (Hb A1C)  Result Value Ref Range   Hemoglobin A1C     HbA1c POC (<> result, manual entry) 5.7 4.0 - 5.6 %   HbA1c, POC (prediabetic range)     HbA1c, POC (controlled diabetic range)      Assessment & Plan    Type 2 diabetes mellitus without complication, without long-term current use of insulin Assessment & Plan: HgbA1c 5.7 today. Increase dose Mounjaro to 10 mg weekly. For now, decrease Metformin to 500 mg once daily. Once started on increased dose Mounjaro, may cut out metformin altogether. Recheck HgbA1c prior to next visit in 3 months.   Orders: -     POCT glycosylated hemoglobin (Hb A1C) -     Tirzepatide; Inject 10 mg into the skin once a week.  Dispense: 6 mL; Refill: 1  Statin intolerance Assessment & Plan: Patient has been on crestor in the past. Caused her to have myalgia and malaise. She does have mild dyslipidemia which is controlled with diet.    BMI 31.0-31.9,adult Assessment & Plan: Continue with low calorie, high-protein diet. Incorporate exercise into daily activities.        Return in about 4 weeks (around 01/10/2023) for health maintenance exam, FBW a week prior to visit. ok to  schedule in f/u slot.         Carlean Jews, NP  [facility] at [facility] [phone] (phone) [phone] (fax)  [facility]

## 2022-12-13 NOTE — Assessment & Plan Note (Signed)
Continue with low calorie, high-protein diet. Incorporate exercise into daily activities.   

## 2022-12-13 NOTE — Assessment & Plan Note (Signed)
HgbA1c 5.7 today. Increase dose Mounjaro to 10 mg weekly. For now, decrease Metformin to 500 mg once daily. Once started on increased dose Mounjaro, may cut out metformin altogether. Recheck HgbA1c prior to next visit in 3 months.

## 2022-12-19 ENCOUNTER — Encounter: Payer: Self-pay | Admitting: Nurse Practitioner

## 2022-12-19 DIAGNOSIS — E119 Type 2 diabetes mellitus without complications: Secondary | ICD-10-CM

## 2022-12-20 MED ORDER — TIRZEPATIDE 10 MG/0.5ML ~~LOC~~ SOAJ: 10.0000 mg | SUBCUTANEOUS | 1 refills | Status: DC

## 2022-12-20 NOTE — Addendum Note (Signed)
Addended by: Tonny Bollman on: 12/20/2022 11:07 AM   Modules accepted: Orders

## 2023-01-26 HISTORY — PX: CHOLECYSTECTOMY, LAPAROSCOPIC: SHX56

## 2023-02-14 NOTE — Progress Notes (Deleted)
02/14/2023 Cassidy Gibson [id number] Jan 02, 1971  Referring provider: Carlean Jews, NP Primary GI doctor: Dr. Lavon Paganini  ASSESSMENT AND PLAN:   Abdominal pain, right upper quadrant with history of elevated LFTS, 2.5 cm gallstone within the lumen of otherwise normal-appearing gallbladder in 2023 Type 2 diabetes started on [facility] October, symptoms started November. Possibly related to [facility] and worsening reflux/gastroparesis, better with Pepcid.   Will do 1 month trial of Protonix, check lipase Get right upper quadrant ultrasound that does not sound colicky but did have 2.5 cm gallstone noncontrast CT in 2023. Also sounds more like rib or costochondritis discomfort, will check vitamin D. Follow up 3 months  Constipation Recently increased to 7.5 in the last month, has had worsening constipation with this. Nothing prior. Can add on MiraLAX once daily.  Hepatic steatosis Check liver function, right upper quadrant ultrasound From history most likely hepatic steatosis, pending labs and ultrasound can consider further hepatocellular workup.  Vitamin D deficiency Possible costochondritis/xiphoid pain Will check vitamin D  Personal history of TA polyps Recall 7 years 11/2028  Patient Care Team: Carlean Jews, NP as PCP - General (Family Medicine) Himmelrich, Loree Fee, RD (Inactive) as Dietitian Zelphia Cairo, MD as Consulting Physician (Obstetrics and Gynecology)  HISTORY OF PRESENT ILLNESS: 52 y.o. female with a past medical history of diabetes, hepatic steatosis CT 2012, and others listed below presents for evaluation of right upper quadrant abdominal pain.   12/14/2021 colonoscopy Dr. Lavon Paganini for screening purposes, excellent prep 5 mm TA polyp sigmoid colon, nonbleeding external/internal hemorrhoids.  Recall 7 years 11/2028 10/31/2021 CT renal stone study for left lower quadrant pain 2 mm obstructing renal calculus within the mid left ureter, cholelithiasis with  2.5 cm gallstone within the lumen of otherwise normal-appearing gallbladder, no biliary dilatation, hepatic steatosis. 08/13/2022 right upper quadrant pain and palpable soft tissue mass at base of sternum no suspicious cystic or solid mass is seen at the site of palpable concern the base of sternum, this was a limited ultrasound did not see liver or gallbladder 11/26/2022 office visit with myself for right upper quadrant abdominal pain Labs showed normal CBC, ALT 52, AST 31, alk phos 74 negative lipase vitamin D normal.  Started on Protonix 12/03/2022 RUQ [place] for abdominal pain shows cholelithiasis measuring 2.3 cm without acute cholecystitis steatosis Due to size of gallstone referred to general surgery for evaluation. 01/02/2023 office visit at [facility] with Dr. Magnus Ivan scheduled for laparoscopic cholecystectomy 02/15/2023  01/30/2022 labs reviewed  normal alk phos 88, AST 37, ALT 60 has been elevated slightly since 2022.   Normal kidney, normal CBC.  She states thanksgiving day she had bad RUQ pain that radiated to mid AB, She states after she started to move around it was better. Took 20-30 mins to improve.  Has the pain 3-4 times a week can feel RUQ pain, can be nagging/burning but only one time she had severe pain that she had to lay down and take tylenol.  She has had GERD ,takes pepcid daily and if she forgets dose can have worsening RUQ pain/reflux. Pain not associated with eating.  No associated nausea, fever, chills, change in bowel habits.  Tylenol and movements help some.  No family history of GB issues.  Rare NSAIDS. No ETOH, no smoking.  Has been on mounjaro in Oct and has lost 20 lbs since then.    She  reports that she has never smoked. She has never used smokeless tobacco. She reports that she  does not drink alcohol and does not use drugs.  RELEVANT LABS AND IMAGING: CBC    Component Value Date/Time   WBC 7.3 11/26/2022 1150   RBC 5.00 11/26/2022 1150   HGB 15.0 11/26/2022 1150    HGB 15.3 01/30/2022 0833   HCT 44.4 11/26/2022 1150   HCT 44.2 01/30/2022 0833   PLT 311.0 11/26/2022 1150   PLT 294 01/30/2022 0833   MCV 88.9 11/26/2022 1150   MCV 89 01/30/2022 0833   MCH 30.8 01/30/2022 0833   MCH 29.6 10/30/2021 2320   MCHC 33.7 11/26/2022 1150   RDW 13.2 11/26/2022 1150   RDW 12.6 01/30/2022 0833   LYMPHSABS 2.9 11/26/2022 1150   LYMPHSABS 2.4 01/30/2022 0833   MONOABS 0.6 11/26/2022 1150   EOSABS 0.4 11/26/2022 1150   EOSABS 0.5 (H) 01/30/2022 0833   BASOSABS 0.1 11/26/2022 1150   BASOSABS 0.1 01/30/2022 0833   Recent Labs    11/26/22 1150  HGB 15.0     CMP     Component Value Date/Time   NA 138 11/26/2022 1150   NA 138 01/30/2022 0833   K 4.2 11/26/2022 1150   CL 104 11/26/2022 1150   CO2 28 11/26/2022 1150   GLUCOSE 110 (H) 11/26/2022 1150   BUN 9 11/26/2022 1150   BUN 13 01/30/2022 0833   CREATININE 0.66 11/26/2022 1150   CALCIUM 9.8 11/26/2022 1150   PROT 7.5 11/26/2022 1150   PROT 6.8 01/30/2022 0833   ALBUMIN 4.5 11/26/2022 1150   ALBUMIN 4.4 01/30/2022 0833   AST 31 11/26/2022 1150   ALT 52 (H) 11/26/2022 1150   ALKPHOS 74 11/26/2022 1150   BILITOT 0.5 11/26/2022 1150   BILITOT 0.5 01/30/2022 0833   GFRNONAA >60 10/30/2021 2320      Latest Ref Rng & Units 11/26/2022   11:50 AM 01/30/2022    8:33 AM 10/30/2021   11:20 PM  Hepatic Function  Total Protein 6.0 - 8.3 g/dL 7.5  6.8  7.0   Albumin 3.5 - 5.2 g/dL 4.5  4.4  4.4   AST 0 - 37 U/L 31  37  30   ALT 0 - 35 U/L 52  60  64   Alk Phosphatase 39 - 117 U/L 74  88  64   Total Bilirubin 0.2 - 1.2 mg/dL 0.5  0.5  0.5       Current Medications:   Current Outpatient Medications (Endocrine & Metabolic):    levonorgestrel (MIRENA, 52 MG,) 20 MCG/24HR IUD, 1 each by Intrauterine route once.   metFORMIN (GLUCOPHAGE) 1000 MG tablet, Take 0.5 tablets (500 mg total) by mouth 2 (two) times daily with a meal.   tirzepatide (MOUNJARO) 10 MG/0.5ML Pen, Inject 10 mg into the skin once a  week.   Current Outpatient Medications (Respiratory):    fluticasone (FLONASE) 50 MCG/ACT nasal spray, Place 2 sprays into both nostrils daily as needed for allergies.   Loratadine 10 MG CAPS, Take 10 mg by mouth daily as needed.    Current Outpatient Medications (Other):    blood glucose meter kit and supplies, Dispense based on patient and insurance preference. Blood sugar testing to be done QD, fasting. (FOR ICD-10 E10.9, E11.9).   Carboxymethylcellulose Sodium (ARTIFICIAL TEARS OP), Place 1 drop into both eyes daily as needed (dry eyes).   pantoprazole (PROTONIX) 40 MG tablet, Take 1 tablet (40 mg total) by mouth daily.   VITAMIN D PO, Take 1 tablet by mouth daily.  Medical History:  Past  Medical History:  Diagnosis Date   Diabetes mellitus without complication (HCC)    on meds   Hepatic steatosis 05/24/2021   Noted initially on abd CT 2012   PONV (postoperative nausea and vomiting)    Prediabetes    Seasonal allergies    Allergies:  Allergies  Allergen Reactions   Crestor [Rosuvastatin Calcium] Other (See Comments)    Joint pain and malaise      Surgical History:  She  has a past surgical history that includes Cesarean section (08/28/1999); Adenoidectomy (08/27/1976); Breast reduction surgery (08/27/1996); Tympanostomy tube placement (08/27/1977); and Wisdom tooth extraction (1990). Family History:  Her family history includes Breast cancer in her mother; Cancer in her mother; Diabetes in her father, maternal aunt, maternal grandmother, maternal uncle, paternal aunt, paternal grandmother, and paternal uncle; GER disease in her mother; Heart disease in her brother and paternal grandfather; Hyperlipidemia in her brother; Hypertension in her brother and father.  REVIEW OF SYSTEMS  : All other systems reviewed and negative except where noted in the History of Present Illness.  PHYSICAL EXAM: There were no vitals taken for this visit. General Appearance: Well nourished, in  no apparent distress. Head:   Normocephalic and atraumatic. Eyes:  sclerae anicteric,conjunctive pink  Respiratory: Respiratory effort normal, BS equal bilaterally without rales, rhonchi, wheezing. Cardio: RRR with no MRGs. Peripheral pulses intact.  Abdomen: Soft,  Obese ,active bowel sounds. mild tenderness in the epigastrium. Without guarding and Without rebound. No masses. Rectal: Not evaluated Musculoskeletal: Full ROM, Normal gait. Without edema. Skin:  Dry and intact without significant lesions or rashes Neuro: Alert and  oriented x4;  No focal deficits. Psych:  Cooperative. Normal mood and affect.    Doree Albee, PA-C 8:51 AM

## 2023-02-15 ENCOUNTER — Other Ambulatory Visit: Payer: Self-pay | Admitting: General Surgery

## 2023-02-15 ENCOUNTER — Other Ambulatory Visit (HOSPITAL_COMMUNITY): Payer: Self-pay

## 2023-02-15 MED ORDER — IBUPROFEN 800 MG PO TABS
ORAL_TABLET | ORAL | 0 refills | Status: DC
  Filled 2023-02-15: qty 30, 10d supply, fill #0

## 2023-02-15 MED ORDER — OXYCODONE HCL 5 MG PO TABS
ORAL_TABLET | ORAL | 0 refills | Status: DC
  Filled 2023-02-15: qty 12, 2d supply, fill #0

## 2023-02-27 ENCOUNTER — Ambulatory Visit: Payer: Managed Care, Other (non HMO) | Admitting: Physician Assistant

## 2023-03-02 ENCOUNTER — Emergency Department (HOSPITAL_BASED_OUTPATIENT_CLINIC_OR_DEPARTMENT_OTHER)
Admission: EM | Admit: 2023-03-02 | Discharge: 2023-03-02 | Disposition: A | Discharge status: Home / Self Care | Payer: Managed Care, Other (non HMO) | Attending: Emergency Medicine | Admitting: Emergency Medicine

## 2023-03-02 ENCOUNTER — Encounter (HOSPITAL_BASED_OUTPATIENT_CLINIC_OR_DEPARTMENT_OTHER): Payer: Self-pay

## 2023-03-02 ENCOUNTER — Emergency Department (HOSPITAL_BASED_OUTPATIENT_CLINIC_OR_DEPARTMENT_OTHER): Payer: Managed Care, Other (non HMO)

## 2023-03-02 DIAGNOSIS — R1011 Right upper quadrant pain: Secondary | ICD-10-CM | POA: Diagnosis present

## 2023-03-02 LAB — URINALYSIS, ROUTINE W REFLEX MICROSCOPIC
Bilirubin Urine: NEGATIVE
Glucose, UA: NEGATIVE mg/dL
Hgb urine dipstick: NEGATIVE
Ketones, ur: NEGATIVE mg/dL
Leukocytes,Ua: NEGATIVE
Nitrite: NEGATIVE
Specific Gravity, Urine: 1.033 — ABNORMAL HIGH (ref 1.005–1.030)
pH: 5.5 (ref 5.0–8.0)

## 2023-03-02 LAB — CBC
HCT: 43.3 % (ref 36.0–46.0)
Hemoglobin: 15.1 g/dL — ABNORMAL HIGH (ref 12.0–15.0)
MCH: 30.3 pg (ref 26.0–34.0)
MCHC: 34.9 g/dL (ref 30.0–36.0)
MCV: 86.8 fL (ref 80.0–100.0)
Platelets: 297 10*3/uL (ref 150–400)
RBC: 4.99 MIL/uL (ref 3.87–5.11)
RDW: 12.2 % (ref 11.5–15.5)
WBC: 6.7 10*3/uL (ref 4.0–10.5)
nRBC: 0 % (ref 0.0–0.2)

## 2023-03-02 LAB — COMPREHENSIVE METABOLIC PANEL
ALT: 25 U/L (ref 0–44)
AST: 18 U/L (ref 15–41)
Albumin: 4.6 g/dL (ref 3.5–5.0)
Alkaline Phosphatase: 67 U/L (ref 38–126)
Anion gap: 7 (ref 5–15)
BUN: 25 mg/dL — ABNORMAL HIGH (ref 6–20)
CO2: 26 mmol/L (ref 22–32)
Calcium: 10.2 mg/dL (ref 8.9–10.3)
Chloride: 104 mmol/L (ref 98–111)
Creatinine, Ser: 0.64 mg/dL (ref 0.44–1.00)
GFR, Estimated: >60 mL/min (ref >60)
Glucose, Bld: 128 mg/dL — ABNORMAL HIGH (ref 70–99)
Potassium: 4 mmol/L (ref 3.5–5.1)
Sodium: 137 mmol/L (ref 135–145)
Total Bilirubin: 0.4 mg/dL (ref 0.3–1.2)
Total Protein: 7.3 g/dL (ref 6.5–8.1)

## 2023-03-02 LAB — PREGNANCY, URINE: Preg Test, Ur: NEGATIVE

## 2023-03-02 LAB — LIPASE, BLOOD: Lipase: 30 U/L (ref 11–51)

## 2023-03-02 MED ORDER — CYCLOBENZAPRINE HCL 10 MG PO TABS: 10.0000 mg | ORAL_TABLET | Freq: Two times a day (BID) | ORAL | 0 refills | Status: DC | PRN

## 2023-03-02 MED ORDER — IOHEXOL 300 MG/ML  SOLN
100.0000 mL | Freq: Once | INTRAMUSCULAR | Status: AC | PRN
  Administered 2023-03-02: 80 mL via INTRAVENOUS

## 2023-03-02 NOTE — ED Notes (Signed)
Pt discharged to home, discharge instructions reviewed, NAD noted

## 2023-03-02 NOTE — ED Triage Notes (Signed)
She tells me she "had my gallbladder out two weeks ago". Here today with incisional area pain "whenever I change positions--it doesn't hurt when I am lying still". She denies fever/n/v/d and is in no distress.

## 2023-03-02 NOTE — ED Provider Notes (Signed)
[place] EMERGENCY DEPARTMENT AT [facility] Provider Note   CSN: [id number] Arrival date & time: 03/02/23  0831     History {Add pertinent medical, surgical, social history, OB history to HPI:1} Chief Complaint  Patient presents with   Abdominal Pain    Cassidy Gibson is a 52 y.o. female.  HPI     52 year old who has a history of cholecystectomy 2 weeks ago presents with concern for recurrent abdominal pain.  Reports on Wednesday she is feeling much better, with no pain at all, on Thursday she did woke up in the morning with severe stabbing, burning and pulling pain to the right upper quadrant.  Reports the pain is so severe is difficult to get out of bed.  She initially called her surgeon, then rotated ice and heat and felt that she was feeling better.  When she stays still the pain is not as severe, however when she gets up to move it becomes severe.  She denies any associated nausea, vomiting, diarrhea, constipation, change in appetite, fever, chills, chest pain, shortness of breath.  She discussed with her surgeon this morning who recommended she come to the emergency department due to worsening pain.  Home Medications Prior to Admission medications   Medication Sig Start Date End Date Taking? Authorizing Provider  blood glucose meter kit and supplies Dispense based on patient and insurance preference. Blood sugar testing to be done QD, fasting. (FOR ICD-10 E10.9, E11.9). 06/18/22   Carlean Jews, NP  Carboxymethylcellulose Sodium (ARTIFICIAL TEARS OP) Place 1 drop into both eyes daily as needed (dry eyes).    [provider]  fluticasone (FLONASE) 50 MCG/ACT nasal spray Place 2 sprays into both nostrils daily as needed for allergies.    [provider]  ibuprofen (ADVIL) 800 MG tablet Take 1 tablet (800 mg total) by mouth every 8 (eight) hours as needed for Pain for up to 10 days 02/15/23     levonorgestrel (MIRENA, 52 MG,) 20 MCG/24HR IUD 1 each by  Intrauterine route once.    [provider]  Loratadine 10 MG CAPS Take 10 mg by mouth daily as needed.    [provider]  metFORMIN (GLUCOPHAGE) 1000 MG tablet Take 0.5 tablets (500 mg total) by mouth 2 (two) times daily with a meal. 09/12/22   Carlean Jews, NP  oxyCODONE (OXY IR/ROXICODONE) 5 MG immediate release tablet Take 1 tablet (5 mg total) by mouth every 4 (four) hours as needed for Pain 02/15/23     pantoprazole (PROTONIX) 40 MG tablet Take 1 tablet (40 mg total) by mouth daily. 11/26/22   Doree Albee, PA-C  tirzepatide [facility]) 10 MG/0.5ML Pen Inject 10 mg into the skin once a week. 12/20/22   Carlean Jews, NP  VITAMIN D PO Take 1 tablet by mouth daily.    [provider]      Allergies    Crestor [rosuvastatin calcium]    Review of Systems   Review of Systems  Physical Exam Updated Vital Signs BP 132/88 (BP Location: Right Arm)   Pulse 78   Temp 98 F (36.7 C) (Oral)   Resp 18   Ht 5' 2.5" (1.588 m)   Wt 74.4 kg   LMP  (LMP Unknown)   SpO2 97%   BMI 29.52 kg/m  Physical Exam  ED Results / Procedures / Treatments   Labs (all labs ordered are listed, but only abnormal results are displayed) Labs Reviewed  COMPREHENSIVE METABOLIC PANEL -  Abnormal; Notable for the following components:      Result Value   Glucose, Bld 128 (*)    BUN 25 (*)    All other components within normal limits  CBC - Abnormal; Notable for the following components:   Hemoglobin 15.1 (*)    All other components within normal limits  URINALYSIS, ROUTINE W REFLEX MICROSCOPIC - Abnormal; Notable for the following components:   Specific Gravity, Urine 1.033 (*)    Protein, ur TRACE (*)    All other components within normal limits  LIPASE, BLOOD  PREGNANCY, URINE    EKG None  Radiology No results found.  Procedures Procedures  {Document cardiac monitor, telemetry assessment procedure when appropriate:1}  Medications Ordered in ED Medications  - No data to display  ED Course/ Medical Decision Making/ A&P   {   Click here for ABCD2, HEART and other calculatorsREFRESH Note before signing :1}                          Medical Decision Making Amount and/or Complexity of Data Reviewed Labs: ordered. Radiology: ordered.  Risk Prescription drug management.   Labs completed and personally about interpreted by me show no transaminitis, no pancreatitis, no leukocytosis, no anemia, no clinically significant electrolyte abnormalities, no signs of UTI, and a negative pregnancy test.  CT abdomen pelvis was performed and evaluated by me and radiology shows no intra-abdominal findings, status post cystectomy no fluid collection or other abnormality in the gallbladder fossa, tiny hiatal hernia.  {Document critical care time when appropriate:1} {Document review of labs and clinical decision tools ie heart score, Chads2Vasc2 etc:1}  {Document your independent review of radiology images, and any outside records:1} {Document your discussion with family members, caretakers, and with consultants:1} {Document social determinants of health affecting pt's care:1} {Document your decision making why or why not admission, treatments were needed:1} Final Clinical Impression(s) / ED Diagnoses Final diagnoses:  None    Rx / DC Orders ED Discharge Orders     None

## 2023-04-02 ENCOUNTER — Other Ambulatory Visit: Payer: Self-pay | Admitting: Family Medicine

## 2023-04-02 DIAGNOSIS — Z0000 Encounter for general adult medical examination without abnormal findings: Secondary | ICD-10-CM

## 2023-04-02 DIAGNOSIS — E119 Type 2 diabetes mellitus without complications: Secondary | ICD-10-CM

## 2023-04-02 DIAGNOSIS — E785 Hyperlipidemia, unspecified: Secondary | ICD-10-CM

## 2023-04-02 DIAGNOSIS — R748 Abnormal levels of other serum enzymes: Secondary | ICD-10-CM

## 2023-04-10 ENCOUNTER — Other Ambulatory Visit: Payer: Managed Care, Other (non HMO)

## 2023-04-10 DIAGNOSIS — E785 Hyperlipidemia, unspecified: Secondary | ICD-10-CM

## 2023-04-10 DIAGNOSIS — Z0000 Encounter for general adult medical examination without abnormal findings: Secondary | ICD-10-CM

## 2023-04-10 DIAGNOSIS — R748 Abnormal levels of other serum enzymes: Secondary | ICD-10-CM

## 2023-04-10 DIAGNOSIS — E119 Type 2 diabetes mellitus without complications: Secondary | ICD-10-CM

## 2023-04-11 LAB — CBC
Hematocrit: 45.8 % (ref 34.0–46.6)
Hemoglobin: 14.9 g/dL (ref 11.1–15.9)
MCH: 29.4 pg (ref 26.6–33.0)
MCHC: 32.5 g/dL (ref 31.5–35.7)
MCV: 91 fL (ref 79–97)
Platelets: 299 10*3/uL (ref 150–450)
RBC: 5.06 x10E6/uL (ref 3.77–5.28)
RDW: 12.9 % (ref 11.7–15.4)
WBC: 7.3 10*3/uL (ref 3.4–10.8)

## 2023-04-11 LAB — HEMOGLOBIN A1C
Est. average glucose Bld gHb Est-mCnc: 123 mg/dL
Hgb A1c MFr Bld: 5.9 % — ABNORMAL HIGH (ref 4.8–5.6)

## 2023-04-11 LAB — LIPID PANEL
Chol/HDL Ratio: 2.8 ratio (ref 0.0–4.4)
Cholesterol, Total: 191 mg/dL (ref 100–199)
HDL: 69 mg/dL (ref >39)
LDL Chol Calc (NIH): 108 mg/dL — ABNORMAL HIGH (ref 0–99)
Triglycerides: 78 mg/dL (ref 0–149)
VLDL Cholesterol Cal: 14 mg/dL (ref 5–40)

## 2023-04-11 LAB — COMPREHENSIVE METABOLIC PANEL
ALT: 27 IU/L (ref 0–32)
AST: 21 IU/L (ref 0–40)
Albumin: 4.5 g/dL (ref 3.8–4.9)
Alkaline Phosphatase: 104 IU/L (ref 44–121)
BUN/Creatinine Ratio: 21 (ref 9–23)
BUN: 16 mg/dL (ref 6–24)
Bilirubin Total: 0.5 mg/dL (ref 0.0–1.2)
CO2: 23 mmol/L (ref 20–29)
Calcium: 9.7 mg/dL (ref 8.7–10.2)
Chloride: 102 mmol/L (ref 96–106)
Creatinine, Ser: 0.77 mg/dL (ref 0.57–1.00)
Globulin, Total: 2.8 g/dL (ref 1.5–4.5)
Glucose: 99 mg/dL (ref 70–99)
Potassium: 4.4 mmol/L (ref 3.5–5.2)
Sodium: 139 mmol/L (ref 134–144)
Total Protein: 7.3 g/dL (ref 6.0–8.5)
eGFR: 93 mL/min/{1.73_m2} (ref >59)

## 2023-04-11 LAB — TSH: TSH: 0.71 u[IU]/mL (ref 0.450–4.500)

## 2023-04-17 ENCOUNTER — Ambulatory Visit: Payer: Managed Care, Other (non HMO) | Admitting: Nurse Practitioner

## 2023-04-17 ENCOUNTER — Encounter: Payer: Self-pay | Admitting: Family Medicine

## 2023-04-17 ENCOUNTER — Ambulatory Visit (INDEPENDENT_AMBULATORY_CARE_PROVIDER_SITE_OTHER): Payer: Managed Care, Other (non HMO) | Admitting: Family Medicine

## 2023-04-17 VITALS — BP 112/78 | HR 82 | Resp 18 | Ht 62.5 in | Wt 160.0 lb

## 2023-04-17 DIAGNOSIS — Z1159 Encounter for screening for other viral diseases: Secondary | ICD-10-CM

## 2023-04-17 DIAGNOSIS — Z0000 Encounter for general adult medical examination without abnormal findings: Secondary | ICD-10-CM

## 2023-04-17 DIAGNOSIS — E1169 Type 2 diabetes mellitus with other specified complication: Secondary | ICD-10-CM

## 2023-04-17 DIAGNOSIS — E785 Hyperlipidemia, unspecified: Secondary | ICD-10-CM

## 2023-04-17 DIAGNOSIS — E119 Type 2 diabetes mellitus without complications: Secondary | ICD-10-CM

## 2023-04-17 LAB — POCT UA - MICROALBUMIN
Albumin/Creatinine Ratio, Urine, POC: <30
Creatinine, POC: 200 mg/dL
Microalbumin Ur, POC: 30 mg/L

## 2023-04-17 NOTE — Assessment & Plan Note (Signed)
A1c 5.9.  Continue Mounjaro 10 mg weekly.  Continue with low-carb diet and physical activity.  Will continue to monitor.

## 2023-04-17 NOTE — Progress Notes (Signed)
Complete physical exam  Patient: Cassidy Gibson   DOB: 1970/09/01   52 y.o. Female  MRN: [id number]  Subjective:    Chief Complaint  Patient presents with   Annual Exam    Cassidy Gibson is a 52 y.o. female who presents today for a complete physical exam. She reports consuming a  low carb  diet.  She does her best to get routine physical activity, but she notes that she could do more.  She generally feels well. She reports sleeping well. She does not have additional problems to discuss today.    Most recent fall risk assessment:    09/12/2022    8:13 AM  Fall Risk   Falls in the past year? 0  Number falls in past yr: 0  Injury with Fall? 0     Most recent depression and anxiety screenings:    12/13/2022    8:14 AM 09/12/2022    8:12 AM  PHQ 2/9 Scores  PHQ - 2 Score 0 0  PHQ- 9 Score 0 0      09/12/2022    8:12 AM 07/31/2022    2:07 PM 06/12/2022    9:17 AM 05/17/2022    3:39 PM  GAD 7 : Generalized Anxiety Score  Nervous, Anxious, on Edge 0 0 0 0  Control/stop worrying 0 0 0 0  Worry too much - different things 0 0 0 0  Trouble relaxing 0 0 0 0  Restless 0 0 0 0  Easily annoyed or irritable 0 0 0 0  Afraid - awful might happen 0 0 0 0  Total GAD 7 Score 0 0 0 0  Anxiety Difficulty Not difficult at all Not difficult at all      Patient Active Problem List   Diagnosis Date Noted   Statin intolerance 12/13/2022   Localized superficial mass 08/18/2022   Hyperlipidemia associated with type 2 diabetes mellitus (HCC) 02/11/2022   Hepatic steatosis 05/24/2021   IUD (intrauterine device) in place 05/23/2021   Seasonal allergic rhinitis due to pollen 05/23/2021   BMI 31.0-31.9,adult 05/23/2021   Type 2 diabetes mellitus without complication, without long-term current use of insulin (HCC) 01/29/2020    Past Surgical History:  Procedure Laterality Date   ADENOIDECTOMY  08/27/1976   BREAST REDUCTION SURGERY  08/27/1996   CESAREAN SECTION  08/28/1999    CHOLECYSTECTOMY, LAPAROSCOPIC  01/2023   TYMPANOSTOMY TUBE PLACEMENT  08/27/1977   WISDOM TOOTH EXTRACTION  1990   Social History   Tobacco Use   Smoking status: Never    Passive exposure: Never   Smokeless tobacco: Never  Vaping Use   Vaping status: Never Used  Substance Use Topics   Alcohol use: No    Alcohol/week: 0.0 standard drinks of alcohol   Drug use: No   Family History  Problem Relation Age of Onset   Breast cancer Mother    GER disease Mother    Cancer Mother        Breast   Diabetes Father    Hypertension Father    Hyperlipidemia Brother    Hypertension Brother    Heart disease Brother    Diabetes Maternal Aunt    Diabetes Maternal Uncle    Diabetes Paternal Aunt    Diabetes Paternal Uncle    Diabetes Maternal Grandmother    Diabetes Paternal Grandmother    Heart disease Paternal Grandfather    Colon polyps Neg Hx    Colon cancer Neg Hx  Esophageal cancer Neg Hx    Rectal cancer Neg Hx    Stomach cancer Neg Hx    Allergies  Allergen Reactions   Crestor [Rosuvastatin Calcium] Other (See Comments)    Joint pain and malaise      Patient Care Team: Melida Quitter, PA as PCP - General (Family Medicine) Himmelrich, Loree Fee, RD (Inactive) as Dietitian Zelphia Cairo, MD as Consulting Physician (Obstetrics and Gynecology)   Outpatient Medications Prior to Visit  Medication Sig   blood glucose meter kit and supplies Dispense based on patient and insurance preference. Blood sugar testing to be done QD, fasting. (FOR ICD-10 E10.9, E11.9).   Carboxymethylcellulose Sodium (ARTIFICIAL TEARS OP) Place 1 drop into both eyes daily as needed (dry eyes).   fluticasone (FLONASE) 50 MCG/ACT nasal spray Place 2 sprays into both nostrils daily as needed for allergies.   ibuprofen (ADVIL) 800 MG tablet Take 1 tablet (800 mg total) by mouth every 8 (eight) hours as needed for Pain for up to 10 days   levonorgestrel (MIRENA, 52 MG,) 20 MCG/24HR IUD 1 each by  Intrauterine route once.   Loratadine 10 MG CAPS Take 10 mg by mouth daily as needed.   tirzepatide (MOUNJARO) 10 MG/0.5ML Pen Inject 10 mg into the skin once a week.   VITAMIN D PO Take 1 tablet by mouth daily.   [DISCONTINUED] cyclobenzaprine (FLEXERIL) 10 MG tablet Take 1 tablet (10 mg total) by mouth 2 (two) times daily as needed for muscle spasms.   [DISCONTINUED] metFORMIN (GLUCOPHAGE) 1000 MG tablet Take 0.5 tablets (500 mg total) by mouth 2 (two) times daily with a meal.   [DISCONTINUED] oxyCODONE (OXY IR/ROXICODONE) 5 MG immediate release tablet Take 1 tablet (5 mg total) by mouth every 4 (four) hours as needed for Pain   [DISCONTINUED] pantoprazole (PROTONIX) 40 MG tablet Take 1 tablet (40 mg total) by mouth daily.   No facility-administered medications prior to visit.    Review of Systems  Constitutional:  Negative for chills, fever and malaise/fatigue.  HENT:  Negative for congestion and hearing loss.   Eyes:  Negative for blurred vision and double vision.  Respiratory:  Negative for cough and shortness of breath.   Cardiovascular:  Negative for chest pain, palpitations and leg swelling.  Gastrointestinal:  Negative for abdominal pain, constipation, diarrhea and heartburn.  Genitourinary:  Negative for frequency and urgency.  Musculoskeletal:  Negative for myalgias and neck pain.  Neurological:  Negative for headaches.  Endo/Heme/Allergies:  Negative for polydipsia.  Psychiatric/Behavioral:  Negative for depression. The patient is not nervous/anxious and does not have insomnia.       Objective:    BP 112/78 (BP Location: Left Arm, Patient Position: Sitting, Cuff Size: Normal)   Pulse 82   Resp 18   Ht 5' 2.5" (1.588 m)   Wt 160 lb (72.6 kg)   SpO2 98%   BMI 28.80 kg/m    Physical Exam Constitutional:      General: She is not in acute distress.    Appearance: Normal appearance.  HENT:     Head: Normocephalic and atraumatic.     Right Ear: Tympanic membrane, ear  canal and external ear normal.     Left Ear: Tympanic membrane, ear canal and external ear normal.     Nose: Nose normal.     Mouth/Throat:     Mouth: Mucous membranes are moist.     Pharynx: No oropharyngeal exudate or posterior oropharyngeal erythema.  Eyes:  Extraocular Movements: Extraocular movements intact.     Conjunctiva/sclera: Conjunctivae normal.     Pupils: Pupils are equal, round, and reactive to light.     Comments: Wears glasses, prescription up-to-date  Neck:     Thyroid: No thyroid mass, thyromegaly or thyroid tenderness.  Cardiovascular:     Rate and Rhythm: Normal rate and regular rhythm.     Heart sounds: Normal heart sounds. No murmur heard.    No friction rub. No gallop.  Pulmonary:     Effort: Pulmonary effort is normal. No respiratory distress.     Breath sounds: Normal breath sounds. No wheezing, rhonchi or rales.  Abdominal:     General: Abdomen is flat. Bowel sounds are normal. There is no distension.     Palpations: There is no mass.     Tenderness: There is no abdominal tenderness. There is no guarding.  Musculoskeletal:        General: Normal range of motion.     Cervical back: Normal range of motion and neck supple.  Lymphadenopathy:     Cervical: No cervical adenopathy.  Skin:    General: Skin is warm and dry.  Neurological:     Mental Status: She is alert and oriented to person, place, and time.     Cranial Nerves: No cranial nerve deficit.     Motor: No weakness.     Deep Tendon Reflexes: Reflexes normal.  Psychiatric:        Mood and Affect: Mood normal.    Diabetic Foot Exam - Simple   Simple Foot Form Diabetic Foot exam was performed with the following findings: Yes 04/17/2023 12:19 PM  Visual Inspection No deformities, no ulcerations, no other skin breakdown bilaterally: Yes Sensation Testing Intact to touch and monofilament testing bilaterally: Yes Pulse Check Posterior Tibialis and Dorsalis pulse intact bilaterally:  Yes Comments    Results for orders placed or performed in visit on 04/17/23  POCT UA - Microalbumin  Result Value Ref Range   Microalbumin Ur, POC 30 mg/L   Creatinine, POC 200 mg/dL   Albumin/Creatinine Ratio, Urine, POC <30      Assessment & Plan:    Routine Health Maintenance and Physical Exam  Immunization History  Administered Date(s) Administered   Hepatitis A, Adult 12/26/2003   Hepatitis B 12/26/2003   Influenza,inj,Quad PF,6+ Mos 08/16/2015, 06/28/2020, 05/23/2021, 06/18/2022   Influenza-Unspecified 05/27/2013, 05/27/2021   MMR 11/25/1976   Moderna SARS-COV2 Booster Vaccination 11/04/2020   PFIZER(Purple Top)SARS-COV-2 Vaccination 10/21/2019, 11/19/2019   PNEUMOCOCCAL CONJUGATE-20 05/23/2021   Pneumococcal-Unspecified 02/24/2021   Td 01/26/2004   Tdap 03/28/2007, 12/26/2014, 02/06/2015   Typhoid Inactivated 01/21/2015   Varicella 11/25/1976   Yellow Fever 01/21/2015    Health Maintenance  Topic Date Due   Hepatitis C Screening  Never done   Zoster Vaccines- Shingrix (1 of 2) Never done   COVID-19 Vaccine (3 - Pfizer risk series) 12/02/2020   PAP SMEAR-Modifier  01/08/2023   OPHTHALMOLOGY EXAM  03/22/2023   INFLUENZA VACCINE  11/25/2023 (Originally 03/28/2023)   MAMMOGRAM  07/25/2023   HEMOGLOBIN A1C  10/11/2023   Diabetic kidney evaluation - eGFR measurement  04/09/2024   Diabetic kidney evaluation - Urine ACR  04/16/2024   FOOT EXAM  04/16/2024   DTaP/Tdap/Td (5 - Td or Tdap) 02/05/2025   Colonoscopy  12/14/2028   HIV Screening  Completed   HPV VACCINES  Aged Out    Reviewed most recent labs including CBC, CMP, lipid panel, A1C, TSH, and vitamin D. All  within normal limits/stable from last check other than LDL at 108 which remains above goal of <70.  She would like to make efforts in nutrition and lifestyle over the next 3 months prior to trying a different statin medication as rosuvastatin caused myalgias in the past. We discussed recommendations for  shingles vaccine, patient will consider this and is aware that she can get it at primary care office or her local pharmacy. We will request records from [facility] for her most recent Pap and mammogram after her appointment in the next couple of weeks. Will also request a copy of her eye exam which is being done next month.  Discussed health benefits of physical activity, and encouraged her to engage in regular exercise appropriate for her age and condition.  Wellness examination  Type 2 diabetes mellitus without complication, without long-term current use of insulin (HCC) Assessment & Plan: A1c 5.9.  Continue Mounjaro 10 mg weekly.  Continue with low-carb diet and physical activity.  Will continue to monitor.  Orders: -     POCT UA - Microalbumin -     CBC with Differential/Platelet; Future -     Comprehensive metabolic panel; Future -     Hemoglobin A1c; Future  Hyperlipidemia associated with type 2 diabetes mellitus (HCC) Assessment & Plan: Last lipid panel: LDL 108, HDL 69, triglycerides 78.  Goal LDL <70.  We discussed this, and patient would like to commit to nutritional and physical activity changes in the next 3 months prior to initiating trial of a different statin.  Rosuvastatin caused significant side effects in the past.  Will recheck lipid panel prior to 66-month follow-up and adjust management as indicated by those results.  Orders: -     CBC with Differential/Platelet; Future -     Comprehensive metabolic panel; Future -     Lipid panel; Future  Screening for viral disease -     Hepatitis C antibody; Future    Return in about 4 months (around 08/17/2023) for follow-up for HLD, DM, fasting blood work 1 week before.     Melida Quitter, PA

## 2023-04-17 NOTE — Assessment & Plan Note (Signed)
Last lipid panel: LDL 108, HDL 69, triglycerides 78.  Goal LDL <70.  We discussed this, and patient would like to commit to nutritional and physical activity changes in the next 3 months prior to initiating trial of a different statin.  Rosuvastatin caused significant side effects in the past.  Will recheck lipid panel prior to 87-month follow-up and adjust management as indicated by those results.

## 2023-04-19 ENCOUNTER — Encounter: Payer: Self-pay | Admitting: Family Medicine

## 2023-04-25 ENCOUNTER — Other Ambulatory Visit: Payer: Self-pay | Admitting: Obstetrics and Gynecology

## 2023-04-25 DIAGNOSIS — Z1239 Encounter for other screening for malignant neoplasm of breast: Secondary | ICD-10-CM

## 2023-05-17 ENCOUNTER — Other Ambulatory Visit: Payer: Self-pay

## 2023-05-17 DIAGNOSIS — E119 Type 2 diabetes mellitus without complications: Secondary | ICD-10-CM

## 2023-05-17 MED ORDER — TIRZEPATIDE 10 MG/0.5ML ~~LOC~~ SOAJ
10.0000 mg | SUBCUTANEOUS | 1 refills | Status: DC
Start: 2023-05-17 — End: 2023-09-23

## 2023-05-22 LAB — HM DIABETES EYE EXAM

## 2023-07-25 ENCOUNTER — Other Ambulatory Visit: Payer: Self-pay | Admitting: Nurse Practitioner

## 2023-08-09 ENCOUNTER — Other Ambulatory Visit: Payer: Managed Care, Other (non HMO)

## 2023-08-09 DIAGNOSIS — E119 Type 2 diabetes mellitus without complications: Secondary | ICD-10-CM

## 2023-08-09 DIAGNOSIS — Z1159 Encounter for screening for other viral diseases: Secondary | ICD-10-CM

## 2023-08-09 DIAGNOSIS — E1169 Type 2 diabetes mellitus with other specified complication: Secondary | ICD-10-CM

## 2023-08-10 LAB — HEMOGLOBIN A1C
Est. average glucose Bld gHb Est-mCnc: 120 mg/dL
Hgb A1c MFr Bld: 5.8 % — ABNORMAL HIGH (ref 4.8–5.6)

## 2023-08-10 LAB — LIPID PANEL
Chol/HDL Ratio: 2.7 {ratio} (ref 0.0–4.4)
Cholesterol, Total: 194 mg/dL (ref 100–199)
HDL: 72 mg/dL (ref >39)
LDL Chol Calc (NIH): 110 mg/dL — ABNORMAL HIGH (ref 0–99)
Triglycerides: 67 mg/dL (ref 0–149)
VLDL Cholesterol Cal: 12 mg/dL (ref 5–40)

## 2023-08-10 LAB — CBC WITH DIFFERENTIAL/PLATELET
Basophils Absolute: 0.1 10*3/uL (ref 0.0–0.2)
Basos: 1 %
EOS (ABSOLUTE): 0.3 10*3/uL (ref 0.0–0.4)
Eos: 4 %
Hematocrit: 46.6 % (ref 34.0–46.6)
Hemoglobin: 15.3 g/dL (ref 11.1–15.9)
Immature Grans (Abs): 0.1 10*3/uL (ref 0.0–0.1)
Immature Granulocytes: 1 %
Lymphocytes Absolute: 2.5 10*3/uL (ref 0.7–3.1)
Lymphs: 41 %
MCH: 29.7 pg (ref 26.6–33.0)
MCHC: 32.8 g/dL (ref 31.5–35.7)
MCV: 91 fL (ref 79–97)
Monocytes Absolute: 0.5 10*3/uL (ref 0.1–0.9)
Monocytes: 9 %
Neutrophils Absolute: 2.6 10*3/uL (ref 1.4–7.0)
Neutrophils: 44 %
Platelets: 266 10*3/uL (ref 150–450)
RBC: 5.15 x10E6/uL (ref 3.77–5.28)
RDW: 12.4 % (ref 11.7–15.4)
WBC: 6 10*3/uL (ref 3.4–10.8)

## 2023-08-10 LAB — COMPREHENSIVE METABOLIC PANEL
ALT: 21 [IU]/L (ref 0–32)
AST: 30 [IU]/L (ref 0–40)
Albumin: 4.7 g/dL (ref 3.8–4.9)
Alkaline Phosphatase: 107 [IU]/L (ref 44–121)
BUN/Creatinine Ratio: 17 (ref 9–23)
BUN: 12 mg/dL (ref 6–24)
Bilirubin Total: 0.5 mg/dL (ref 0.0–1.2)
CO2: 21 mmol/L (ref 20–29)
Calcium: 9.6 mg/dL (ref 8.7–10.2)
Chloride: 103 mmol/L (ref 96–106)
Creatinine, Ser: 0.71 mg/dL (ref 0.57–1.00)
Globulin, Total: 2.4 g/dL (ref 1.5–4.5)
Glucose: 87 mg/dL (ref 70–99)
Potassium: 4.8 mmol/L (ref 3.5–5.2)
Sodium: 143 mmol/L (ref 134–144)
Total Protein: 7.1 g/dL (ref 6.0–8.5)
eGFR: 103 mL/min/{1.73_m2} (ref >59)

## 2023-08-10 LAB — HEPATITIS C ANTIBODY: Hep C Virus Ab: NONREACTIVE

## 2023-08-14 ENCOUNTER — Encounter: Payer: Self-pay | Admitting: Family Medicine

## 2023-08-14 NOTE — Telephone Encounter (Signed)
 Care team updated and letter sent for eye exam notes.

## 2023-08-16 ENCOUNTER — Ambulatory Visit: Payer: Managed Care, Other (non HMO) | Admitting: Family Medicine

## 2023-09-16 ENCOUNTER — Ambulatory Visit: Payer: Managed Care, Other (non HMO) | Admitting: Family Medicine

## 2023-09-23 ENCOUNTER — Ambulatory Visit (INDEPENDENT_AMBULATORY_CARE_PROVIDER_SITE_OTHER): Payer: Managed Care, Other (non HMO) | Admitting: Family Medicine

## 2023-09-23 VITALS — BP 121/79 | HR 76 | Temp 98.0°F | Ht 62.5 in | Wt 152.0 lb

## 2023-09-23 DIAGNOSIS — E785 Hyperlipidemia, unspecified: Secondary | ICD-10-CM | POA: Diagnosis not present

## 2023-09-23 DIAGNOSIS — E1169 Type 2 diabetes mellitus with other specified complication: Secondary | ICD-10-CM | POA: Diagnosis not present

## 2023-09-23 DIAGNOSIS — E119 Type 2 diabetes mellitus without complications: Secondary | ICD-10-CM

## 2023-09-23 DIAGNOSIS — Z7985 Long-term (current) use of injectable non-insulin antidiabetic drugs: Secondary | ICD-10-CM

## 2023-09-23 MED ORDER — TIRZEPATIDE 10 MG/0.5ML ~~LOC~~ SOAJ: 10.0000 mg | SUBCUTANEOUS | 1 refills | Status: DC

## 2023-09-23 NOTE — Progress Notes (Signed)
Established Patient Office Visit  Subjective   Patient ID: Cassidy Gibson, female    DOB: [birth year]  Age: 53 y.o. MRN: [id number]  Chief Complaint  Patient presents with   Follow-up    HPI Cassidy Gibson is a 53 y.o. female presenting today for follow up of hyperlipidemia, diabetes. Hyperlipidemia: Currently managing with lifestyle interventions. The 10-year ASCVD risk score (Arnett DK, et al., 2019) is: 1.6% Diabetes: denies hypoglycemic events, wounds or sores that are not healing well, increased thirst or urination. Denies vision problems, eye exam up-to-date.  Taking Mounjaro as prescribed without any side effects.    Outpatient Medications Prior to Visit  Medication Sig   Accu-Chek Softclix Lancets lancets USE FOR BLOOD SUGAR TESTING 2 TO 3 TIMES DAILY   blood glucose meter kit and supplies Dispense based on patient and insurance preference. Blood sugar testing to be done QD, fasting. (FOR ICD-10 E10.9, E11.9).   Carboxymethylcellulose Sodium (ARTIFICIAL TEARS OP) Place 1 drop into both eyes daily as needed (dry eyes).   fluticasone (FLONASE) 50 MCG/ACT nasal spray Place 2 sprays into both nostrils daily as needed for allergies.   levonorgestrel (MIRENA, 52 MG,) 20 MCG/24HR IUD 1 each by Intrauterine route once.   Loratadine 10 MG CAPS Take 10 mg by mouth daily as needed.   VITAMIN D PO Take 1 tablet by mouth daily.   [DISCONTINUED] tirzepatide (MOUNJARO) 10 MG/0.5ML Pen Inject 10 mg into the skin once a week.   [DISCONTINUED] ibuprofen (ADVIL) 800 MG tablet Take 1 tablet (800 mg total) by mouth every 8 (eight) hours as needed for Pain for up to 10 days   No facility-administered medications prior to visit.    ROS Negative unless otherwise noted in HPI   Objective:     BP 121/79   Pulse 76   Temp 98 F (36.7 C) (Temporal)   Ht 5' 2.5" (1.588 m)   Wt 152 lb (68.9 kg)   SpO2 99%   BMI 27.36 kg/m   Physical Exam Constitutional:      General: She is not in  acute distress.    Appearance: Normal appearance.  HENT:     Head: Normocephalic and atraumatic.  Cardiovascular:     Rate and Rhythm: Normal rate and regular rhythm.     Heart sounds: No murmur heard.    No friction rub. No gallop.  Pulmonary:     Effort: Pulmonary effort is normal. No respiratory distress.     Breath sounds: No wheezing, rhonchi or rales.  Skin:    General: Skin is warm and dry.  Neurological:     Mental Status: She is alert and oriented to person, place, and time.      Assessment & Plan:  Hyperlipidemia associated with type 2 diabetes mellitus (HCC) Assessment & Plan: Last lipid panel: LDL 110, HDL 72, triglycerides 67.  Goal LDL <70.  We discussed this, and patient would like to commit to nutritional and physical activity changes in the next 4 months prior to initiating trial of a different statin.  Recommended continuing with increased physical activity, increasing fiber with goal of 25 g daily, and possible trial of red yeast rice supplement.  Rosuvastatin caused significant side effects in the past.  If lipid levels remain elevated, consider trying a statin medicine other than rosuvastatin or trying a nonstatin option.   Type 2 diabetes mellitus without complication, without long-term current use of insulin (HCC) Assessment & Plan: A1c is fantastic at 5.9.  Continue Mounjaro 10 mg weekly.  Continue with low-carb diet and physical activity.  Will continue to monitor.  Orders: -     Tirzepatide; Inject 10 mg into the skin once a week.  Dispense: 6 mL; Refill: 1    Return in about 4 months (around 01/21/2024) for follow-up for HLD, DM, fasting labs 1 week before.    Melida Quitter, PA

## 2023-09-23 NOTE — Assessment & Plan Note (Signed)
A1c is fantastic at 5.9.  Continue Mounjaro 10 mg weekly.  Continue with low-carb diet and physical activity.  Will continue to monitor.

## 2023-09-23 NOTE — Patient Instructions (Signed)
CHOLESTEROL: -25 g fiber each day -red yeast rice supplement  SLEEP: -magnesium glycinate supplement Magnesium glycinate supplement: https://a.co/[email or web address]

## 2023-09-23 NOTE — Assessment & Plan Note (Addendum)
Last lipid panel: LDL 110, HDL 72, triglycerides 67.  Goal LDL <70.  We discussed this, and patient would like to commit to nutritional and physical activity changes in the next 4 months prior to initiating trial of a different statin.  Recommended continuing with increased physical activity, increasing fiber with goal of 25 g daily, and possible trial of red yeast rice supplement.  Rosuvastatin caused significant side effects in the past.  If lipid levels remain elevated, consider trying a statin medicine other than rosuvastatin or trying a nonstatin option.

## 2023-10-03 ENCOUNTER — Encounter: Payer: Self-pay | Admitting: Family Medicine

## 2023-10-11 ENCOUNTER — Other Ambulatory Visit (HOSPITAL_COMMUNITY): Payer: Self-pay

## 2023-10-11 MED ORDER — AMOXICILLIN 875 MG PO TABS
875.0000 mg | ORAL_TABLET | Freq: Two times a day (BID) | ORAL | 0 refills | Status: AC
Start: 2023-10-11 — End: ?
  Filled 2023-10-11: qty 20, 10d supply, fill #0

## 2023-10-11 MED ORDER — LIDOCAINE VISCOUS HCL 2 % MT SOLN
10.0000 mL | Freq: Four times a day (QID) | OROMUCOSAL | 0 refills | Status: AC
  Filled 2023-10-11: qty 280, 7d supply, fill #0

## 2023-10-11 MED ORDER — PREDNISONE 20 MG PO TABS
40.0000 mg | ORAL_TABLET | Freq: Every day | ORAL | 0 refills | Status: AC
  Filled 2023-10-11: qty 10, 5d supply, fill #0

## 2023-10-22 ENCOUNTER — Other Ambulatory Visit: Payer: Managed Care, Other (non HMO)

## 2023-11-26 ENCOUNTER — Inpatient Hospital Stay: Admission: RE | Admit: 2023-11-26 | Payer: Managed Care, Other (non HMO) | Source: Ambulatory Visit

## 2023-12-31 ENCOUNTER — Ambulatory Visit
Admission: RE | Admit: 2023-12-31 | Discharge: 2023-12-31 | Disposition: A | Discharge status: Home / Self Care | Source: Ambulatory Visit | Attending: Obstetrics and Gynecology | Admitting: Obstetrics and Gynecology

## 2023-12-31 DIAGNOSIS — Z1239 Encounter for other screening for malignant neoplasm of breast: Secondary | ICD-10-CM

## 2023-12-31 MED ORDER — GADOPICLENOL 0.5 MMOL/ML IV SOLN
7.0000 mL | Freq: Once | INTRAVENOUS | Status: AC | PRN
  Administered 2023-12-31: 7 mL via INTRAVENOUS

## 2024-02-04 ENCOUNTER — Other Ambulatory Visit: Payer: Managed Care, Other (non HMO)

## 2024-02-11 ENCOUNTER — Other Ambulatory Visit: Payer: Self-pay | Admitting: *Deleted

## 2024-02-11 ENCOUNTER — Ambulatory Visit: Payer: Managed Care, Other (non HMO) | Admitting: Family Medicine

## 2024-02-11 DIAGNOSIS — E1169 Type 2 diabetes mellitus with other specified complication: Secondary | ICD-10-CM

## 2024-02-11 DIAGNOSIS — E119 Type 2 diabetes mellitus without complications: Secondary | ICD-10-CM

## 2024-02-14 ENCOUNTER — Other Ambulatory Visit

## 2024-02-17 ENCOUNTER — Other Ambulatory Visit

## 2024-02-18 ENCOUNTER — Other Ambulatory Visit

## 2024-02-19 LAB — CBC WITH DIFFERENTIAL/PLATELET
Basophils Absolute: 0.1 10*3/uL (ref 0.0–0.2)
Basos: 1 %
EOS (ABSOLUTE): 0.4 10*3/uL (ref 0.0–0.4)
Eos: 6 %
Hematocrit: 45.1 % (ref 34.0–46.6)
Hemoglobin: 15 g/dL (ref 11.1–15.9)
Immature Grans (Abs): 0 10*3/uL (ref 0.0–0.1)
Immature Granulocytes: 0 %
Lymphocytes Absolute: 3 10*3/uL (ref 0.7–3.1)
Lymphs: 46 %
MCH: 30.2 pg (ref 26.6–33.0)
MCHC: 33.3 g/dL (ref 31.5–35.7)
MCV: 91 fL (ref 79–97)
Monocytes Absolute: 0.5 10*3/uL (ref 0.1–0.9)
Monocytes: 8 %
Neutrophils Absolute: 2.5 10*3/uL (ref 1.4–7.0)
Neutrophils: 39 %
Platelets: 269 10*3/uL (ref 150–450)
RBC: 4.96 x10E6/uL (ref 3.77–5.28)
RDW: 12.8 % (ref 11.7–15.4)
WBC: 6.4 10*3/uL (ref 3.4–10.8)

## 2024-02-19 LAB — COMPREHENSIVE METABOLIC PANEL WITH GFR
ALT: 24 IU/L (ref 0–32)
AST: 22 IU/L (ref 0–40)
Albumin: 4.6 g/dL (ref 3.8–4.9)
Alkaline Phosphatase: 90 IU/L (ref 44–121)
BUN/Creatinine Ratio: 25 — ABNORMAL HIGH (ref 9–23)
BUN: 17 mg/dL (ref 6–24)
Bilirubin Total: 0.4 mg/dL (ref 0.0–1.2)
CO2: 23 mmol/L (ref 20–29)
Calcium: 9.5 mg/dL (ref 8.7–10.2)
Chloride: 100 mmol/L (ref 96–106)
Creatinine, Ser: 0.69 mg/dL (ref 0.57–1.00)
Globulin, Total: 2.4 g/dL (ref 1.5–4.5)
Glucose: 97 mg/dL (ref 70–99)
Potassium: 4.6 mmol/L (ref 3.5–5.2)
Sodium: 141 mmol/L (ref 134–144)
Total Protein: 7 g/dL (ref 6.0–8.5)
eGFR: 104 mL/min/{1.73_m2} (ref >59)

## 2024-02-19 LAB — LIPID PANEL
Chol/HDL Ratio: 2.7 ratio (ref 0.0–4.4)
Cholesterol, Total: 190 mg/dL (ref 100–199)
HDL: 71 mg/dL (ref >39)
LDL Chol Calc (NIH): 104 mg/dL — ABNORMAL HIGH (ref 0–99)
Triglycerides: 80 mg/dL (ref 0–149)
VLDL Cholesterol Cal: 15 mg/dL (ref 5–40)

## 2024-02-19 LAB — MICROALBUMIN / CREATININE URINE RATIO
Creatinine, Urine: 126.2 mg/dL
Microalb/Creat Ratio: 6 mg/g{creat} (ref 0–29)
Microalbumin, Urine: 7.1 ug/mL

## 2024-02-19 LAB — TSH: TSH: 0.819 u[IU]/mL (ref 0.450–4.500)

## 2024-02-19 LAB — HEMOGLOBIN A1C
Est. average glucose Bld gHb Est-mCnc: 120 mg/dL
Hgb A1c MFr Bld: 5.8 % — ABNORMAL HIGH (ref 4.8–5.6)

## 2024-02-20 ENCOUNTER — Ambulatory Visit

## 2024-02-20 ENCOUNTER — Ambulatory Visit: Payer: Self-pay

## 2024-02-20 VITALS — BP 123/83 | HR 71 | Temp 97.7°F | Ht 62.5 in | Wt 165.0 lb

## 2024-02-20 DIAGNOSIS — E1169 Type 2 diabetes mellitus with other specified complication: Secondary | ICD-10-CM

## 2024-02-20 DIAGNOSIS — E119 Type 2 diabetes mellitus without complications: Secondary | ICD-10-CM

## 2024-02-20 DIAGNOSIS — Z7985 Long-term (current) use of injectable non-insulin antidiabetic drugs: Secondary | ICD-10-CM

## 2024-02-20 DIAGNOSIS — Z23 Encounter for immunization: Secondary | ICD-10-CM

## 2024-02-20 DIAGNOSIS — E785 Hyperlipidemia, unspecified: Secondary | ICD-10-CM | POA: Diagnosis not present

## 2024-02-20 NOTE — Patient Instructions (Addendum)
 It was nice to see you today!  As we discussed in clinic:  -Continue with your Mounjaro  injections once weekly for diabetes. Your A1c looks great!! -We can continue with diet and exercise in combination with the red yeast rice and fish oil supplement for cholesterol control since the cholesterol has improved since the last time we checked it! If it elevates in the future, we can discuss Zetia, which is a non-statin medication that is used to lower the cholesterol.   We will plan to follow up again in 6 months for your physical, with labs the week before! It was so nice to meet you!  If you have any problems before your next visit feel free to message me via MyChart (minor issues or questions) or call the office, otherwise you may reach out to schedule an office visit.  Thank you! Saddie Sacks, PA-C

## 2024-02-20 NOTE — Progress Notes (Signed)
 Established Patient Office Visit  Subjective   Patient ID: Cassidy Gibson, female    DOB: 26-Jun-1971  Age: 53 y.o. MRN: [id number]  Chief Complaint  Patient presents with   Medical Management of Chronic Issues    HPI  Cassidy Gibson is a 53 y.o. y/o female who presents to the clinic today for follow up on HLD, DM.   HLD: It was discussed at her visit back in January that she wanted to commit to nutritional and physical activity changes in the next 4 months prior to initiating a trial of a different stain medication. Rosuvastatin  caused significant side effects/myalgias in the past. She reports that she has been  The 10-year ASCVD risk score (Arnett DK, et al., 2019) is: 1.9%  DM: Patient currently takes Mounjaro  weekly for management of their diabetes. Reports that they are taking this medication as prescribed. Denies side effects. Denies episodes of hypoglycemia. Denies new sores/wounds that are not healing.     ROS Per HPI.    Objective:     BP 123/83   Pulse 71   Temp 97.7 F (36.5 C) (Oral)   Ht 5' 2.5 (1.588 m)   Wt 165 lb 0.6 oz (74.9 kg)   SpO2 97%   BMI 29.71 kg/m    Physical Exam Constitutional:      General: She is not in acute distress.    Appearance: Normal appearance.   Cardiovascular:     Rate and Rhythm: Normal rate and regular rhythm.     Heart sounds: Normal heart sounds. No murmur heard.    No friction rub. No gallop.  Pulmonary:     Effort: Pulmonary effort is normal. No respiratory distress.     Breath sounds: Normal breath sounds.   Musculoskeletal:        General: No swelling.   Skin:    General: Skin is warm and dry.   Neurological:     General: No focal deficit present.     Mental Status: She is alert.   Psychiatric:        Mood and Affect: Mood normal.        Behavior: Behavior normal.        Thought Content: Thought content normal.    No results found for any visits on 02/20/24.  Last CBC Lab Results  Component Value  Date   WBC 6.4 02/18/2024   HGB 15.0 02/18/2024   HCT 45.1 02/18/2024   MCV 91 02/18/2024   MCH 30.2 02/18/2024   RDW 12.8 02/18/2024   PLT 269 02/18/2024   Last metabolic panel Lab Results  Component Value Date   GLUCOSE 97 02/18/2024   NA 141 02/18/2024   K 4.6 02/18/2024   CL 100 02/18/2024   CO2 23 02/18/2024   BUN 17 02/18/2024   CREATININE 0.69 02/18/2024   EGFR 104 02/18/2024   CALCIUM  9.5 02/18/2024   PROT 7.0 02/18/2024   ALBUMIN 4.6 02/18/2024   LABGLOB 2.4 02/18/2024   AGRATIO 1.8 01/30/2022   BILITOT 0.4 02/18/2024   ALKPHOS 90 02/18/2024   AST 22 02/18/2024   ALT 24 02/18/2024   ANIONGAP 7 03/02/2023   Last lipids Lab Results  Component Value Date   CHOL 190 02/18/2024   HDL 71 02/18/2024   LDLCALC 104 (H) 02/18/2024   TRIG 80 02/18/2024   CHOLHDL 2.7 02/18/2024   Last hemoglobin A1c Lab Results  Component Value Date   HGBA1C 5.8 (H) 02/18/2024   Last thyroid  functions Lab  Results  Component Value Date   TSH 0.819 02/18/2024      The 10-year ASCVD risk score (Arnett DK, et al., 2019) is: 1.9%    Assessment & Plan:   Type 2 diabetes mellitus without complication, without long-term current use of insulin  (HCC) Assessment & Plan: A1c stable and well controlled with Mounjaro  10 mg weekly. Foot exam, eye exam, and UACR UTD. Will continue to monitor.   Need for shingles vaccine  Hyperlipidemia associated with type 2 diabetes mellitus (HCC) Assessment & Plan: Last lipid panel: LDL 104, HDL 71, Trig 80. Goal LDL < 70. LDL has improved slightly with diet and exercise changes alone. Patient has previously tried rosuvastatin  and was intolerant of it due to severe myalgias. Discussed with patient that we can continue controlling with lifestyle modifications given that A1c is so well controlled. Discussed that we can try Zetia in the future if lipids remain elevated above goal.     Return in about 6 months (around 08/21/2024) for Physical, DM,  HLD.    Saddie JULIANNA Sacks, PA-C

## 2024-02-20 NOTE — Assessment & Plan Note (Signed)
 A1c stable and well controlled with Mounjaro  10 mg weekly. Foot exam, eye exam, and UACR UTD. Will continue to monitor.

## 2024-02-20 NOTE — Assessment & Plan Note (Signed)
 Last lipid panel: LDL 104, HDL 71, Trig 80. Goal LDL < 70. LDL has improved slightly with diet and exercise changes alone. Patient has previously tried rosuvastatin  and was intolerant of it due to severe myalgias. Discussed with patient that we can continue controlling with lifestyle modifications given that A1c is so well controlled. Discussed that we can try Zetia in the future if lipids remain elevated above goal.

## 2024-03-23 DIAGNOSIS — E119 Type 2 diabetes mellitus without complications: Secondary | ICD-10-CM

## 2024-03-23 MED ORDER — TIRZEPATIDE 12.5 MG/0.5ML ~~LOC~~ SOAJ
12.5000 mg | SUBCUTANEOUS | 2 refills | Status: AC
Start: 2024-03-23 — End: ?

## 2024-05-21 ENCOUNTER — Other Ambulatory Visit: Payer: Self-pay | Admitting: Obstetrics and Gynecology

## 2024-05-21 DIAGNOSIS — Z9189 Other specified personal risk factors, not elsewhere classified: Secondary | ICD-10-CM

## 2024-08-17 ENCOUNTER — Other Ambulatory Visit

## 2024-08-24 ENCOUNTER — Ambulatory Visit (INDEPENDENT_AMBULATORY_CARE_PROVIDER_SITE_OTHER)

## 2024-08-24 ENCOUNTER — Encounter

## 2024-08-24 VITALS — BP 114/76 | HR 82 | Temp 97.8°F | Ht 62.5 in | Wt 170.1 lb

## 2024-08-24 DIAGNOSIS — Z23 Encounter for immunization: Secondary | ICD-10-CM

## 2024-09-08 ENCOUNTER — Ambulatory Visit

## 2024-09-08 VITALS — BP 110/76 | HR 91 | Temp 97.7°F | Ht 62.5 in | Wt 171.1 lb

## 2024-09-08 DIAGNOSIS — J111 Influenza due to unidentified influenza virus with other respiratory manifestations: Secondary | ICD-10-CM | POA: Insufficient documentation

## 2024-09-08 MED ORDER — OSELTAMIVIR PHOSPHATE 75 MG PO CAPS: 75.0000 mg | ORAL_CAPSULE | Freq: Two times a day (BID) | ORAL | 0 refills | Status: AC

## 2024-09-08 NOTE — Patient Instructions (Addendum)
"   VISIT SUMMARY: You came in today with flu-like symptoms after being exposed to your husband who tested positive for the flu. You have a mild cough and no fever at present.  YOUR PLAN: INFLUENZA: You likely have an acute influenza infection due to exposure to your husband who has confirmed influenza. -Take Tamiflu  twice daily for 5 days. -Monitor for rare side effects of Tamiflu , such as vivid dreams or hallucinations, and report if they occur. -Wear a mask if you are around others, especially if you have symptoms. -Contact us  if your symptoms worsen or if you develop nausea, as you may need medication for nausea. -Your prescription has been sent to [facility].  If you have any problems before your next visit feel free to message me via MyChart (minor issues or questions) or call the office, otherwise you may reach out to schedule an office visit.  Thank you! Saddie Sacks, PA-C  "

## 2024-09-08 NOTE — Assessment & Plan Note (Addendum)
 Acute influenza infection likely due to exposure to husband with confirmed influenza. Symptoms include mild cough and congestion. Patient agreeable to treating given known exposure and symptoms shortly following instead of paying for additional testing.  - Prescribed Tamiflu  75 mg twice daily for 5 days. - Advised to monitor for rare side effects of Tamiflu , such as vivid dreams or hallucinations, and report if she occurs. - Instructed to wear a mask if around others, especially if symptomatic. - Advised to contact if symptoms worsen or if nausea develops for potential nausea medication.

## 2024-09-08 NOTE — Progress Notes (Signed)
 "  Acute Office Visit  Subjective:     Patient ID: Cassidy Gibson, female    DOB: 1971-04-23, 54 y.o.   MRN: [id number]  Chief Complaint  Patient presents with   Possible Flu Exposure    Onset:     HPI   History of Present Illness    Cassidy Gibson is a 54 year old female who presents with flu-like symptoms after exposure to her husband who tested positive for the flu.  Influenza exposure - Direct household exposure to husband who tested positive for influenza on Sunday - Husband treated with Tamiflu  and had fever of 102.16F after diagnosis - Husband experienced symptom improvement within two to three days of starting Tamiflu   Acute respiratory symptoms - Onset of symptoms began yesterday evening, with increased severity today. Started with itchy throat that has started to become sore today.  - Mild cough present. Body aches.  - No shortness of breath or chest pain  Systemic symptoms - No fever at present - No gastrointestinal symptoms, including stomach upset - Concern about developing high fever similar to husband  Antiviral medication history - No prior use of Tamiflu        ROS Per HPI     Objective:    BP 110/76   Pulse 91   Temp 97.7 F (36.5 C) (Oral)   Ht 5' 2.5 (1.588 m)   Wt 171 lb 1.3 oz (77.6 kg)   SpO2 98%   BMI 30.79 kg/m    Physical Exam Constitutional:      General: She is not in acute distress.    Appearance: Normal appearance.  HENT:     Right Ear: Tympanic membrane normal.     Left Ear: Tympanic membrane normal.     Nose: Rhinorrhea present.     Mouth/Throat:     Pharynx: Posterior oropharyngeal erythema present.  Cardiovascular:     Rate and Rhythm: Normal rate and regular rhythm.     Heart sounds: Normal heart sounds. No murmur heard.    No friction rub. No gallop.  Pulmonary:     Effort: Pulmonary effort is normal. No respiratory distress.     Breath sounds: Normal breath sounds.  Musculoskeletal:        General: No  swelling.     Cervical back: Neck supple.  Lymphadenopathy:     Cervical: No cervical adenopathy.  Skin:    General: Skin is warm and dry.  Neurological:     General: No focal deficit present.     Mental Status: She is alert.  Psychiatric:        Mood and Affect: Mood normal.        Behavior: Behavior normal.        Thought Content: Thought content normal.      No results found for any visits on 09/08/24.      Assessment & Plan:   Influenza Assessment & Plan: Acute influenza infection likely due to exposure to husband with confirmed influenza. Symptoms include mild cough and congestion. Patient agreeable to treating given known exposure and symptoms shortly following instead of paying for additional testing.  - Prescribed Tamiflu  75 mg twice daily for 5 days. - Advised to monitor for rare side effects of Tamiflu , such as vivid dreams or hallucinations, and report if she occurs. - Instructed to wear a mask if around others, especially if symptomatic. - Advised to contact if symptoms worsen or if nausea develops for potential nausea medication.  Other orders -     Oseltamivir  Phosphate; Take 1 capsule (75 mg total) by mouth 2 (two) times daily.  Dispense: 10 capsule; Refill: 0       Return if symptoms worsen or fail to improve.  Saddie JULIANNA Sacks, PA-C  "

## 2024-09-28 ENCOUNTER — Other Ambulatory Visit: Payer: Self-pay

## 2024-09-28 ENCOUNTER — Other Ambulatory Visit

## 2024-09-28 DIAGNOSIS — E1169 Type 2 diabetes mellitus with other specified complication: Secondary | ICD-10-CM

## 2024-09-28 DIAGNOSIS — Z1329 Encounter for screening for other suspected endocrine disorder: Secondary | ICD-10-CM

## 2024-09-28 DIAGNOSIS — Z6831 Body mass index (BMI) 31.0-31.9, adult: Secondary | ICD-10-CM

## 2024-09-28 DIAGNOSIS — E119 Type 2 diabetes mellitus without complications: Secondary | ICD-10-CM

## 2024-10-01 ENCOUNTER — Other Ambulatory Visit

## 2024-10-01 DIAGNOSIS — Z1329 Encounter for screening for other suspected endocrine disorder: Secondary | ICD-10-CM

## 2024-10-01 DIAGNOSIS — Z6831 Body mass index (BMI) 31.0-31.9, adult: Secondary | ICD-10-CM

## 2024-10-01 DIAGNOSIS — E119 Type 2 diabetes mellitus without complications: Secondary | ICD-10-CM

## 2024-10-01 DIAGNOSIS — E1169 Type 2 diabetes mellitus with other specified complication: Secondary | ICD-10-CM

## 2024-10-02 ENCOUNTER — Ambulatory Visit: Payer: Self-pay

## 2024-10-02 LAB — COMPREHENSIVE METABOLIC PANEL WITH GFR
ALT: 23 [IU]/L (ref 0–32)
AST: 22 [IU]/L (ref 0–40)
Albumin: 4.5 g/dL (ref 3.8–4.9)
Alkaline Phosphatase: 92 [IU]/L (ref 49–135)
BUN/Creatinine Ratio: 27 — ABNORMAL HIGH (ref 9–23)
BUN: 17 mg/dL (ref 6–24)
Bilirubin Total: 0.4 mg/dL (ref 0.0–1.2)
CO2: 22 mmol/L (ref 20–29)
Calcium: 9.6 mg/dL (ref 8.7–10.2)
Chloride: 102 mmol/L (ref 96–106)
Creatinine, Ser: 0.63 mg/dL (ref 0.57–1.00)
Globulin, Total: 2.5 g/dL (ref 1.5–4.5)
Glucose: 90 mg/dL (ref 70–99)
Potassium: 4.8 mmol/L (ref 3.5–5.2)
Sodium: 139 mmol/L (ref 134–144)
Total Protein: 7 g/dL (ref 6.0–8.5)
eGFR: 107 mL/min/{1.73_m2}

## 2024-10-02 LAB — LIPID PANEL
Chol/HDL Ratio: 2.7 ratio (ref 0.0–4.4)
Cholesterol, Total: 176 mg/dL (ref 100–199)
HDL: 66 mg/dL
LDL Chol Calc (NIH): 95 mg/dL (ref 0–99)
Triglycerides: 81 mg/dL (ref 0–149)
VLDL Cholesterol Cal: 15 mg/dL (ref 5–40)

## 2024-10-02 LAB — TSH: TSH: 0.766 u[IU]/mL (ref 0.450–4.500)

## 2024-10-02 LAB — HEMOGLOBIN A1C
Est. average glucose Bld gHb Est-mCnc: 120 mg/dL
Hgb A1c MFr Bld: 5.8 % — ABNORMAL HIGH (ref 4.8–5.6)

## 2024-10-05 ENCOUNTER — Encounter

## 2024-11-03 ENCOUNTER — Other Ambulatory Visit
